# Patient Record
Sex: Female | Born: 1980
Health system: Southern US, Community
[De-identification: ages and names within clinical notes are randomized; demographics above are authoritative.]

## PROBLEM LIST (undated history)

## (undated) DIAGNOSIS — F32A Depression, unspecified: Secondary | ICD-10-CM

## (undated) DIAGNOSIS — Z973 Presence of spectacles and contact lenses: Secondary | ICD-10-CM

## (undated) DIAGNOSIS — F329 Major depressive disorder, single episode, unspecified: Secondary | ICD-10-CM

## (undated) DIAGNOSIS — K219 Gastro-esophageal reflux disease without esophagitis: Secondary | ICD-10-CM

## (undated) DIAGNOSIS — F908 Attention-deficit hyperactivity disorder, other type: Secondary | ICD-10-CM

## (undated) DIAGNOSIS — F419 Anxiety disorder, unspecified: Secondary | ICD-10-CM

## (undated) DIAGNOSIS — I10 Essential (primary) hypertension: Secondary | ICD-10-CM

## (undated) HISTORY — PX: TONSILLECTOMY: SUR1361

## (undated) HISTORY — PX: BREAST REDUCTION SURGERY: SHX8

## (undated) HISTORY — DX: Attention-deficit hyperactivity disorder, other type: F90.8

## (undated) HISTORY — DX: Major depressive disorder, single episode, unspecified: F32.9

## (undated) HISTORY — PX: CHOLECYSTECTOMY: SHX55

## (undated) HISTORY — DX: Anxiety disorder, unspecified: F41.9

## (undated) HISTORY — DX: Depression, unspecified: F32.A

---

## 2013-05-11 ENCOUNTER — Inpatient Hospital Stay: Payer: Self-pay

## 2013-05-11 LAB — CBC WITH DIFFERENTIAL/PLATELET
Basophil %: 0.3 %
Eosinophil #: 0.3 10*3/uL (ref 0.0–0.7)
HCT: 30.9 % — ABNORMAL LOW (ref 35.0–47.0)
HGB: 10.1 g/dL — ABNORMAL LOW (ref 12.0–16.0)
Lymphocyte %: 15.4 %
MCH: 25.7 pg — ABNORMAL LOW (ref 26.0–34.0)
MCHC: 32.6 g/dL (ref 32.0–36.0)
MCV: 79 fL — ABNORMAL LOW (ref 80–100)
Monocyte #: 1 x10 3/mm — ABNORMAL HIGH (ref 0.2–0.9)
Monocyte %: 5.8 %
Neutrophil #: 13.6 10*3/uL — ABNORMAL HIGH (ref 1.4–6.5)
Neutrophil %: 76.8 %
Platelet: 464 10*3/uL — ABNORMAL HIGH (ref 150–440)
RBC: 3.92 10*6/uL (ref 3.80–5.20)

## 2013-05-14 LAB — HEMATOCRIT: HCT: 25.5 % — ABNORMAL LOW (ref 35.0–47.0)

## 2014-11-30 ENCOUNTER — Ambulatory Visit: Payer: Self-pay | Admitting: Family Medicine

## 2014-12-02 ENCOUNTER — Ambulatory Visit: Payer: Self-pay | Admitting: Family Medicine

## 2014-12-04 ENCOUNTER — Other Ambulatory Visit: Payer: Self-pay

## 2014-12-07 ENCOUNTER — Ambulatory Visit (INDEPENDENT_AMBULATORY_CARE_PROVIDER_SITE_OTHER): Payer: 59 | Admitting: Family Medicine

## 2014-12-07 ENCOUNTER — Encounter: Payer: Self-pay | Admitting: Family Medicine

## 2014-12-07 VITALS — BP 120/78 | HR 80 | Ht 64.0 in | Wt 199.0 lb

## 2014-12-07 DIAGNOSIS — F900 Attention-deficit hyperactivity disorder, predominantly inattentive type: Secondary | ICD-10-CM | POA: Diagnosis not present

## 2014-12-07 DIAGNOSIS — F419 Anxiety disorder, unspecified: Secondary | ICD-10-CM | POA: Diagnosis not present

## 2014-12-07 DIAGNOSIS — I10 Essential (primary) hypertension: Secondary | ICD-10-CM

## 2014-12-07 DIAGNOSIS — F909 Attention-deficit hyperactivity disorder, unspecified type: Secondary | ICD-10-CM | POA: Insufficient documentation

## 2014-12-07 MED ORDER — ALPRAZOLAM 0.5 MG PO TABS: 0.5000 mg | ORAL_TABLET | Freq: Every day | ORAL | Status: DC

## 2014-12-07 MED ORDER — ADDERALL XR 15 MG PO CP24: 15.0000 mg | ORAL_CAPSULE | Freq: Every day | ORAL | Status: DC

## 2014-12-07 MED ORDER — LISINOPRIL-HYDROCHLOROTHIAZIDE 10-12.5 MG PO TABS: 1.0000 | ORAL_TABLET | Freq: Every day | ORAL | Status: DC

## 2014-12-07 NOTE — Progress Notes (Signed)
Name: Brenda [name]   MRN: [id number]    DOB: [birth year]   Date:12/07/2014       Progress Note  Subjective  Chief Complaint  Chief Complaint  Patient presents with  . Panic Attack  . ADHD  . Hypertension    Hypertension This is a chronic problem. The current episode started more than 1 year ago. The problem has been resolved since onset. Associated symptoms include anxiety. Pertinent negatives include no blurred vision, chest pain, headaches, malaise/fatigue, neck pain, orthopnea, palpitations, peripheral edema, PND, shortness of breath or sweats. Agents associated with hypertension include amphetamines. Risk factors for coronary artery disease include obesity (lost 42 lbs with dietary modification). Past treatments include angiotensin blockers and diuretics. The current treatment provides moderate improvement. There are no compliance problems.  There is no history of angina, kidney disease or CVA. There is no history of chronic renal disease.  Anxiety Presents for follow-up visit. Onset was 1 to 5 years ago. Symptoms include nervous/anxious behavior. Patient reports no chest pain, depressed mood, excessive worry, insomnia, irritability, palpitations, shortness of breath or suicidal ideas. Symptoms occur occasionally. The severity of symptoms is mild. The symptoms are aggravated by work stress. The quality of sleep is good.   There are no known risk factors. Past treatments include benzodiazephines. Compliance with prior treatments has been good.    No problem-specific assessment & plan notes found for this encounter.   Past Medical History  Diagnosis Date  . Anxiety   . ADHD, adult residual type     Past Surgical History  Procedure Laterality Date  . Breast reduction surgery    . Tonsillectomy      Family History  Problem Relation Age of Onset  . Heart disease Maternal Grandmother   . Heart disease Maternal Grandfather   . Diabetes Paternal Grandfather     History    Social History  . Marital Status: Married    Spouse Name: N/A  . Number of Children: N/A  . Years of Education: N/A   Occupational History  . Not on file.   Social History Main Topics  . Smoking status: Never Smoker   . Smokeless tobacco: Not on file  . Alcohol Use: 0.0 oz/week    0 Standard drinks or equivalent per week  . Drug Use: No  . Sexual Activity: Yes    Birth Control/ Protection: Pill   Other Topics Concern  . Not on file   Social History Narrative  . No narrative on file    No Known Allergies   Review of Systems  Constitutional: Negative for fever, chills, malaise/fatigue and irritability.  Eyes: Negative for blurred vision and photophobia.  Respiratory: Negative for shortness of breath.   Cardiovascular: Negative for chest pain, palpitations, orthopnea and PND.  Gastrointestinal: Negative for heartburn, diarrhea and constipation.  Genitourinary: Negative for dysuria, urgency and frequency.  Musculoskeletal: Negative for myalgias and neck pain.  Skin: Negative for itching and rash.  Neurological: Negative.  Negative for headaches.  Endo/Heme/Allergies: Negative.   Psychiatric/Behavioral: Negative for depression, suicidal ideas and substance abuse. The patient is nervous/anxious. The patient does not have insomnia.      Objective  Filed Vitals:   12/07/14 1018  BP: 120/78  Pulse: 80  Height:  (1.626 m)  Weight: 199 lb (90.266 kg)    Physical Exam  Constitutional: She is oriented to person, place, and time and well-developed, well-nourished, and in no distress. No distress.  HENT:  Head: Normocephalic and atraumatic.  Right Ear: External ear normal.  Left Ear: External ear normal.  Nose: Nose normal.  Mouth/Throat: Oropharynx is clear and moist.  Eyes: Conjunctivae and EOM are normal. Pupils are equal, round, and reactive to light. Left eye exhibits no discharge.  Neck: Normal range of motion. Neck supple. No JVD present. No thyromegaly  present.  Cardiovascular: Normal rate, regular rhythm, normal heart sounds and intact distal pulses.  Exam reveals no gallop and no friction rub.   No murmur heard. Pulmonary/Chest: Effort normal and breath sounds normal. No respiratory distress. She has no wheezes.  Abdominal: Soft. Bowel sounds are normal. She exhibits no distension and no mass. There is no tenderness. There is no rebound and no guarding.  Musculoskeletal: Normal range of motion.  Lymphadenopathy:    She has no cervical adenopathy.  Neurological: She is alert and oriented to person, place, and time. She has normal reflexes.  Skin: Skin is warm and dry. She is not diaphoretic.  Psychiatric: Mood and affect normal.      No results found for this or any previous visit (from the past 2160 hour(s)).   Assessment & Plan  Problem List Items Addressed This Visit      Other   Adult ADHD - Primary   Relevant Medications   ADDERALL XR 15 MG 24 hr capsule    Other Visit Diagnoses    Chronic anxiety        Relevant Medications    ALPRAZolam (XANAX) 0.5 MG tablet    Essential hypertension        Relevant Medications    lisinopril-hydrochlorothiazide (PRINZIDE,ZESTORETIC) 10-12.5 MG per tablet         Dr. Hayden [facility]  12/07/2014

## 2014-12-08 LAB — RENAL FUNCTION PANEL
Albumin: 4.5 g/dL (ref 3.5–5.5)
BUN/Creatinine Ratio: 13 (ref 8–20)
BUN: 10 mg/dL (ref 6–20)
CO2: 23 mmol/L (ref 18–29)
Calcium: 9.6 mg/dL (ref 8.7–10.2)
Chloride: 101 mmol/L (ref 97–108)
Creatinine, Ser: 0.78 mg/dL (ref 0.57–1.00)
GFR, EST AFRICAN AMERICAN: 116 mL/min/{1.73_m2} (ref >59)
GFR, EST NON AFRICAN AMERICAN: 100 mL/min/{1.73_m2} (ref >59)
Glucose: 70 mg/dL (ref 65–99)
POTASSIUM: 4.3 mmol/L (ref 3.5–5.2)
Phosphorus: 2.6 mg/dL (ref 2.5–4.5)
Sodium: 140 mmol/L (ref 134–144)

## 2015-01-01 ENCOUNTER — Ambulatory Visit: Payer: Self-pay | Admitting: Family Medicine

## 2015-01-30 ENCOUNTER — Other Ambulatory Visit: Payer: Self-pay | Admitting: Family Medicine

## 2015-01-30 DIAGNOSIS — I10 Essential (primary) hypertension: Secondary | ICD-10-CM

## 2015-03-11 ENCOUNTER — Other Ambulatory Visit: Payer: Self-pay

## 2015-04-09 ENCOUNTER — Ambulatory Visit (INDEPENDENT_AMBULATORY_CARE_PROVIDER_SITE_OTHER): Payer: 59 | Admitting: Family Medicine

## 2015-04-09 ENCOUNTER — Encounter: Payer: Self-pay | Admitting: Family Medicine

## 2015-04-09 VITALS — BP 114/62 | HR 80 | Ht 64.0 in | Wt 208.0 lb

## 2015-04-09 DIAGNOSIS — I10 Essential (primary) hypertension: Secondary | ICD-10-CM

## 2015-04-09 DIAGNOSIS — F32A Depression, unspecified: Secondary | ICD-10-CM

## 2015-04-09 DIAGNOSIS — Z3041 Encounter for surveillance of contraceptive pills: Secondary | ICD-10-CM

## 2015-04-09 DIAGNOSIS — F900 Attention-deficit hyperactivity disorder, predominantly inattentive type: Secondary | ICD-10-CM | POA: Diagnosis not present

## 2015-04-09 DIAGNOSIS — F909 Attention-deficit hyperactivity disorder, unspecified type: Secondary | ICD-10-CM

## 2015-04-09 DIAGNOSIS — F419 Anxiety disorder, unspecified: Secondary | ICD-10-CM | POA: Diagnosis not present

## 2015-04-09 DIAGNOSIS — F329 Major depressive disorder, single episode, unspecified: Secondary | ICD-10-CM

## 2015-04-09 MED ORDER — ESCITALOPRAM OXALATE 20 MG PO TABS: 20.0000 mg | ORAL_TABLET | Freq: Every day | ORAL | Status: DC

## 2015-04-09 MED ORDER — LISINOPRIL-HYDROCHLOROTHIAZIDE 10-12.5 MG PO TABS: 1.0000 | ORAL_TABLET | Freq: Every day | ORAL | Status: DC

## 2015-04-09 MED ORDER — AMPHETAMINE-DEXTROAMPHETAMINE 20 MG PO TABS: 20.0000 mg | ORAL_TABLET | Freq: Two times a day (BID) | ORAL | Status: DC

## 2015-04-09 MED ORDER — ALPRAZOLAM 0.5 MG PO TABS: 0.5000 mg | ORAL_TABLET | Freq: Every day | ORAL | Status: DC

## 2015-04-09 MED ORDER — JOLIVETTE 0.35 MG PO TABS: 1.0000 | ORAL_TABLET | Freq: Every day | ORAL | Status: DC

## 2015-04-09 NOTE — Progress Notes (Signed)
Name: Brenda HalstedVirginia Tate Dimick   MRN: [id number]    DOB: 03/18/1981   Date:04/09/2015       Progress Note  Subjective  Chief Complaint  Chief Complaint  Patient presents with  . ADHD  . Anxiety  . Hypertension    HPI Comments: Follow up ADHD  Anxiety Presents for follow-up visit. Symptoms include nervous/anxious behavior. Patient reports no chest pain, depressed mood, dizziness, excessive worry, insomnia, nausea, palpitations, shortness of breath or suicidal ideas. Symptoms occur most days. The severity of symptoms is mild. The symptoms are aggravated by work stress.   Her past medical history is significant for anxiety/panic attacks.  Hypertension This is a chronic problem. The current episode started more than 1 year ago. The problem has been waxing and waning since onset. Associated symptoms include anxiety. Pertinent negatives include no blurred vision, chest pain, headaches, malaise/fatigue, neck pain, palpitations or shortness of breath. Agents associated with hypertension include amphetamines. Past treatments include ACE inhibitors. There is no history of angina, kidney disease, CAD/MI, CVA, heart failure, left ventricular hypertrophy, PVD, renovascular disease or retinopathy. There is no history of chronic renal disease.    No problem-specific assessment & plan notes found for this encounter.   Past Medical History  Diagnosis Date  . Anxiety   . ADHD, adult residual type   . Depression     Past Surgical History  Procedure Laterality Date  . Breast reduction surgery    . Tonsillectomy      Family History  Problem Relation Age of Onset  . Heart disease Maternal Grandmother   . Heart disease Maternal Grandfather   . Diabetes Paternal Grandfather     Social History   Social History  . Marital Status: Married    Spouse Name: N/A  . Number of Children: N/A  . Years of Education: N/A   Occupational History  . Not on file.   Social History Main Topics  . Smoking  status: Never Smoker   . Smokeless tobacco: Not on file  . Alcohol Use: 0.0 oz/week    0 Standard drinks or equivalent per week  . Drug Use: No  . Sexual Activity: Yes    Birth Control/ Protection: Pill   Other Topics Concern  . Not on file   Social History Narrative    No Known Allergies   Review of Systems  Constitutional: Negative for fever, chills, weight loss and malaise/fatigue.  HENT: Negative for ear discharge, ear pain and sore throat.   Eyes: Negative for blurred vision.  Respiratory: Negative for cough, sputum production, shortness of breath and wheezing.   Cardiovascular: Negative for chest pain, palpitations and leg swelling.  Gastrointestinal: Negative for heartburn, nausea, abdominal pain, diarrhea, constipation, blood in stool and melena.  Genitourinary: Negative for dysuria, urgency, frequency and hematuria.  Musculoskeletal: Negative for myalgias, back pain, joint pain and neck pain.  Skin: Negative for rash.  Neurological: Negative for dizziness, tingling, sensory change, focal weakness and headaches.  Endo/Heme/Allergies: Negative for environmental allergies and polydipsia. Does not bruise/bleed easily.  Psychiatric/Behavioral: Negative for depression and suicidal ideas. The patient is nervous/anxious. The patient does not have insomnia.      Objective  Filed Vitals:   04/09/15 1451  BP: 114/62  Pulse: 80  Height: 5\' 4"  (1.626 m)  Weight: 208 lb (94.348 kg)    Physical Exam  Constitutional: She is well-developed, well-nourished, and in no distress. No distress.  HENT:  Head: Normocephalic and atraumatic.  Right Ear: External ear normal.  Left Ear: External ear normal.  Nose: Nose normal.  Mouth/Throat: Oropharynx is clear and moist.  Eyes: Conjunctivae and EOM are normal. Pupils are equal, round, and reactive to light. Right eye exhibits no discharge. Left eye exhibits no discharge.  Neck: Normal range of motion. Neck supple. No JVD present. No  thyromegaly present.  Cardiovascular: Normal rate, regular rhythm, normal heart sounds and intact distal pulses.  Exam reveals no gallop and no friction rub.   No murmur heard. Pulmonary/Chest: Effort normal and breath sounds normal.  Abdominal: Soft. Bowel sounds are normal. She exhibits no mass. There is no tenderness. There is no guarding.  Musculoskeletal: Normal range of motion. She exhibits no edema.  Lymphadenopathy:    She has no cervical adenopathy.  Neurological: She is alert. She has normal reflexes.  Skin: Skin is warm and dry. She is not diaphoretic.  Psychiatric: Mood and affect normal.      Assessment & Plan  Problem List Items Addressed This Visit      Other   Adult ADHD - Primary   Relevant Medications   amphetamine-dextroamphetamine (ADDERALL) 20 MG tablet    Other Visit Diagnoses    Acute anxiety        Relevant Medications    ALPRAZolam (XANAX) 0.5 MG tablet    escitalopram (LEXAPRO) 20 MG tablet    Essential hypertension        Relevant Medications    lisinopril-hydrochlorothiazide (PRINZIDE,ZESTORETIC) 10-12.5 MG tablet    Depression        Relevant Medications    ALPRAZolam (XANAX) 0.5 MG tablet    escitalopram (LEXAPRO) 20 MG tablet    Chronic anxiety        Relevant Medications    ALPRAZolam (XANAX) 0.5 MG tablet    escitalopram (LEXAPRO) 20 MG tablet    Encounter for surveillance of contraceptive pills        Relevant Medications    JOLIVETTE 0.35 MG tablet         Dr. [facility]den [facility]  04/09/2015

## 2015-05-06 ENCOUNTER — Other Ambulatory Visit: Payer: Self-pay

## 2015-06-07 ENCOUNTER — Other Ambulatory Visit: Payer: Self-pay

## 2015-06-25 ENCOUNTER — Encounter: Payer: Self-pay | Admitting: Family Medicine

## 2015-06-25 ENCOUNTER — Ambulatory Visit (INDEPENDENT_AMBULATORY_CARE_PROVIDER_SITE_OTHER): Payer: 59 | Admitting: Family Medicine

## 2015-06-25 VITALS — BP 120/80 | HR 78 | Ht 64.0 in | Wt 208.0 lb

## 2015-06-25 DIAGNOSIS — J0100 Acute maxillary sinusitis, unspecified: Secondary | ICD-10-CM

## 2015-06-25 DIAGNOSIS — J40 Bronchitis, not specified as acute or chronic: Secondary | ICD-10-CM | POA: Diagnosis not present

## 2015-06-25 MED ORDER — AMOXICILLIN 500 MG PO CAPS: 500.0000 mg | ORAL_CAPSULE | Freq: Three times a day (TID) | ORAL | Status: DC

## 2015-06-25 MED ORDER — BENZONATATE 100 MG PO CAPS: 100.0000 mg | ORAL_CAPSULE | Freq: Two times a day (BID) | ORAL | Status: DC | PRN

## 2015-06-25 NOTE — Progress Notes (Signed)
Name: Rae HalstedVirginia Tate Dimick   MRN: [id number]    DOB: 09/30/1980   Date:06/25/2015       Progress Note  Subjective  Chief Complaint  Chief Complaint  Patient presents with  . Sinusitis    cong and drainage- started Monday- daughter has been sick    Sinusitis This is a new problem. The current episode started in the past 7 days. The problem has been waxing and waning since onset. The maximum temperature recorded prior to her arrival was 100.4 - 100.9 F. Her pain is at a severity of 5/10. The pain is moderate. Associated symptoms include chills, congestion, coughing, diaphoresis, headaches, a hoarse voice, sinus pressure, a sore throat and swollen glands. Pertinent negatives include no ear pain, neck pain, shortness of breath or sneezing. Past treatments include acetaminophen. The treatment provided moderate relief.    No problem-specific assessment & plan notes found for this encounter.   Past Medical History  Diagnosis Date  . Anxiety   . ADHD, adult residual type   . Depression     Past Surgical History  Procedure Laterality Date  . Breast reduction surgery    . Tonsillectomy      Family History  Problem Relation Age of Onset  . Heart disease Maternal Grandmother   . Heart disease Maternal Grandfather   . Diabetes Paternal Grandfather     Social History   Social History  . Marital Status: Married    Spouse Name: N/A  . Number of Children: N/A  . Years of Education: N/A   Occupational History  . Not on file.   Social History Main Topics  . Smoking status: Never Smoker   . Smokeless tobacco: Not on file  . Alcohol Use: 0.0 oz/week    0 Standard drinks or equivalent per week  . Drug Use: No  . Sexual Activity: Yes    Birth Control/ Protection: Pill   Other Topics Concern  . Not on file   Social History Narrative    No Known Allergies   Review of Systems  Constitutional: Positive for chills and diaphoresis. Negative for fever, weight loss and  malaise/fatigue.  HENT: Positive for congestion, hoarse voice, sinus pressure and sore throat. Negative for ear discharge, ear pain and sneezing.   Eyes: Negative for blurred vision.  Respiratory: Positive for cough. Negative for sputum production, shortness of breath and wheezing.   Cardiovascular: Negative for chest pain, palpitations and leg swelling.  Gastrointestinal: Negative for heartburn, nausea, abdominal pain, diarrhea, constipation, blood in stool and melena.  Genitourinary: Negative for dysuria, urgency, frequency and hematuria.  Musculoskeletal: Negative for myalgias, back pain, joint pain and neck pain.  Skin: Negative for rash.  Neurological: Positive for headaches. Negative for dizziness, tingling, sensory change and focal weakness.  Endo/Heme/Allergies: Negative for environmental allergies and polydipsia. Does not bruise/bleed easily.  Psychiatric/Behavioral: Negative for depression and suicidal ideas. The patient is not nervous/anxious and does not have insomnia.      Objective  Filed Vitals:   06/25/15 1016  BP: 120/80  Pulse: 78  Height: 5\' 4"  (1.626 m)  Weight: 208 lb (94.348 kg)    Physical Exam  Constitutional: She is well-developed, well-nourished, and in no distress. No distress.  HENT:  Head: Normocephalic and atraumatic.  Right Ear: External ear normal.  Left Ear: External ear normal.  Nose: Nose normal.  Mouth/Throat: Oropharynx is clear and moist.  Eyes: Conjunctivae and EOM are normal. Pupils are equal, round, and reactive to light. Right eye  exhibits no discharge. Left eye exhibits no discharge.  Neck: Normal range of motion. Neck supple. No JVD present. No thyromegaly present.  Cardiovascular: Normal rate, regular rhythm, normal heart sounds and intact distal pulses.  Exam reveals no gallop and no friction rub.   No murmur heard. Pulmonary/Chest: Effort normal and breath sounds normal.  Abdominal: Soft. Bowel sounds are normal. She exhibits no  mass. There is no tenderness. There is no guarding.  Musculoskeletal: Normal range of motion. She exhibits no edema.  Lymphadenopathy:    She has no cervical adenopathy.  Neurological: She is alert. She has normal reflexes.  Skin: Skin is warm and dry. She is not diaphoretic.  Psychiatric: Mood and affect normal.  Nursing note and vitals reviewed.     Assessment & Plan  Problem List Items Addressed This Visit    None    Visit Diagnoses    Acute maxillary sinusitis, recurrence not specified    -  Primary    Relevant Medications    benzonatate (TESSALON) 100 MG capsule    amoxicillin (AMOXIL) 500 MG capsule    Bronchitis        Relevant Medications    amoxicillin (AMOXIL) 500 MG capsule         Dr. [facility]  06/25/2015

## 2015-11-05 ENCOUNTER — Other Ambulatory Visit: Payer: Self-pay | Admitting: Family Medicine

## 2015-12-05 ENCOUNTER — Other Ambulatory Visit: Payer: Self-pay | Admitting: Family Medicine

## 2015-12-17 ENCOUNTER — Ambulatory Visit (INDEPENDENT_AMBULATORY_CARE_PROVIDER_SITE_OTHER): Payer: 59 | Admitting: Family Medicine

## 2015-12-17 ENCOUNTER — Encounter: Payer: Self-pay | Admitting: Family Medicine

## 2015-12-17 VITALS — BP 130/100 | HR 80 | Ht 64.0 in | Wt 226.0 lb

## 2015-12-17 DIAGNOSIS — J0100 Acute maxillary sinusitis, unspecified: Secondary | ICD-10-CM

## 2015-12-17 DIAGNOSIS — T783XXA Angioneurotic edema, initial encounter: Secondary | ICD-10-CM | POA: Diagnosis not present

## 2015-12-17 DIAGNOSIS — I10 Essential (primary) hypertension: Secondary | ICD-10-CM | POA: Diagnosis not present

## 2015-12-17 DIAGNOSIS — L309 Dermatitis, unspecified: Secondary | ICD-10-CM

## 2015-12-17 DIAGNOSIS — H109 Unspecified conjunctivitis: Secondary | ICD-10-CM | POA: Diagnosis not present

## 2015-12-17 MED ORDER — HYDROCHLOROTHIAZIDE 12.5 MG PO TABS: 12.5000 mg | ORAL_TABLET | Freq: Every day | ORAL | Status: DC

## 2015-12-17 MED ORDER — PREDNISONE 10 MG PO TABS: 10.0000 mg | ORAL_TABLET | Freq: Every day | ORAL | Status: DC

## 2015-12-17 MED ORDER — CLOTRIMAZOLE-BETAMETHASONE 1-0.05 % EX CREA: 1.0000 "application " | TOPICAL_CREAM | Freq: Two times a day (BID) | CUTANEOUS | Status: DC

## 2015-12-17 MED ORDER — DOXYCYCLINE HYCLATE 100 MG PO TABS: 100.0000 mg | ORAL_TABLET | Freq: Two times a day (BID) | ORAL | Status: DC

## 2015-12-17 NOTE — Patient Instructions (Signed)
Angioedema  Angioedema is a sudden swelling of tissues, often of the skin. It can occur on the face or genitals or in the abdomen or other body parts. The swelling usually develops over a short period and gets better in 24 to 48 hours. It often begins during the night and is found when the person wakes up. The person may also get red, itchy patches of skin (hives). Angioedema can be dangerous if it involves swelling of the air passages.   Depending on the cause, episodes of angioedema may only happen once, come back in unpredictable patterns, or repeat for several years and then gradually fade away.   CAUSES   Angioedema can be caused by an allergic reaction to various triggers. It can also result from nonallergic causes, including reactions to drugs, immune system disorders, viral infections, or an abnormal gene that is passed to you from your parents (hereditary). For some people with angioedema, the cause is unknown.   Some things that can trigger angioedema include:    Foods.    Medicines, such as ACE inhibitors, ARBs, nonsteroidal anti-inflammatory agents, or estrogen.    Latex.    Animal saliva.    Insect stings.    Dyes used in X-rays.    Mild injury.    Dental work.   Surgery.   Stress.    Sudden changes in temperature.    Exercise.  SIGNS AND SYMPTOMS    Swelling of the skin.   Hives. If these are present, there is also intense itching.   Redness in the affected area.    Pain in the affected area.   Swollen lips or tongue.   Breathing problems. This may happen if the air passages swell.   Wheezing.  If internal organs are involved, there may be:    Nausea.    Abdominal pain.    Vomiting.    Difficulty swallowing.    Difficulty passing urine.  DIAGNOSIS    Your health care provider will examine the affected area and take a medical and family history.   Various tests may be done to help determine the cause. Tests may include:   Allergy skin tests to see if the problem  is an allergic reaction.    Blood tests to check for hereditary angioedema.    Tests to check for underlying diseases that could cause the condition.    A review of your medicines, including over-the-counter medicines, may be done.  TREATMENT   Treatment will depend on the cause of the angioedema. Possible treatments include:    Removal of anything that triggered the condition (such as stopping certain medicines).    Medicines to treat symptoms or prevent attacks. Medicines given may include:     Antihistamines.     Epinephrine injection.     Steroids.    Hospitalization may be required for severe attacks. If the air passages are affected, it can be an emergency. Tubes may need to be placed to keep the airway open.  HOME CARE INSTRUCTIONS    Take all medicines as directed by your health care provider.   If you were given medicines for emergency allergy treatment, always carry them with you.   Wear a medical bracelet as directed by your health care provider.    Avoid known triggers.  SEEK MEDICAL CARE IF:    You have repeat attacks of angioedema.    Your attacks are more frequent or more severe despite preventive measures.    You have hereditary angioedema   and are considering having children. It is important to discuss with your health care provider the risks of passing the condition on to your children.  SEEK IMMEDIATE MEDICAL CARE IF:    You have severe swelling of the mouth, tongue, or lips.   You have difficulty breathing.    You have difficulty swallowing.    You faint.  MAKE SURE YOU:   Understand these instructions.   Will watch your condition.   Will get help right away if you are not doing well or get worse.     This information is not intended to replace advice given to you by your health care provider. Make sure you discuss any questions you have with your health care provider.     Document Released: 08/21/2001 Document Revised: 07/03/2014 Document Reviewed:  02/03/2013  [employer] Interactive Patient Education 2016 [employer]

## 2015-12-17 NOTE — Progress Notes (Signed)
Name: Brenda [name]: [id number]    DOB: 05/31/1981   Date:12/17/2015       Progress Note  Subjective  Chief Complaint  Chief Complaint  Patient presents with  . Sinusitis    cough and cong- wants Tessalon Perles instead of cough syrup  . Allergic Reaction    lip and face swelling/ taking Lisinopril    Sinusitis This is a new problem. The current episode started in the past 7 days. The problem has been gradually worsening since onset. There has been no fever. Associated symptoms include chills, congestion, sinus pressure and a sore throat. Pertinent negatives include no coughing, ear pain, headaches, neck pain or shortness of breath. Past treatments include acetaminophen. The treatment provided no relief.  Allergic Reaction This is a new problem. The current episode started more than 1 week ago. The problem has been waxing and waning since onset. The problem is mild. The patient was exposed to an antibiotic. Associated symptoms include eye redness. Pertinent negatives include no abdominal pain, chest pain, chest pressure, coughing, diarrhea, difficulty breathing, drooling, eye itching, eye watering, hyperventilation, itching, rash, stridor, trouble swallowing or wheezing. Swelling is present on the lips. The treatment provided mild relief.    No problem-specific assessment & plan notes found for this encounter.   Past Medical History  Diagnosis Date  . Anxiety   . ADHD, adult residual type   . Depression     Past Surgical History  Procedure Laterality Date  . Breast reduction surgery    . Tonsillectomy      Family History  Problem Relation Age of Onset  . Heart disease Maternal Grandmother   . Heart disease Maternal Grandfather   . Diabetes Paternal Grandfather     Social History   Social History  . Marital Status: Married    Spouse Name: N/A  . Number of Children: N/A  . Years of Education: N/A   Occupational History  . Not on file.   Social History  Main Topics  . Smoking status: Never Smoker   . Smokeless tobacco: Not on file  . Alcohol Use: 0.0 oz/week    0 Standard drinks or equivalent per week  . Drug Use: No  . Sexual Activity: Yes    Birth Control/ Protection: Pill   Other Topics Concern  . Not on file   Social History Narrative    Allergies  Allergen Reactions  . Ace Inhibitors Swelling    Lips/ face     Review of Systems  Constitutional: Positive for chills. Negative for fever, weight loss and malaise/fatigue.  HENT: Positive for congestion, sinus pressure and sore throat. Negative for drooling, ear discharge, ear pain and trouble swallowing.   Eyes: Positive for redness. Negative for blurred vision and itching.  Respiratory: Negative for cough, sputum production, shortness of breath, wheezing and stridor.   Cardiovascular: Negative for chest pain, palpitations and leg swelling.  Gastrointestinal: Negative for heartburn, nausea, abdominal pain, diarrhea, constipation, blood in stool and melena.  Genitourinary: Negative for dysuria, urgency, frequency and hematuria.  Musculoskeletal: Negative for myalgias, back pain, joint pain and neck pain.  Skin: Negative for itching and rash.  Neurological: Negative for dizziness, tingling, sensory change, focal weakness and headaches.  Endo/Heme/Allergies: Negative for environmental allergies and polydipsia. Does not bruise/bleed easily.  Psychiatric/Behavioral: Negative for depression and suicidal ideas. The patient is not nervous/anxious and does not have insomnia.      Objective  Filed Vitals:   12/17/15 1455  BP: 130/100  Pulse: 80  Height: 5\' 4"  (1.626 m)  Weight: 226 lb (102.513 kg)    Physical Exam  Constitutional: She is well-developed, well-nourished, and in no distress. No distress.  HENT:  Head: Normocephalic and atraumatic.  Right Ear: External ear normal.  Left Ear: External ear normal.  Nose: Nose normal.  Mouth/Throat: Oropharynx is clear and  moist.  Eyes: Conjunctivae and EOM are normal. Pupils are equal, round, and reactive to light. Right eye exhibits no discharge. Left eye exhibits no discharge.  Neck: Normal range of motion. Neck supple. No JVD present. No thyromegaly present.  Cardiovascular: Normal rate, regular rhythm, normal heart sounds and intact distal pulses.  Exam reveals no gallop and no friction rub.   No murmur heard. Pulmonary/Chest: Effort normal and breath sounds normal.  Abdominal: Soft. Bowel sounds are normal. She exhibits no mass. There is no tenderness. There is no guarding.  Musculoskeletal: Normal range of motion. She exhibits no edema.  Lymphadenopathy:    She has no cervical adenopathy.  Neurological: She is alert. She has normal reflexes.  Skin: Skin is warm and dry. Rash noted. She is not diaphoretic.  Psychiatric: Mood and affect normal.  Nursing note and vitals reviewed.     Assessment & Plan  Problem List Items Addressed This Visit    None    Visit Diagnoses    Angioedema, initial encounter    -  Primary    likely lisinopril/ obtain Biotin    Eczema        Relevant Medications    clotrimazole-betamethasone (LOTRISONE) cream    Acute maxillary sinusitis, recurrence not specified        Relevant Medications    doxycycline (VIBRA-TABS) 100 MG tablet    predniSONE (DELTASONE) 10 MG tablet    Bilateral conjunctivitis        Essential hypertension        Relevant Medications    hydrochlorothiazide (HYDRODIURIL) 12.5 MG tablet         Dr. Hayden Rasmusseneanna Henrine Hayter [facility]  12/17/2015

## 2016-01-04 ENCOUNTER — Other Ambulatory Visit: Payer: Self-pay | Admitting: Family Medicine

## 2016-02-02 ENCOUNTER — Other Ambulatory Visit: Payer: Self-pay

## 2016-02-02 MED ORDER — CLOTRIMAZOLE-BETAMETHASONE 1-0.05 % EX CREA: 1.0000 "application " | TOPICAL_CREAM | Freq: Two times a day (BID) | CUTANEOUS | 0 refills | Status: DC

## 2016-02-08 ENCOUNTER — Other Ambulatory Visit: Payer: Self-pay | Admitting: Family Medicine

## 2016-02-08 DIAGNOSIS — I10 Essential (primary) hypertension: Secondary | ICD-10-CM

## 2016-02-27 ENCOUNTER — Other Ambulatory Visit: Payer: Self-pay | Admitting: Family Medicine

## 2016-08-09 ENCOUNTER — Ambulatory Visit: Payer: 59 | Admitting: Family Medicine

## 2016-09-11 ENCOUNTER — Ambulatory Visit: Payer: 59 | Admitting: Obstetrics and Gynecology

## 2016-09-12 ENCOUNTER — Ambulatory Visit (INDEPENDENT_AMBULATORY_CARE_PROVIDER_SITE_OTHER): Payer: 59 | Admitting: Obstetrics and Gynecology

## 2016-09-12 ENCOUNTER — Encounter: Payer: Self-pay | Admitting: Obstetrics and Gynecology

## 2016-09-12 DIAGNOSIS — K219 Gastro-esophageal reflux disease without esophagitis: Secondary | ICD-10-CM | POA: Diagnosis not present

## 2016-09-12 DIAGNOSIS — Z124 Encounter for screening for malignant neoplasm of cervix: Secondary | ICD-10-CM | POA: Diagnosis not present

## 2016-09-12 DIAGNOSIS — Z6841 Body Mass Index (BMI) 40.0 and over, adult: Secondary | ICD-10-CM | POA: Diagnosis not present

## 2016-09-12 DIAGNOSIS — F411 Generalized anxiety disorder: Secondary | ICD-10-CM | POA: Diagnosis not present

## 2016-09-12 DIAGNOSIS — Z0000 Encounter for general adult medical examination without abnormal findings: Secondary | ICD-10-CM | POA: Diagnosis not present

## 2016-09-12 DIAGNOSIS — Z01419 Encounter for gynecological examination (general) (routine) without abnormal findings: Secondary | ICD-10-CM | POA: Diagnosis not present

## 2016-09-12 MED ORDER — ESCITALOPRAM OXALATE 20 MG PO TABS: 20.0000 mg | ORAL_TABLET | Freq: Every day | ORAL | 2 refills | Status: DC

## 2016-09-12 MED ORDER — LORCASERIN HCL ER 20 MG PO TB24: 1.0000 | ORAL_TABLET | Freq: Every day | ORAL | 2 refills | Status: DC

## 2016-09-12 MED ORDER — RANITIDINE HCL 150 MG PO TABS: 150.0000 mg | ORAL_TABLET | Freq: Two times a day (BID) | ORAL | 11 refills | Status: DC

## 2016-09-12 MED ORDER — HYDROXYZINE HCL 50 MG PO TABS: 50.0000 mg | ORAL_TABLET | Freq: Four times a day (QID) | ORAL | 3 refills | Status: DC | PRN

## 2016-09-12 MED ORDER — NORETHINDRONE 0.35 MG PO TABS: 1.0000 | ORAL_TABLET | Freq: Every day | ORAL | 11 refills | Status: DC

## 2016-09-12 NOTE — Progress Notes (Signed)
Patient ID: Brenda [name], female   DOB: 08/22/1980, 36 y.o.   MRN: [id number]     Gynecology Annual Exam  PCP: Elizabeth Sauer, MD  Chief Complaint:  Chief Complaint  Patient presents with  . Gynecologic Exam  . Obesity    ?? lap-band    History of Present Illness: Patient is a 36 y.o. G2P1011 presents for annual exam. The patient has no complaints today.   LMP: No LMP recorded. Patient is not currently having periods (Reason: Oral contraceptives). Amenorrhea on norethindrone Dysmenorrhea: no  The patient is sexually active. She currently uses Oral contraceptives: Present: yes for contraception. She denies dyspareunia.  The patient does perform self breast exams.  There is no notable family history of breast or ovarian cancer in her family.  The patient wears seatbelts: yes.   The patient has regular exercise: no.    The patient denies current symptoms of depression.     Patientis a 36 y.o. G56P1011 female, who presents for the evaluation of weight gain. She has gained 10 pounds primarily over past year (was on seroquel). The patient states the following issues have contributed to her weight problem: inactivity, on the road a lot and poor diet choices.  The patient has no additional symptoms. The patient specifically denies memory loss, muscle weakness, excessive thirst, and polyuria. Weight related co-morbidities include HTN. The patient's past medical history is notable for HTN, ADHD on aderal currently. She has tried phentermine interventions in the past with modest success.   Review of Systems: Review of Systems  Constitutional: Negative for chills and fever.  HENT: Negative for congestion.   Respiratory: Negative for cough and shortness of breath.   Cardiovascular: Negative for chest pain and palpitations.  Gastrointestinal: Negative for abdominal pain, constipation, diarrhea, heartburn, nausea and vomiting.  Genitourinary: Negative for dysuria, frequency and urgency.    Skin: Negative for itching and rash.  Neurological: Negative for dizziness and headaches.  Endo/Heme/Allergies: Negative for polydipsia.  Psychiatric/Behavioral: Negative for depression, hallucinations and suicidal ideas. The patient is nervous/anxious. The patient does not have insomnia.     Past Medical History:  Past Medical History:  Diagnosis Date  . ADHD, adult residual type   . Anxiety   . Depression     Past Surgical History:  Past Surgical History:  Procedure Laterality Date  . BREAST REDUCTION SURGERY    . TONSILLECTOMY      Gynecologic History:  No LMP recorded. Patient is not currently having periods (Reason: Oral contraceptives). Contraception: oral progesterone-only contraceptive Last Pap: Results were: NIL and HR HPV negative   Obstetric History: G2P1011  Family History:  Family History  Problem Relation Age of Onset  . Heart disease Maternal Grandmother   . Heart disease Maternal Grandfather   . Diabetes Paternal Grandfather     Social History:  Social History   Social History  . Marital status: Married    Spouse name: N/A  . Number of children: N/A  . Years of education: N/A   Occupational History  . Not on file.   Social History Main Topics  . Smoking status: Never Smoker  . Smokeless tobacco: Never Used  . Alcohol use 0.0 oz/week  . Drug use: No  . Sexual activity: Yes    Birth control/ protection: Pill   Other Topics Concern  . Not on file   Social History Narrative  . No narrative on file    Allergies:  Allergies  Allergen Reactions  . Ace Inhibitors Swelling  Lips/ face    Medications: Prior to Admission medications   Medication Sig Start Date End Date Taking? Authorizing Provider  ADDERALL XR 20 MG 24 hr capsule Take 1 capsule by mouth daily. Dr Evelene Croon 11/23/15   Historical Provider, MD  amphetamine-dextroamphetamine (ADDERALL) 20 MG tablet Take 1 tablet (20 mg total) by mouth 2 (two) times daily. 04/09/15   Duanne Limerick, MD  hydrochlorothiazide (HYDRODIURIL) 12.5 MG tablet Take 1 tablet (12.5 mg total) by mouth daily. 12/17/15   Duanne Limerick, MD  JOLIVETTE 0.35 MG tablet TAKE 1 TABLET BY MOUTH EVERY DAY 02/29/16   Duanne Limerick, MD    Physical Exam Vitals: Blood pressure (!) 142/98, pulse 62, height 5\' 4"  (1.626 m), weight 238 lb (108 kg).[+10lbs] Body mass index is 40.85 kg/m.   General: NAD HEENT: normocephalic, anicteric Thyroid: no enlargement, no palpable nodules Pulmonary: No increased work of breathing, CTAB Cardiovascular: RRR, distal pulses 2+ Breast: Breast symmetrical, no tenderness, no palpable nodules or masses, no skin or nipple retraction present, no nipple discharge.  No axillary or supraclavicular lymphadenopathy. Abdomen: NABS, soft, non-tender, non-distended.  Umbilicus without lesions.  No hepatomegaly, splenomegaly or masses palpable. No evidence of hernia  Genitourinary:  External: Normal external female genitalia.  Normal urethral meatus, normal  Bartholin's and Skene's glands.    Vagina: Normal vaginal mucosa, no evidence of prolapse.    Cervix: Grossly normal in appearance, no bleeding  Uterus: Non-enlarged, mobile, normal contour.  No CMT  Adnexa: ovaries non-enlarged, no adnexal masses  Rectal: deferred  Lymphatic: no evidence of inguinal lymphadenopathy Extremities: no edema, erythema, or tenderness Neurologic: Grossly intact Psychiatric: mood appropriate, affect full  Female chaperone present for pelvic and breast  portions of the physical exam    Assessment: 36 y.o. O9G2952 No problem-specific Assessment & Plan notes found for this encounter.   Plan: Problem List Items Addressed This Visit    None    Visit Diagnoses    Morbid obesity (HCC)    -  Primary   Relevant Medications   Lorcaserin HCl ER (BELVIQ XR) 20 MG TB24   Other Relevant Orders   Ambulatory referral to General Surgery   BMI 40.0-44.9, adult (HCC)       Relevant Medications    Lorcaserin HCl ER (BELVIQ XR) 20 MG TB24   Other Relevant Orders   Ambulatory referral to General Surgery   Generalized anxiety disorder       Gastroesophageal reflux disease, esophagitis presence not specified       Relevant Medications   ranitidine (ZANTAC) 150 MG tablet   Screening for malignant neoplasm of cervix       Relevant Orders   PapIG, HPV, rfx 16/18   Encounter for gynecological examination without abnormal finding       Relevant Orders   PapIG, HPV, rfx 16/18     Annual 1) STI screening was offered and declined  2) ASCCP guidelines and rational discussed.  Patient opts for every 5 years screening interval  3) Contraception - Education given regarding options for contraception, including oral contraceptives.  4) Routine healthcare maintenance including cholesterol, diabetes screening discussed managed by PCP  5) Start Zantac for GERD  5) PHQ-9 is 11 item 9 is 0, GAD-7 16 increase lexapro to 20mg  add prn vistaril  6) Follow up 1 year for routine annual exam  Weight loss 1) 1500 Calorie ADA Diet  2) Patient education given regarding appropriate lifestyle changes for weight loss including: regular physical  activity, healthy coping strategies, caloric restriction and healthy eating patterns.  3) Patient will be started on weight loss medication. The risks and benefits and side effects of medication, such as Adipex (Phenteramine) ,  Tenuate (Diethylproprion), Belviq (lorcarsin), Contrave (buproprion/naltrexone), Qsymia (phentermine/topiramate), and Saxenda (liraglutide) is discussed. The pros and cons of suppressing appetite and boosting metabolism is discussed. Risks of tolerence and addiction is discussed for selected agents discussed. Use of medicine will ne short term, such as 3-4 months at a time followed by a period of time off of the medicine to avoid these risks and side effects for Adipex, Qsymia, and Tenuate discussed. Pt to call with any negative side effects and  agrees to keep follow up appts.  Started on Belviq today referral to bariatric surgery  4) Comorbidity Screening - hypothyroidism screening, diabetes, and hyperlipidemia screening offered  5) Encouraged weekly weight monitorig to track progress and sample 1 week food diary  6) Contraception - discussed that all weight loss drugs fall in to pregnancy category X, patient currently has reliable contraception in the form of norethindrone  7) 15 minutes face-to-face; counseling/coordination of care > 50 percent of visit  8) Follow up in 12 weeks to assess response

## 2016-09-12 NOTE — Patient Instructions (Addendum)
Calorie Counting for Weight Loss Calories are units of energy. Your body needs a certain amount of calories from food to keep you going throughout the day. When you eat more calories than your body needs, your body stores the extra calories as fat. When you eat fewer calories than your body needs, your body burns fat to get the energy it needs. Calorie counting means keeping track of how many calories you eat and drink each day. Calorie counting can be helpful if you need to lose weight. If you make sure to eat fewer calories than your body needs, you should lose weight. Ask your health care provider what a healthy weight is for you. For calorie counting to work, you will need to eat the right number of calories in a day in order to lose a healthy amount of weight per week. A dietitian can help you determine how many calories you need in a day and will give you suggestions on how to reach your calorie goal.  A healthy amount of weight to lose per week is usually 1-2 lb (0.5-0.9 kg). This usually means that your daily calorie intake should be reduced by 500-750 calories.  Eating 1,200 - 1,500 calories per day can help most women lose weight.  Eating 1,500 - 1,800 calories per day can help most men lose weight. What is my plan? My goal is to have __________ calories per day. If I have this many calories per day, I should lose around __________ pounds per week. What do I need to know about calorie counting? In order to meet your daily calorie goal, you will need to:  Find out how many calories are in each food you would like to eat. Try to do this before you eat.  Decide how much of the food you plan to eat.  Write down what you ate and how many calories it had. Doing this is called keeping a food log. To successfully lose weight, it is important to balance calorie counting with a healthy lifestyle that includes regular activity. Aim for 150 minutes of moderate exercise (such as walking) or 75  minutes of vigorous exercise (such as running) each week. Where do I find calorie information?   The number of calories in a food can be found on a Nutrition Facts label. If a food does not have a Nutrition Facts label, try to look up the calories online or ask your dietitian for help. Remember that calories are listed per serving. If you choose to have more than one serving of a food, you will have to multiply the calories per serving by the amount of servings you plan to eat. For example, the label on a package of bread might say that a serving size is 1 slice and that there are 90 calories in a serving. If you eat 1 slice, you will have eaten 90 calories. If you eat 2 slices, you will have eaten 180 calories. How do I keep a food log? Immediately after each meal, record the following information in your food log:  What you ate. Don't forget to include toppings, sauces, and other extras on the food.  How much you ate. This can be measured in cups, ounces, or number of items.  How many calories each food and drink had.  The total number of calories in the meal. Keep your food log near you, such as in a small notebook in your pocket, or use a mobile app or website. Some programs will   calculate calories for you and show you how many calories you have left for the day to meet your goal. What are some calorie counting tips?  Use your calories on foods and drinks that will fill you up and not leave you hungry:  Some examples of foods that fill you up are nuts and nut butters, vegetables, lean proteins, and high-fiber foods like whole grains. High-fiber foods are foods with more than 5 g fiber per serving.  Drinks such as sodas, specialty coffee drinks, alcohol, and juices have a lot of calories, yet do not fill you up.  Eat nutritious foods and avoid empty calories. Empty calories are calories you get from foods or beverages that do not have many vitamins or protein, such as candy, sweets, and  soda. It is better to have a nutritious high-calorie food (such as an avocado) than a food with few nutrients (such as a bag of chips).  Know how many calories are in the foods you eat most often. This will help you calculate calorie counts faster.  Pay attention to calories in drinks. Low-calorie drinks include water and unsweetened drinks.  Pay attention to nutrition labels for "low fat" or "fat free" foods. These foods sometimes have the same amount of calories or more calories than the full fat versions. They also often have added sugar, starch, or salt, to make up for flavor that was removed with the fat.  Find a way of tracking calories that works for you. Get creative. Try different apps or programs if writing down calories does not work for you. What are some portion control tips?  Know how many calories are in a serving. This will help you know how many servings of a certain food you can have.  Use a measuring cup to measure serving sizes. You could also try weighing out portions on a kitchen scale. With time, you will be able to estimate serving sizes for some foods.  Take some time to put servings of different foods on your favorite plates, bowls, and cups so you know what a serving looks like.  Try not to eat straight from a bag or box. Doing this can lead to overeating. Put the amount you would like to eat in a cup or on a plate to make sure you are eating the right portion.  Use smaller plates, glasses, and bowls to prevent overeating.  Try not to multitask (for example, watch TV or use your computer) while eating. If it is time to eat, sit down at a table and enjoy your food. This will help you to know when you are full. It will also help you to be aware of what you are eating and how much you are eating. What are tips for following this plan? Reading food labels   Check the calorie count compared to the serving size. The serving size may be smaller than what you are used to  eating.  Check the source of the calories. Make sure the food you are eating is high in vitamins and protein and low in saturated and trans fats. Shopping   Read nutrition labels while you shop. This will help you make healthy decisions before you decide to purchase your food.  Make a grocery list and stick to it. Cooking   Try to cook your favorite foods in a healthier way. For example, try baking instead of frying.  Use low-fat dairy products. Meal planning   Use more fruits and vegetables. Half of your   plate should be fruits and vegetables.  Include lean proteins like poultry and fish. How do I count calories when eating out?  Ask for smaller portion sizes.  Consider sharing an entree and sides instead of getting your own entree.  If you get your own entree, eat only half. Ask for a box at the beginning of your meal and put the rest of your entree in it so you are not tempted to eat it.  If calories are listed on the menu, choose the lower calorie options.  Choose dishes that include vegetables, fruits, whole grains, low-fat dairy products, and lean protein.  Choose items that are boiled, broiled, grilled, or steamed. Stay away from items that are buttered, battered, fried, or served with cream sauce. Items labeled "crispy" are usually fried, unless stated otherwise.  Choose water, low-fat milk, unsweetened iced tea, or other drinks without added sugar. If you want an alcoholic beverage, choose a lower calorie option such as a glass of wine or light beer.  Ask for dressings, sauces, and syrups on the side. These are usually high in calories, so you should limit the amount you eat.  If you want a salad, choose a garden salad and ask for grilled meats. Avoid extra toppings like bacon, cheese, or fried items. Ask for the dressing on the side, or ask for olive oil and vinegar or lemon to use as dressing.  Estimate how many servings of a food you are given. For example, a serving  of cooked rice is  cup or about the size of half a baseball. Knowing serving sizes will help you be aware of how much food you are eating at restaurants. The list below tells you how big or small some common portion sizes are based on everyday objects:  1 oz-4 stacked dice.  3 oz-1 deck of cards.  1 tsp-1 die.  1 Tbsp- a ping-pong ball.  2 Tbsp-1 ping-pong ball.   cup- baseball.  1 cup-1 baseball. Summary  Calorie counting means keeping track of how many calories you eat and drink each day. If you eat fewer calories than your body needs, you should lose weight.  A healthy amount of weight to lose per week is usually 1-2 lb (0.5-0.9 kg). This usually means reducing your daily calorie intake by 500-750 calories.  The number of calories in a food can be found on a Nutrition Facts label. If a food does not have a Nutrition Facts label, try to look up the calories online or ask your dietitian for help.  Use your calories on foods and drinks that will fill you up, and not on foods and drinks that will leave you hungry.  Use smaller plates, glasses, and bowls to prevent overeating. This information is not intended to replace advice given to you by your health care provider. Make sure you discuss any questions you have with your health care provider. Document Released: 06/12/2005 Document Revised: 05/12/2016 Document Reviewed: 05/12/2016 Elsevier Interactive Patient Education  2017 [employer]. Exercising to [employer] Exercising can help you to lose weight. In order to lose weight through exercise, you need to do vigorous-intensity exercise. You can tell that you are exercising with vigorous intensity if you are breathing very hard and fast and cannot hold a conversation while exercising. Moderate-intensity exercise helps to maintain your current weight. You can tell that you are exercising at a moderate level if you have a higher heart rate and faster breathing, but you are still able  to hold a conversation. How often should I exercise? Choose an activity that you enjoy and set realistic goals. Your health care provider can help you to make an activity plan that works for you. Exercise regularly as directed by your health care provider. This may include:  Doing resistance training twice each week, such as:  Push-ups.  Sit-ups.  Lifting weights.  Using resistance bands.  Doing a given intensity of exercise for a given amount of time. Choose from these options:  150 minutes of moderate-intensity exercise every week.  75 minutes of vigorous-intensity exercise every week.  A mix of moderate-intensity and vigorous-intensity exercise every week. Children, pregnant women, people who are out of shape, people who are overweight, and older adults may need to consult a health care provider for individual recommendations. If you have any sort of medical condition, be sure to consult your health care provider before starting a new exercise program. What are some activities that can help me to lose weight?  Walking at a rate of at least 4.5 miles an hour.  Jogging or running at a rate of 5 miles per hour.  Biking at a rate of at least 10 miles per hour.  Lap swimming.  Roller-skating or in-line skating.  Cross-country skiing.  Vigorous competitive sports, such as football, basketball, and soccer.  Jumping rope.  Aerobic dancing. How can I be more active in my day-to-day activities?  Use the stairs instead of the elevator.  Take a walk during your lunch break.  If you drive, park your car farther away from work or school.  If you take public transportation, get off one stop early and walk the rest of the way.  Make all of your phone calls while standing up and walking around.  Get up, stretch, and walk around every 30 minutes throughout the day. What guidelines should I follow while exercising?  Do not exercise so much that you hurt yourself, feel dizzy,  or get very short of breath.  Consult your health care provider prior to starting a new exercise program.  Wear comfortable clothes and shoes with good support.  Drink plenty of water while you exercise to prevent dehydration or heat stroke. Body water is lost during exercise and must be replaced.  Work out until you breathe faster and your heart beats faster. This information is not intended to replace advice given to you by your health care provider. Make sure you discuss any questions you have with your health care provider. Document Released: 07/15/2010 Document Revised: 11/18/2015 Document Reviewed: 11/13/2013 Elsevier Interactive Patient Education  2017 [place] 18-39 Years, Female Preventive care refers to lifestyle choices and visits with your health care provider that can promote health and wellness. What does preventive care include?  A yearly physical exam. This is also called an annual well check.  Dental exams once or twice a year.  Routine eye exams. Ask your health care provider how often you should have your eyes checked.  Personal lifestyle choices, including:  Daily care of your teeth and gums.  Regular physical activity.  Eating a healthy diet.  Avoiding tobacco and drug use.  Limiting alcohol use.  Practicing safe sex.  Taking vitamin and mineral supplements as recommended by your health care provider. What happens during an annual well check? The services and screenings done by your health care provider during your annual well check will depend on your age, overall health, lifestyle risk factors, and family  history of disease. Counseling  Your health care provider may ask you questions about your:  Alcohol use.  Tobacco use.  Drug use.  Emotional well-being.  Home and relationship well-being.  Sexual activity.  Eating habits.  Work and work Statistician.  Method of birth control.  Menstrual  cycle.  Pregnancy history. Screening  You may have the following tests or measurements:  Height, weight, and BMI.  Diabetes screening. This is done by checking your blood sugar (glucose) after you have not eaten for a while (fasting).  Blood pressure.  Lipid and cholesterol levels. These may be checked every 5 years starting at age 33.  Skin check.  Hepatitis C blood test.  Hepatitis B blood test.  Sexually transmitted disease (STD) testing.  BRCA-related cancer screening. This may be done if you have a family history of breast, ovarian, tubal, or peritoneal cancers.  Pelvic exam and Pap test. This may be done every 3 years starting at age 76. Starting at age 65, this may be done every 5 years if you have a Pap test in combination with an HPV test. Discuss your test results, treatment options, and if necessary, the need for more tests with your health care provider. Vaccines  Your health care provider may recommend certain vaccines, such as:  Influenza vaccine. This is recommended every year.  Tetanus, diphtheria, and acellular pertussis (Tdap, Td) vaccine. You may need a Td booster every 10 years.  Varicella vaccine. You may need this if you have not been vaccinated.  HPV vaccine. If you are 15 or younger, you may need three doses over 6 months.  Measles, mumps, and rubella (MMR) vaccine. You may need at least one dose of MMR. You may also need a second dose.  Pneumococcal 13-valent conjugate (PCV13) vaccine. You may need this if you have certain conditions and were not previously vaccinated.  Pneumococcal polysaccharide (PPSV23) vaccine. You may need one or two doses if you smoke cigarettes or if you have certain conditions.  Meningococcal vaccine. One dose is recommended if you are age 59-21 years and a first-year college student living in a residence hall, or if you have one of several medical conditions. You may also need additional booster doses.  Hepatitis A  vaccine. You may need this if you have certain conditions or if you travel or work in places where you may be exposed to hepatitis A.  Hepatitis B vaccine. You may need this if you have certain conditions or if you travel or work in places where you may be exposed to hepatitis B.  Haemophilus influenzae type b (Hib) vaccine. You may need this if you have certain risk factors. Talk to your health care provider about which screenings and vaccines you need and how often you need them. This information is not intended to replace advice given to you by your health care provider. Make sure you discuss any questions you have with your health care provider. Document Released: 08/08/2001 Document Revised: 03/01/2016 Document Reviewed: 04/13/2015 Elsevier Interactive Patient Education  2017 [employer].

## 2016-09-14 ENCOUNTER — Telehealth: Payer: Self-pay

## 2016-09-14 NOTE — Telephone Encounter (Signed)
Prior Authorization request received from patients pharmacy. I have started this today through [facility] per her insurance. Pt aware and I will keep her updated on status of PA as I am updated. KJ CMA

## 2016-09-15 LAB — PAPIG, HPV, RFX 16/18
HPV, high-risk: NEGATIVE
PAP Smear Comment: 0

## 2016-09-18 NOTE — Telephone Encounter (Signed)
Pt aware prior authorization for Belviq was denied through her insurance. Advised patient to look over her medication formulary and see just what they will pay for. She is aware [facility] is out of the office until next week. KJ CMA

## 2016-09-25 ENCOUNTER — Encounter: Payer: Self-pay | Admitting: Obstetrics and Gynecology

## 2016-10-24 ENCOUNTER — Encounter: Payer: Self-pay | Admitting: Family Medicine

## 2016-10-24 ENCOUNTER — Ambulatory Visit (INDEPENDENT_AMBULATORY_CARE_PROVIDER_SITE_OTHER): Payer: 59 | Admitting: Family Medicine

## 2016-10-24 VITALS — BP 130/100 | HR 64 | Ht 64.0 in | Wt 231.0 lb

## 2016-10-24 DIAGNOSIS — K2950 Unspecified chronic gastritis without bleeding: Secondary | ICD-10-CM | POA: Diagnosis not present

## 2016-10-24 NOTE — Progress Notes (Signed)
Name: Brenda [name]   MRN: [id number]    DOB: 27-Nov-1980   Date:10/24/2016       Progress Note  Subjective  Chief Complaint  Chief Complaint  Patient presents with  . Emesis    started when taking [place]- no longer taking med but still has the vomitting, gassy, soreness in abdomen- eating yogurt helps. Eating more acidic foods- leads to vomitting    Emesis   This is a recurrent problem. The current episode started more than 1 year ago. The problem occurs less than 2 times per day. The problem has been waxing and waning. The emesis has an appearance of bile. Associated symptoms include abdominal pain, chest pain, chills, headaches and weight loss. Pertinent negatives include no arthralgias, coughing, diarrhea, dizziness, fever, myalgias, sweats or URI. Treatments tried: zantac. The treatment provided moderate relief.    No problem-specific Assessment & Plan notes found for this encounter.   Past Medical History:  Diagnosis Date  . ADHD, adult residual type   . Anxiety   . Depression     Past Surgical History:  Procedure Laterality Date  . BREAST REDUCTION SURGERY    . TONSILLECTOMY      Family History  Problem Relation Age of Onset  . Heart disease Maternal Grandmother   . Heart disease Maternal Grandfather   . Diabetes Paternal Grandfather     Social History   Social History  . Marital status: Married    Spouse name: N/A  . Number of children: N/A  . Years of education: N/A   Occupational History  . Not on file.   Social History Main Topics  . Smoking status: Never Smoker  . Smokeless tobacco: Never Used  . Alcohol use 0.0 oz/week  . Drug use: No  . Sexual activity: Yes    Birth control/ protection: Pill   Other Topics Concern  . Not on file   Social History Narrative  . No narrative on file    Allergies  Allergen Reactions  . Ace Inhibitors Swelling    Lips/ face    Outpatient Medications Prior to Visit  Medication Sig Dispense Refill  .  ADDERALL XR 20 MG 24 hr capsule Take 1 capsule by mouth daily. Dr Evelene Croon  0  . escitalopram (LEXAPRO) 20 MG tablet Take 1 tablet (20 mg total) by mouth daily. 30 tablet 2  . hydrochlorothiazide (HYDRODIURIL) 12.5 MG tablet Take 1 tablet (12.5 mg total) by mouth daily. 90 tablet 3  . hydrOXYzine (ATARAX/VISTARIL) 50 MG tablet Take 1 tablet (50 mg total) by mouth every 6 (six) hours as needed for anxiety. 40 tablet 3  . norethindrone (MICRONOR,CAMILA,ERRIN) 0.35 MG tablet Take 1 tablet (0.35 mg total) by mouth daily. 1 Package 11  . ranitidine (ZANTAC) 150 MG tablet Take 1 tablet (150 mg total) by mouth 2 (two) times daily. 60 tablet 11  . amphetamine-dextroamphetamine (ADDERALL) 20 MG tablet Take 1 tablet (20 mg total) by mouth 2 (two) times daily. 60 tablet 0  . Lorcaserin HCl ER (BELVIQ XR) 20 MG TB24 Take 1 tablet by mouth daily. 30 tablet 2   No facility-administered medications prior to visit.     Review of Systems  Constitutional: Positive for chills and weight loss. Negative for fever and malaise/fatigue.  HENT: Negative for ear discharge, ear pain and sore throat.   Eyes: Negative for blurred vision.  Respiratory: Negative for cough, sputum production, shortness of breath and wheezing.   Cardiovascular: Positive for chest pain. Negative for  palpitations and leg swelling.  Gastrointestinal: Positive for abdominal pain and vomiting. Negative for blood in stool, constipation, diarrhea, heartburn, melena and nausea.  Genitourinary: Negative for dysuria, frequency, hematuria and urgency.  Musculoskeletal: Negative for arthralgias, back pain, joint pain, myalgias and neck pain.  Skin: Negative for rash.  Neurological: Positive for headaches. Negative for dizziness, tingling, sensory change and focal weakness.  Endo/Heme/Allergies: Negative for environmental allergies and polydipsia. Does not bruise/bleed easily.  Psychiatric/Behavioral: Positive for depression. Negative for suicidal ideas. The  patient is nervous/anxious. The patient does not have insomnia.      Objective  Vitals:   10/24/16 1358  BP: (!) 130/100  Pulse: 64  Weight: 231 lb (104.8 kg)  Height:  (1.626 m)    Physical Exam  Constitutional: She is well-developed, well-nourished, and in no distress. No distress.  HENT:  Head: Normocephalic and atraumatic.  Right Ear: External ear normal.  Left Ear: External ear normal.  Nose: Nose normal.  Mouth/Throat: Oropharynx is clear and moist.  Eyes: Conjunctivae and EOM are normal. Pupils are equal, round, and reactive to light. Right eye exhibits no discharge. Left eye exhibits no discharge.  Neck: Normal range of motion. Neck supple. No JVD present. No thyromegaly present.  Cardiovascular: Normal rate, regular rhythm, normal heart sounds and intact distal pulses.  Exam reveals no gallop and no friction rub.   No murmur heard. Pulmonary/Chest: Effort normal and breath sounds normal. She has no wheezes. She has no rales.  Abdominal: Soft. Bowel sounds are normal. She exhibits no mass. There is no tenderness. There is no guarding.  Musculoskeletal: Normal range of motion. She exhibits no edema.  Lymphadenopathy:    She has no cervical adenopathy.  Neurological: She is alert. She has normal reflexes.  Skin: Skin is warm and dry. She is not diaphoretic.  Psychiatric: Mood and affect normal.  Nursing note and vitals reviewed.     Assessment & Plan  Problem List Items Addressed This Visit    None    Visit Diagnoses    Chronic gastritis without bleeding, unspecified gastritis type    -  Primary   dexilant/hemoccult x 3   Relevant Orders   H. pylori antibody, IgG      No orders of the defined types were placed in this encounter.     Dr. Hayden [facility]  10/24/16

## 2016-10-25 LAB — H. PYLORI ANTIBODY, IGG: H. pylori, IgG AbS: <0.8 Index Value (ref 0.00–0.79)

## 2016-11-01 ENCOUNTER — Encounter: Payer: Self-pay | Admitting: Family Medicine

## 2016-11-01 ENCOUNTER — Other Ambulatory Visit
Admission: RE | Admit: 2016-11-01 | Discharge: 2016-11-01 | Disposition: A | Discharge status: Home / Self Care | Payer: 59 | Source: Ambulatory Visit | Attending: Family Medicine | Admitting: Family Medicine

## 2016-11-01 ENCOUNTER — Ambulatory Visit: Payer: 59 | Admitting: Family Medicine

## 2016-11-01 ENCOUNTER — Ambulatory Visit (INDEPENDENT_AMBULATORY_CARE_PROVIDER_SITE_OTHER): Payer: 59 | Admitting: Family Medicine

## 2016-11-01 VITALS — BP 122/88 | HR 72

## 2016-11-01 DIAGNOSIS — K5900 Constipation, unspecified: Secondary | ICD-10-CM

## 2016-11-01 DIAGNOSIS — R109 Unspecified abdominal pain: Secondary | ICD-10-CM | POA: Insufficient documentation

## 2016-11-01 DIAGNOSIS — K602 Anal fissure, unspecified: Secondary | ICD-10-CM | POA: Diagnosis not present

## 2016-11-01 DIAGNOSIS — K921 Melena: Secondary | ICD-10-CM | POA: Diagnosis not present

## 2016-11-01 DIAGNOSIS — K922 Gastrointestinal hemorrhage, unspecified: Secondary | ICD-10-CM | POA: Diagnosis not present

## 2016-11-01 LAB — CBC WITH DIFFERENTIAL/PLATELET
BASOS ABS: 0.1 10*3/uL (ref 0–0.1)
Basophils Relative: 1 %
Eosinophils Absolute: 0.5 10*3/uL (ref 0–0.7)
Eosinophils Relative: 5 %
HEMATOCRIT: 44 % (ref 35.0–47.0)
Hemoglobin: 14.8 g/dL (ref 12.0–16.0)
LYMPHS ABS: 2.7 10*3/uL (ref 1.0–3.6)
LYMPHS PCT: 26 %
MCH: 29.3 pg (ref 26.0–34.0)
MCHC: 33.6 g/dL (ref 32.0–36.0)
MCV: 87.4 fL (ref 80.0–100.0)
Monocytes Absolute: 0.7 10*3/uL (ref 0.2–0.9)
Monocytes Relative: 7 %
NEUTROS ABS: 6.3 10*3/uL (ref 1.4–6.5)
NEUTROS PCT: 61 %
PLATELETS: 413 10*3/uL (ref 150–440)
RBC: 5.03 MIL/uL (ref 3.80–5.20)
RDW: 12.8 % (ref 11.5–14.5)
WBC: 10.4 10*3/uL (ref 3.6–11.0)

## 2016-11-01 LAB — HEMOCCULT GUIAC POC 1CARD (OFFICE): Fecal Occult Blood, POC: NEGATIVE

## 2016-11-01 MED ORDER — DOCUSATE SODIUM 50 MG PO CAPS: 50.0000 mg | ORAL_CAPSULE | Freq: Two times a day (BID) | ORAL | 0 refills | Status: DC

## 2016-11-01 MED ORDER — HYDROCORTISONE ACETATE 25 MG RE SUPP: 25.0000 mg | Freq: Two times a day (BID) | RECTAL | 0 refills | Status: DC

## 2016-11-01 NOTE — Patient Instructions (Signed)
Anal Fissure, Adult An anal fissure is a small tear or crack in the skin around the anus. Bleeding from a fissure usually stops on its own within a few minutes. However, bleeding will often occur again with each bowel movement until the crack heals. What are the causes? This condition may be caused by:  Passing large, hard stool (feces).  Frequent diarrhea.  Constipation.  Inflammatory bowel disease (Crohn disease or ulcerative colitis).  Infections.  Anal sex. What are the signs or symptoms? Symptoms of this condition include:  Bleeding from the rectum.  Small amounts of blood seen on your stool, on toilet paper, or in the toilet after a bowel movement.  Painful bowel movements.  Itching or irritation around the anus. How is this diagnosed? A health care provider may diagnose this condition by closely examining the anal area. An anal fissure can usually be seen with careful inspection. In some cases, a rectal exam may be performed, or a short tube (anoscope) may be used to examine the anal canal. How is this treated? Treatment for this condition may include:  Taking steps to avoid constipation. This may include making changes to your diet, such as increasing your intake of fiber or fluid.  Taking fiber supplements. These supplements can soften your stool to help make bowel movements easier. Your health care provider may also prescribe a stool softener if your stool is often hard.  Taking sitz baths. This may help to heal the tear.  Using medicated creams or ointments. These may be prescribed to lessen discomfort. Follow these instructions at home: Eating and drinking   Avoid foods that may be constipating, such as bananas and dairy products.  Drink enough fluid to keep your urine clear or pale yellow.  Maintain a diet that is high in fruits, whole grains, and vegetables. General instructions   Keep the anal area as clean and dry as possible.  Take sitz baths as told  by your health care provider. Do not use soap in the sitz baths.  Take over-the-counter and prescription medicines only as told by your health care provider.  Use creams or ointments only as told by your health care provider.  Keep all follow-up visits as told by your health care provider. This is important. Contact a health care provider if:  You have more bleeding.  You have a fever.  You have diarrhea that is mixed with blood.  You continue to have pain.  Your problem is getting worse rather than better. This information is not intended to replace advice given to you by your health care provider. Make sure you discuss any questions you have with your health care provider. Document Released: 06/12/2005 Document Revised: 10/20/2015 Document Reviewed: 09/07/2014 Elsevier Interactive Patient Education  2017 [employer] How to Take a [employer] Bath A sitz bath is a warm water bath that is taken while you are sitting down. The water should only come up to your hips and should cover your buttocks. Your health care provider may recommend a sitz bath to help you:  Clean the lower part of your body, including your genital area.  With itching.  With pain.  With sore muscles or muscles that tighten or spasm. How to take a sitz bath Take 3-4 sitz baths per day or as told by your health care provider. 1. Partially fill a bathtub with warm water. You will only need the water to be deep enough to cover your hips and buttocks when you are sitting in  it. 2. If your health care provider told you to put medicine in the water, follow the directions exactly. 3. Sit in the water and open the tub drain a little. 4. Turn on the warm water again to keep the tub at the correct level. Keep the water running constantly. 5. Soak in the water for 15-20 minutes or as told by your health care provider. 6. After the sitz bath, pat the affected area dry first. Do not rub it. 7. Be careful when you stand up  after the sitz bath because you may feel dizzy. Contact a health care provider if:  Your symptoms get worse. Do not continue with sitz baths if your symptoms get worse.  You have new symptoms. Do not continue with sitz baths until you talk with your health care provider. This information is not intended to replace advice given to you by your health care provider. Make sure you discuss any questions you have with your health care provider. Document Released: 03/04/2004 Document Revised: 11/10/2015 Document Reviewed: 06/10/2014 Elsevier Interactive Patient Education  2017 [employer].

## 2016-11-01 NOTE — Progress Notes (Signed)
Name: Brenda HalstedVirginia Tate [name]   MRN: [id number]    DOB: 06/18/1981   Date:11/01/2016       Progress Note  Subjective  Chief Complaint  No chief complaint on file.   Abdominal Pain  This is a new problem. The current episode started in the past 7 days. The onset quality is gradual. The problem occurs intermittently. The most recent episode lasted 1 day. The problem has been waxing and waning. The pain is located in the generalized abdominal region. The pain is at a severity of 8/10. The pain is moderate. The quality of the pain is aching. The abdominal pain does not radiate. Associated symptoms include anorexia, constipation, diarrhea, hematochezia and weight loss. Pertinent negatives include no arthralgias, belching, dysuria, fever, flatus, frequency, headaches, hematuria, melena, myalgias, nausea or vomiting. The pain is relieved by bowel movements. The treatment provided moderate relief. Her past medical history is significant for GERD. There is no history of abdominal surgery, colon cancer, Crohn's disease, gallstones, irritable bowel syndrome or ulcerative colitis.  GI Problem  The primary symptoms include weight loss, abdominal pain, diarrhea and hematochezia. Primary symptoms do not include fever, fatigue, nausea, vomiting, melena, hematemesis, jaundice, dysuria, myalgias, arthralgias or rash. The onset was gradual.  The illness is also significant for anorexia and constipation. The illness does not include chills or back pain. Significant associated medical issues include GERD. Associated medical issues do not include gallstones, gastric bypass, irritable bowel syndrome or diverticulitis.    No problem-specific Assessment & Plan notes found for this encounter.   Past Medical History:  Diagnosis Date  . ADHD, adult residual type   . Anxiety   . Depression     Past Surgical History:  Procedure Laterality Date  . BREAST REDUCTION SURGERY    . TONSILLECTOMY      Family History  Problem  Relation Age of Onset  . Heart disease Maternal Grandmother   . Heart disease Maternal Grandfather   . Diabetes Paternal Grandfather     Social History   Social History  . Marital status: Married    Spouse name: N/A  . Number of children: N/A  . Years of education: N/A   Occupational History  . Not on file.   Social History Main Topics  . Smoking status: Never Smoker  . Smokeless tobacco: Never Used  . Alcohol use 0.0 oz/week  . Drug use: No  . Sexual activity: Yes    Birth control/ protection: Pill   Other Topics Concern  . Not on file   Social History Narrative  . No narrative on file    Allergies  Allergen Reactions  . Ace Inhibitors Swelling    Lips/ face    Outpatient Medications Prior to Visit  Medication Sig Dispense Refill  . ADDERALL XR 20 MG 24 hr capsule Take 1 capsule by mouth daily. Dr Evelene CroonKaur  0  . escitalopram (LEXAPRO) 20 MG tablet Take 1 tablet (20 mg total) by mouth daily. 30 tablet 2  . hydrochlorothiazide (HYDRODIURIL) 12.5 MG tablet Take 1 tablet (12.5 mg total) by mouth daily. 90 tablet 3  . hydrOXYzine (ATARAX/VISTARIL) 50 MG tablet Take 1 tablet (50 mg total) by mouth every 6 (six) hours as needed for anxiety. 40 tablet 3  . norethindrone (MICRONOR,CAMILA,ERRIN) 0.35 MG tablet Take 1 tablet (0.35 mg total) by mouth daily. 1 Package 11  . ranitidine (ZANTAC) 150 MG tablet Take 1 tablet (150 mg total) by mouth 2 (two) times daily. 60 tablet 11  No facility-administered medications prior to visit.     Review of Systems  Constitutional: Positive for weight loss. Negative for chills, fatigue, fever and malaise/fatigue.  HENT: Negative for ear discharge, ear pain and sore throat.   Eyes: Negative for blurred vision.  Respiratory: Negative for cough, sputum production, shortness of breath and wheezing.   Cardiovascular: Negative for chest pain, palpitations and leg swelling.  Gastrointestinal: Positive for abdominal pain, anorexia, constipation,  diarrhea and hematochezia. Negative for blood in stool, flatus, heartburn, hematemesis, jaundice, melena, nausea and vomiting.  Genitourinary: Negative for dysuria, frequency, hematuria and urgency.  Musculoskeletal: Negative for arthralgias, back pain, joint pain, myalgias and neck pain.  Skin: Negative for rash.  Neurological: Negative for dizziness, tingling, sensory change, focal weakness and headaches.  Endo/Heme/Allergies: Negative for environmental allergies and polydipsia. Does not bruise/bleed easily.  Psychiatric/Behavioral: Negative for depression and suicidal ideas. The patient is not nervous/anxious and does not have insomnia.      Objective  Vitals:   11/01/16 0905 11/01/16 0906 11/01/16 0907  BP: (!) 130/100 (!) 120/100 122/88  Pulse: (!) 104 88 72    Physical Exam  Constitutional: She is well-developed, well-nourished, and in no distress. No distress.  HENT:  Head: Normocephalic and atraumatic.  Right Ear: External ear normal.  Left Ear: External ear normal.  Nose: Nose normal.  Mouth/Throat: Oropharynx is clear and moist.  Eyes: Conjunctivae and EOM are normal. Pupils are equal, round, and reactive to light. Right eye exhibits no discharge. Left eye exhibits no discharge.  Neck: Normal range of motion. Neck supple. No JVD present. No thyromegaly present.  Cardiovascular: Normal rate, regular rhythm, normal heart sounds and intact distal pulses.  Exam reveals no gallop and no friction rub.   No murmur heard. Pulmonary/Chest: Effort normal and breath sounds normal. She has no wheezes. She has no rales.  Abdominal: Soft. Bowel sounds are normal. She exhibits no mass. There is no tenderness. There is no guarding.  Genitourinary: Rectal exam shows external hemorrhoid and guaiac positive stool. Rectal exam shows no fissure, no mass and no tenderness.  Musculoskeletal: Normal range of motion. She exhibits no edema.  Lymphadenopathy:    She has no cervical adenopathy.   Neurological: She is alert. She has normal reflexes.  Skin: Skin is warm and dry. She is not diaphoretic.  Psychiatric: Mood and affect normal.  Nursing note and vitals reviewed.     Assessment & Plan  Problem List Items Addressed This Visit    None    Visit Diagnoses    Fissure in ano    -  Primary   Relevant Medications   hydrocortisone (ANUSOL-HC) 25 MG suppository   Hematochezia       Relevant Orders   POCT occult blood stool (Completed)   Ambulatory referral to Gastroenterology   Constipation, unspecified constipation type       Relevant Medications   docusate sodium (COLACE) 50 MG capsule   Other Relevant Orders   Ambulatory referral to Gastroenterology      Meds ordered this encounter  Medications  . docusate sodium (COLACE) 50 MG capsule    Sig: Take 1 capsule (50 mg total) by mouth 2 (two) times daily.    Dispense:  60 capsule    Refill:  0  . hydrocortisone (ANUSOL-HC) 25 MG suppository    Sig: Place 1 suppository (25 mg total) rectally 2 (two) times daily.    Dispense:  12 suppository    Refill:  0   Pt  called after going home and said she had a "grumbling in my stomach and then passed a huge clot in toilet" Send to GI for further eval.   Dr. [facility]  11/01/16

## 2016-11-02 ENCOUNTER — Telehealth: Payer: Self-pay | Admitting: Gastroenterology

## 2016-11-02 ENCOUNTER — Ambulatory Visit (INDEPENDENT_AMBULATORY_CARE_PROVIDER_SITE_OTHER): Payer: 59 | Admitting: Gastroenterology

## 2016-11-02 ENCOUNTER — Other Ambulatory Visit: Payer: Self-pay

## 2016-11-02 VITALS — BP 131/93 | HR 97 | Temp 98.4°F | Ht 64.0 in | Wt 231.5 lb

## 2016-11-02 DIAGNOSIS — K921 Melena: Secondary | ICD-10-CM

## 2016-11-02 DIAGNOSIS — K219 Gastro-esophageal reflux disease without esophagitis: Secondary | ICD-10-CM

## 2016-11-02 NOTE — Telephone Encounter (Signed)
11/02/16 [employer] website Authorization for Colonoscopy [id number] / [id number] [id number] for K92.1 & K21.9 has been approved Auth #: [id number].

## 2016-11-02 NOTE — Progress Notes (Signed)
Gastroenterology Consultation  Referring Provider:     Duanne LimerickJones, Deanna C, MD Primary Care Physician:  Duanne LimerickJones, Deanna C, MD Primary Gastroenterologist:  Dr. Servando SnareWohl     Reason for Consultation:     Abdominal pain with rectal bleeding         HPI:   Brenda [name] is a 36 y.o. y/o female referred for consultation & management of Abdominal pain with rectal bleeding by Dr. Duanne LimerickJones, Deanna C, MD.  This patient comes today after being sent by her primary care provider who had contacted me yesterday due to the patient having weight loss abdominal pain and diarrhea with hematochezia. The patient states that she has been passing clots. There is no report of any fevers chills nausea vomiting associated with the abdominal pain and hematochezia. The patient reports the pain to be an 8 out of 10 and crampy in nature. At been constipated before that and then started to have bloody stools with diarrhea. There is no report of any previous symptoms like this. The patient has reported that the attacks of abdominal pain have lasted as long as one day. The patient was thought to have anal fissure and treated with Anusol with hydrocortisone suppositories. The patient then called back to her primary care provider and states that she had passed a large amount of blood therefore she was sent to me for evaluation. The patient reports that a lot of her heartburn symptoms started when she was taking a psychiatric medication. The patient was previously treated with Zantac and Tums and states that her heartburn did not go away. The patient has been taking Dexilant and states that her nausea and vomiting and heartburn have completely resolved. The patient states her bowel movements have come back to normal this morning without any further bleeding. The patient does have a history of anorexia and bulimia.  Past Medical History:  Diagnosis Date  . ADHD, adult residual type   . Anxiety   . Depression     Past Surgical  History:  Procedure Laterality Date  . BREAST REDUCTION SURGERY    . TONSILLECTOMY      Prior to Admission medications   Medication Sig Start Date End Date Taking? Authorizing Provider  ADDERALL XR 20 MG 24 hr capsule Take 1 capsule by mouth daily. Dr Evelene CroonKaur 11/23/15   [provider]  docusate sodium (COLACE) 50 MG capsule Take 1 capsule (50 mg total) by mouth 2 (two) times daily. 11/01/16   Duanne LimerickJones, Deanna C, MD  escitalopram (LEXAPRO) 20 MG tablet Take 1 tablet (20 mg total) by mouth daily. 09/12/16 09/12/17  Vena AustriaStaebler, Andreas, MD  hydrochlorothiazide (HYDRODIURIL) 12.5 MG tablet Take 1 tablet (12.5 mg total) by mouth daily. 12/17/15   Duanne LimerickJones, Deanna C, MD  hydrocortisone (ANUSOL-HC) 25 MG suppository Place 1 suppository (25 mg total) rectally 2 (two) times daily. 11/01/16   Duanne LimerickJones, Deanna C, MD  hydrOXYzine (ATARAX/VISTARIL) 50 MG tablet Take 1 tablet (50 mg total) by mouth every 6 (six) hours as needed for anxiety. 09/12/16   Vena AustriaStaebler, Andreas, MD  norethindrone (MICRONOR,CAMILA,ERRIN) 0.35 MG tablet Take 1 tablet (0.35 mg total) by mouth daily. 09/12/16   Vena AustriaStaebler, Andreas, MD  ranitidine (ZANTAC) 150 MG tablet Take 1 tablet (150 mg total) by mouth 2 (two) times daily. 09/12/16 09/12/17  Vena AustriaStaebler, Andreas, MD    Family History  Problem Relation Age of Onset  . Heart disease Maternal Grandmother   . Heart disease Maternal Grandfather   . Diabetes Paternal  Grandfather      Social History  Substance Use Topics  . Smoking status: Never Smoker  . Smokeless tobacco: Never Used  . Alcohol use 0.0 oz/week    Allergies as of 11/02/2016 - Review Complete 11/01/2016  Allergen Reaction Noted  . Ace inhibitors Swelling 12/17/2015    Review of Systems:    All systems reviewed and negative except where noted in HPI.   Physical Exam:  There were no vitals taken for this visit. No LMP recorded. Patient is not currently having periods (Reason: Oral contraceptives). Psych:  Alert and  cooperative. Normal mood and affect. General:   Alert,  Well-developed, well-nourished, pleasant and cooperative in NAD Head:  Normocephalic and atraumatic. Eyes:  Sclera clear, no icterus.   Conjunctiva pink. Ears:  Normal auditory acuity. Nose:  No deformity, discharge, or lesions. Mouth:  No deformity or lesions,oropharynx pink & moist. Neck:  Supple; no masses or thyromegaly. Lungs:  Respirations even and unlabored.  Clear throughout to auscultation.   No wheezes, crackles, or rhonchi. No acute distress. Heart:  Regular rate and rhythm; no murmurs, clicks, rubs, or gallops. Abdomen:  Normal bowel sounds.  No bruits.  Soft, non-tender and non-distended without masses, hepatosplenomegaly or hernias noted.  No guarding or rebound tenderness.  Negative Carnett sign.   Rectal:  Deferred.  Msk:  Symmetrical without gross deformities.  Good, equal movement & strength bilaterally. Pulses:  Normal pulses noted. Extremities:  No clubbing or edema.  No cyanosis. Neurologic:  Alert and oriented x3;  grossly normal neurologically. Skin:  Intact without significant lesions or rashes.  No jaundice. Lymph Nodes:  No significant cervical adenopathy. Psych:  Alert and cooperative. Normal mood and affect.  Imaging Studies: No results found.  Assessment and Plan:   Brenda [name] is a 36 y.o. y/o female who has a history of chronic heartburn that was not help with Tums or Zantac. The patient has been on Dexilant and reports that her heartburn has resolved. Her heartburn was associate with almost daily vomiting. This has also stopped. The patient's main concern was her rectal bleeding that she had after having a very large and hard stool with resulting diarrhea for 2 days after that. She now reports that her diarrhea and hard stools have resolved as well as her rectal bleeding. The patient has been told that due to her rectal bleeding and heartburn she should be set up for an EGD and colonoscopy. The  patient has been explained the plan and agrees with it.    [name] Minium, MD. Clementeen Graham   Note: This dictation was prepared with Dragon dictation along with smaller phrase technology. Any transcriptional errors that result from this process are unintentional.

## 2016-11-06 ENCOUNTER — Encounter: Payer: Self-pay | Admitting: *Deleted

## 2016-11-06 ENCOUNTER — Other Ambulatory Visit: Payer: Self-pay

## 2016-11-06 MED ORDER — DEXLANSOPRAZOLE 60 MG PO CPDR: 60.0000 mg | DELAYED_RELEASE_CAPSULE | Freq: Every day | ORAL | 5 refills | Status: DC

## 2016-11-07 NOTE — Discharge Instructions (Signed)

## 2016-11-09 ENCOUNTER — Encounter
Admission: RE | Disposition: A | Discharge status: Home / Self Care | Payer: Self-pay | Source: Ambulatory Visit | Attending: Gastroenterology

## 2016-11-09 ENCOUNTER — Ambulatory Visit: Payer: 59 | Admitting: Anesthesiology

## 2016-11-09 ENCOUNTER — Ambulatory Visit
Admission: RE | Admit: 2016-11-09 | Discharge: 2016-11-09 | Disposition: A | Discharge status: Home / Self Care | Payer: 59 | Source: Ambulatory Visit | Attending: Gastroenterology | Admitting: Gastroenterology

## 2016-11-09 DIAGNOSIS — I10 Essential (primary) hypertension: Secondary | ICD-10-CM | POA: Insufficient documentation

## 2016-11-09 DIAGNOSIS — K921 Melena: Secondary | ICD-10-CM

## 2016-11-09 DIAGNOSIS — K219 Gastro-esophageal reflux disease without esophagitis: Secondary | ICD-10-CM | POA: Insufficient documentation

## 2016-11-09 DIAGNOSIS — K641 Second degree hemorrhoids: Secondary | ICD-10-CM | POA: Diagnosis not present

## 2016-11-09 DIAGNOSIS — F419 Anxiety disorder, unspecified: Secondary | ICD-10-CM | POA: Insufficient documentation

## 2016-11-09 DIAGNOSIS — R110 Nausea: Secondary | ICD-10-CM

## 2016-11-09 DIAGNOSIS — R12 Heartburn: Secondary | ICD-10-CM | POA: Diagnosis not present

## 2016-11-09 DIAGNOSIS — F329 Major depressive disorder, single episode, unspecified: Secondary | ICD-10-CM | POA: Insufficient documentation

## 2016-11-09 DIAGNOSIS — K621 Rectal polyp: Secondary | ICD-10-CM | POA: Diagnosis not present

## 2016-11-09 DIAGNOSIS — F909 Attention-deficit hyperactivity disorder, unspecified type: Secondary | ICD-10-CM | POA: Diagnosis not present

## 2016-11-09 DIAGNOSIS — Z79899 Other long term (current) drug therapy: Secondary | ICD-10-CM | POA: Insufficient documentation

## 2016-11-09 HISTORY — DX: Presence of spectacles and contact lenses: Z97.3

## 2016-11-09 HISTORY — PX: COLONOSCOPY WITH PROPOFOL: SHX5780

## 2016-11-09 HISTORY — DX: Gastro-esophageal reflux disease without esophagitis: K21.9

## 2016-11-09 HISTORY — PX: ESOPHAGOGASTRODUODENOSCOPY (EGD) WITH PROPOFOL: SHX5813

## 2016-11-09 HISTORY — DX: Essential (primary) hypertension: I10

## 2016-11-09 SURGERY — COLONOSCOPY WITH PROPOFOL
Anesthesia: Monitor Anesthesia Care | Wound class: Contaminated

## 2016-11-09 MED ORDER — PROPOFOL 10 MG/ML IV BOLUS
INTRAVENOUS | Status: DC | PRN
  Administered 2016-11-09 (×6): 50 mg via INTRAVENOUS
  Administered 2016-11-09: 100 mg via INTRAVENOUS

## 2016-11-09 MED ORDER — GLYCOPYRROLATE 0.2 MG/ML IJ SOLN
INTRAMUSCULAR | Status: DC | PRN
  Administered 2016-11-09: 0.2 mg via INTRAVENOUS

## 2016-11-09 MED ORDER — LIDOCAINE HCL (CARDIAC) 20 MG/ML IV SOLN
INTRAVENOUS | Status: DC | PRN
  Administered 2016-11-09: 50 mg via INTRAVENOUS

## 2016-11-09 MED ORDER — LACTATED RINGERS IV SOLN
INTRAVENOUS | Status: DC
  Administered 2016-11-09: 10:00:00 via INTRAVENOUS

## 2016-11-09 MED ORDER — STERILE WATER FOR IRRIGATION IR SOLN
Status: DC | PRN
  Administered 2016-11-09: 11:00:00

## 2016-11-09 SURGICAL SUPPLY — 35 items
BALLN DILATOR 10-12 8 (BALLOONS)
BALLN DILATOR 12-15 8 (BALLOONS)
BALLN DILATOR 15-18 8 (BALLOONS)
BALLN DILATOR CRE 0-12 8 (BALLOONS)
BALLN DILATOR ESOPH 8 10 CRE (MISCELLANEOUS) IMPLANT
BALLOON DILATOR 12-15 8 (BALLOONS) IMPLANT
BALLOON DILATOR 15-18 8 (BALLOONS) IMPLANT
BALLOON DILATOR CRE 0-12 8 (BALLOONS) IMPLANT
BLOCK BITE 60FR ADLT L/F GRN (MISCELLANEOUS) ×2 IMPLANT
CANISTER SUCT 1200ML W/VALVE (MISCELLANEOUS) ×2 IMPLANT
CLIP HMST 235XBRD CATH ROT (MISCELLANEOUS) IMPLANT
CLIP RESOLUTION 360 11X235 (MISCELLANEOUS)
FCP ESCP3.2XJMB 240X2.8X (MISCELLANEOUS) ×1
FORCEPS BIOP RAD 4 LRG CAP 4 (CUTTING FORCEPS) ×2 IMPLANT
FORCEPS BIOP RJ4 240 W/NDL (MISCELLANEOUS) ×1
FORCEPS ESCP3.2XJMB 240X2.8X (MISCELLANEOUS) ×1 IMPLANT
GOWN CVR UNV OPN BCK APRN NK (MISCELLANEOUS) ×2 IMPLANT
GOWN ISOL THUMB LOOP REG UNIV (MISCELLANEOUS) ×2
INJECTOR VARIJECT VIN23 (MISCELLANEOUS) IMPLANT
KIT DEFENDO VALVE AND CONN (KITS) IMPLANT
KIT ENDO PROCEDURE OLY (KITS) ×2 IMPLANT
MARKER SPOT ENDO TATTOO 5ML (MISCELLANEOUS) IMPLANT
PAD GROUND ADULT SPLIT (MISCELLANEOUS) IMPLANT
PROBE APC STR FIRE (PROBE) IMPLANT
RETRIEVER NET PLAT FOOD (MISCELLANEOUS) IMPLANT
RETRIEVER NET ROTH 2.5X230 LF (MISCELLANEOUS) IMPLANT
SNARE SHORT THROW 13M SML OVAL (MISCELLANEOUS) IMPLANT
SNARE SHORT THROW 30M LRG OVAL (MISCELLANEOUS) IMPLANT
SNARE SNG USE RND 15MM (INSTRUMENTS) IMPLANT
SPOT EX ENDOSCOPIC TATTOO (MISCELLANEOUS)
SYR INFLATION 60ML (SYRINGE) IMPLANT
TRAP ETRAP POLY (MISCELLANEOUS) IMPLANT
VARIJECT INJECTOR VIN23 (MISCELLANEOUS)
WATER STERILE IRR 250ML POUR (IV SOLUTION) ×2 IMPLANT
WIRE CRE 18-20MM 8CM F G (MISCELLANEOUS) IMPLANT

## 2016-11-09 NOTE — Op Note (Signed)
[facility] Gastroenterology Patient Name: Brenda KhanVirginia Tate [name] Procedure Date: 11/09/2016 11:09 AM MRN: [id number] Account #: [id number] Date of Birth: 04/22/1981 Admit Type: Outpatient Age: [age] [facility] Gender: Female Note Status: Finalized Procedure:            Colonoscopy Indications:          Hematochezia Providers:            [name] Miniumarren Kaybree Williams MD, MD Referring MD:         Duanne Limerickeanna C. Jones, MD (Referring MD) Medicines:            Propofol per Anesthesia Complications:        No immediate complications. Procedure:            Pre-Anesthesia Assessment:                       - Prior to the procedure, a History and Physical was                        performed, and patient medications and allergies were                        reviewed. The patient's tolerance of previous                        anesthesia was also reviewed. The risks and benefits of                        the procedure and the sedation options and risks were                        discussed with the patient. All questions were                        answered, and informed consent was obtained. Prior                        Anticoagulants: The patient has taken no previous                        anticoagulant or antiplatelet agents. ASA Grade                        Assessment: II - A patient with mild systemic disease.                        After reviewing the risks and benefits, the patient was                        deemed in satisfactory condition to undergo the                        procedure.                       After obtaining informed consent, the colonoscope was                        passed under direct vision. Throughout the procedure,  the patient's blood pressure, pulse, and oxygen                        saturations were monitored continuously. The Olympus                        190 Colonoscope [id number]) was introduced through the   anus and advanced to the the cecum, identified by                        appendiceal orifice and ileocecal valve. The                        colonoscopy was performed without difficulty. The                        patient tolerated the procedure well. The quality of                        the bowel preparation was good. Findings:      The perianal and digital rectal examinations were normal.      A 2 mm polyp was found in the rectum. The polyp was sessile. The polyp       was removed with a cold biopsy forceps. Resection and retrieval were       complete.      Non-bleeding internal hemorrhoids were found during retroflexion. The       hemorrhoids were Grade II (internal hemorrhoids that prolapse but reduce       spontaneously). Impression:           - One 2 mm polyp in the rectum, removed with a cold                        biopsy forceps. Resected and retrieved.                       - Non-bleeding internal hemorrhoids. Recommendation:       - Discharge patient to home.                       - Resume previous diet.                       - Continue present medications.                       - Await pathology results.                       - Repeat colonoscopy in 5 years if polyp adenoma and 10                        years if hyperplastic Procedure Code(s):    --- Professional ---                       [id number], Colonoscopy, flexible; with biopsy, single or                        multiple Diagnosis Code(s):    --- Professional ---  K92.1, Melena (includes Hematochezia)                       K62.1, Rectal polyp CPT copyright 2016 [employer]. All rights reserved. The codes documented in this report are preliminary and upon coder review may  be revised to meet current compliance requirements. [name] Minium MD, MD 11/09/2016 11:42:06 AM This report has been signed electronically. Number of Addenda: 0 Note Initiated On: 11/09/2016 11:09 AM Scope Withdrawal  Time: 0 hours 5 minutes 38 seconds  Total Procedure Duration: 0 hours 10 minutes 12 seconds       [facility]

## 2016-11-09 NOTE — Op Note (Signed)
[facility] Gastroenterology Patient Name: Brenda KhanVirginia Tate Dimick Procedure Date: 11/09/2016 11:09 AM MRN: [id number] Account #: [id number] Date of Birth: 02/12/1981 Admit Type: Outpatient Age: [age] [facility] Gender: Female Note Status: Finalized Procedure:            Upper GI endoscopy Indications:          Heartburn, Nausea Providers:            [name] Miniumarren Harrel Ferrone MD, MD Referring MD:         Duanne Limerickeanna C. Jones, MD (Referring MD) Medicines:            Propofol per Anesthesia Complications:        No immediate complications. Procedure:            Pre-Anesthesia Assessment:                       - Prior to the procedure, a History and Physical was                        performed, and patient medications and allergies were                        reviewed. The patient's tolerance of previous                        anesthesia was also reviewed. The risks and benefits of                        the procedure and the sedation options and risks were                        discussed with the patient. All questions were                        answered, and informed consent was obtained. Prior                        Anticoagulants: The patient has taken no previous                        anticoagulant or antiplatelet agents. ASA Grade                        Assessment: II - A patient with mild systemic disease.                        After reviewing the risks and benefits, the patient was                        deemed in satisfactory condition to undergo the                        procedure.                       After obtaining informed consent, the endoscope was                        passed under direct vision. Throughout the procedure,  the patient's blood pressure, pulse, and oxygen                        saturations were monitored continuously. The Olympus                        190 Endoscope [phone]) was introduced through the         mouth, and advanced to the second part of duodenum. The                        upper GI endoscopy was accomplished without difficulty.                        The patient tolerated the procedure well. Findings:      The Z-line was irregular.      The stomach was normal.      The examined duodenum was normal. Impression:           - Z-line irregular.                       - Normal stomach.                       - Normal examined duodenum.                       - No specimens collected. Recommendation:       - Discharge patient to home.                       - Resume previous diet.                       - Continue present medications. Procedure Code(s):    --- Professional ---                       [phone], Esophagogastroduodenoscopy, flexible, transoral;                        diagnostic, including collection of specimen(s) by                        brushing or washing, when performed (separate procedure) Diagnosis Code(s):    --- Professional ---                       R12, Heartburn                       R11.0, Nausea                       K22.8, Other specified diseases of esophagus CPT copyright 2016 American Medical Association. All rights reserved. The codes documented in this report are preliminary and upon coder review may  be revised to meet current compliance requirements. [name] Minium MD, MD 11/09/2016 11:27:19 AM This report has been signed electronically. Number of Addenda: 0 Note Initiated On: 11/09/2016 11:09 AM      [facility]

## 2016-11-09 NOTE — Transfer of Care (Signed)
Immediate Anesthesia Transfer of Care Note  Patient: Brenda [name]  Procedure(s) Performed: Procedure(s): COLONOSCOPY WITH PROPOFOL (N/A) ESOPHAGOGASTRODUODENOSCOPY (EGD) WITH PROPOFOL (N/A)  Patient Location: PACU  Anesthesia Type: MAC  Level of Consciousness: awake, alert  and patient cooperative  Airway and Oxygen Therapy: Patient Spontanous Breathing and Patient connected to supplemental oxygen  Post-op Assessment: Post-op Vital signs reviewed, Patient's Cardiovascular Status Stable, Respiratory Function Stable, Patent Airway and No signs of Nausea or vomiting  Post-op Vital Signs: Reviewed and stable  Complications: No apparent anesthesia complications

## 2016-11-09 NOTE — Anesthesia Preprocedure Evaluation (Signed)
Anesthesia Evaluation  Patient identified by MRN, date of birth, ID band Patient awake    Reviewed: Allergy & Precautions, H&P , NPO status , Patient's Chart, lab work & pertinent test results  Airway Mallampati: II  TM Distance: >3 FB Neck ROM: full    Dental no notable dental hx.    Pulmonary neg pulmonary ROS,    Pulmonary exam normal        Cardiovascular hypertension, Normal cardiovascular exam     Neuro/Psych    GI/Hepatic Neg liver ROS, Medicated,  Endo/Other  negative endocrine ROS  Renal/GU negative Renal ROS     Musculoskeletal   Abdominal   Peds  Hematology negative hematology ROS (+)   Anesthesia Other Findings   Reproductive/Obstetrics negative OB ROS                             Anesthesia Physical Anesthesia Plan  ASA: II  Anesthesia Plan: MAC   Post-op Pain Management:    Induction:   Airway Management Planned:   Additional Equipment:   Intra-op Plan:   Post-operative Plan:   Informed Consent: I have reviewed the patients History and Physical, chart, labs and discussed the procedure including the risks, benefits and alternatives for the proposed anesthesia with the patient or authorized representative who has indicated his/her understanding and acceptance.     Plan Discussed with:   Anesthesia Plan Comments:         Anesthesia Quick Evaluation

## 2016-11-09 NOTE — Anesthesia Postprocedure Evaluation (Signed)
Anesthesia Post Note  Patient: Brenda [name]  Procedure(s) Performed: Procedure(s) (LRB): COLONOSCOPY WITH PROPOFOL (N/A) ESOPHAGOGASTRODUODENOSCOPY (EGD) WITH PROPOFOL (N/A)  Patient location during evaluation: PACU Anesthesia Type: MAC Level of consciousness: awake and alert Pain management: pain level controlled Vital Signs Assessment: post-procedure vital signs reviewed and stable Respiratory status: spontaneous breathing Cardiovascular status: blood pressure returned to baseline Postop Assessment: no headache Anesthetic complications: no    Jaci Standard, III,  Rondarius Kadrmas D

## 2016-11-09 NOTE — Anesthesia Procedure Notes (Signed)
Performed by: Jivan Symanski Pre-anesthesia Checklist: Patient identified, Emergency Drugs available, Suction available, Timeout performed and Patient being monitored Patient Re-evaluated:Patient Re-evaluated prior to inductionOxygen Delivery Method: Circle system utilized Preoxygenation: Pre-oxygenation with 100% oxygen Intubation Type: Inhalational induction Ventilation: Mask ventilation without difficulty and Mask ventilation throughout procedure Dental Injury: Teeth and Oropharynx as per pre-operative assessment        

## 2016-11-09 NOTE — H&P (Signed)
[name] Miniumarren Suprena Travaglini, MD [facility] [address] [place], [place] [address] Phone:[phone] Fax : [phone]  Primary Care Physician:  Duanne LimerickJones, Deanna C, MD Primary Gastroenterologist:  Dr. Servando SnareWohl  Pre-Procedure History & Physical: HPI:  Rae HalstedVirginia Tate Dimick is a 36 y.o. female is here for an endoscopy and colonoscopy.   Past Medical History:  Diagnosis Date  . ADHD, adult residual type   . Anxiety   . Depression   . GERD (gastroesophageal reflux disease)   . Hypertension   . Wears contact lenses     Past Surgical History:  Procedure Laterality Date  . BREAST REDUCTION SURGERY    . TONSILLECTOMY      Prior to Admission medications   Medication Sig Start Date End Date Taking? Authorizing Provider  ADDERALL XR 20 MG 24 hr capsule Take 1 capsule by mouth daily. Dr Evelene CroonKaur 11/23/15  Yes [provider]  ALPRAZolam Prudy Feeler(XANAX) 1 MG tablet Take 1 mg by mouth at bedtime as needed for anxiety.   Yes [provider]  dexlansoprazole (DEXILANT) 60 MG capsule Take 1 capsule (60 mg total) by mouth daily. 11/06/16  Yes Duanne LimerickJones, Deanna C, MD  hydrochlorothiazide (HYDRODIURIL) 12.5 MG tablet Take 1 tablet (12.5 mg total) by mouth daily. 12/17/15  Yes Duanne LimerickJones, Deanna C, MD  hydrOXYzine (ATARAX/VISTARIL) 50 MG tablet Take 1 tablet (50 mg total) by mouth every 6 (six) hours as needed for anxiety. 09/12/16  Yes Vena AustriaStaebler, Andreas, MD  norethindrone (MICRONOR,CAMILA,ERRIN) 0.35 MG tablet Take 1 tablet (0.35 mg total) by mouth daily. 09/12/16  Yes Vena AustriaStaebler, Andreas, MD  ranitidine (ZANTAC) 150 MG tablet Take 1 tablet (150 mg total) by mouth 2 (two) times daily. 09/12/16 09/12/17 Yes Vena AustriaStaebler, Andreas, MD  docusate sodium (COLACE) 50 MG capsule Take 1 capsule (50 mg total) by mouth 2 (two) times daily. Patient not taking: Reported on 11/02/2016 11/01/16   Duanne LimerickJones, Deanna C, MD  hydrocortisone (ANUSOL-HC) 25 MG suppository Place 1 suppository (25 mg total) rectally 2 (two) times daily. Patient not taking:  Reported on 11/02/2016 11/01/16   Duanne LimerickJones, Deanna C, MD    Allergies as of 11/02/2016 - Review Complete 11/02/2016  Allergen Reaction Noted  . Ace inhibitors Swelling 12/17/2015    Family History  Problem Relation Age of Onset  . Heart disease Maternal Grandmother   . Heart disease Maternal Grandfather   . Diabetes Paternal Grandfather     Social History   Social History  . Marital status: Married    Spouse name: N/A  . Number of children: N/A  . Years of education: N/A   Occupational History  . Not on file.   Social History Main Topics  . Smoking status: Never Smoker  . Smokeless tobacco: Never Used  . Alcohol use 6.0 oz/week    5 Glasses of wine, 5 Cans of beer per week  . Drug use: No  . Sexual activity: Yes    Birth control/ protection: Pill   Other Topics Concern  . Not on file   Social History Narrative  . No narrative on file    Review of Systems: See HPI, otherwise negative ROS  Physical Exam: BP (!) 122/92   Pulse 85   Temp 97.7 F (36.5 C) (Tympanic)   Resp 16   Ht 5\' 4"  (1.626 m)   Wt 231 lb (104.8 kg)   SpO2 97%   BMI 39.65 kg/m  General:   Alert,  pleasant and cooperative in NAD Head:  Normocephalic and atraumatic. Neck:  Supple; no masses or thyromegaly. Lungs:  Clear throughout to auscultation.    Heart:  Regular rate and rhythm. Abdomen:  Soft, nontender and nondistended. Normal bowel sounds, without guarding, and without rebound.   Neurologic:  Alert and  oriented x4;  grossly normal neurologically.  Impression/Plan: Congo Dimick is here for an endoscopy and colonoscopy to be performed for GERD and rectal bleeding  Risks, benefits, limitations, and alternatives regarding  endoscopy and colonoscopy have been reviewed with the patient.  Questions have been answered.  All parties agreeable.   [name] Minium, MD  11/09/2016, 10:35 AM

## 2016-11-10 ENCOUNTER — Encounter: Payer: Self-pay | Admitting: Gastroenterology

## 2016-11-14 ENCOUNTER — Encounter: Payer: Self-pay | Admitting: Gastroenterology

## 2016-12-12 ENCOUNTER — Ambulatory Visit: Payer: 59 | Admitting: Obstetrics and Gynecology

## 2016-12-25 ENCOUNTER — Ambulatory Visit: Payer: 59 | Admitting: Obstetrics and Gynecology

## 2017-01-05 ENCOUNTER — Other Ambulatory Visit: Payer: Self-pay | Admitting: Family Medicine

## 2017-01-08 ENCOUNTER — Other Ambulatory Visit: Payer: Self-pay

## 2017-02-05 ENCOUNTER — Encounter: Payer: Self-pay | Admitting: Family Medicine

## 2017-02-05 ENCOUNTER — Ambulatory Visit (INDEPENDENT_AMBULATORY_CARE_PROVIDER_SITE_OTHER): Payer: 59 | Admitting: Family Medicine

## 2017-02-05 VITALS — BP 124/80 | HR 88 | Ht 64.0 in | Wt 229.0 lb

## 2017-02-05 DIAGNOSIS — J0100 Acute maxillary sinusitis, unspecified: Secondary | ICD-10-CM | POA: Diagnosis not present

## 2017-02-05 MED ORDER — GUAIFENESIN-CODEINE 100-10 MG/5ML PO SYRP: 5.0000 mL | ORAL_SOLUTION | Freq: Three times a day (TID) | ORAL | 0 refills | Status: DC | PRN

## 2017-02-05 MED ORDER — AZITHROMYCIN 250 MG PO TABS: ORAL_TABLET | ORAL | 1 refills | Status: DC

## 2017-02-05 NOTE — Progress Notes (Signed)
Name: Brenda HalstedVirginia Tate [name]   MRN: [id number]    DOB: 09/13/1980   Date:02/05/2017       Progress Note  Subjective  Chief Complaint  Chief Complaint  Patient presents with  . URI    cough and cong- no production/ feels like it is "stuck in chest"    URI   This is a new problem. The current episode started in the past 7 days. The problem has been gradually worsening. There has been no fever. Associated symptoms include congestion, coughing, headaches, rhinorrhea, sinus pain, sneezing, a sore throat and swollen glands. Pertinent negatives include no abdominal pain, chest pain, diarrhea, dysuria, ear pain, joint pain, joint swelling, nausea, neck pain, rash, vomiting or wheezing. She has tried antihistamine for the symptoms. The treatment provided mild relief.    No problem-specific Assessment & Plan notes found for this encounter.   Past Medical History:  Diagnosis Date  . ADHD, adult residual type   . Anxiety   . Depression   . GERD (gastroesophageal reflux disease)   . Hypertension   . Wears contact lenses     Past Surgical History:  Procedure Laterality Date  . BREAST REDUCTION SURGERY    . COLONOSCOPY WITH PROPOFOL N/A 11/09/2016   Procedure: COLONOSCOPY WITH PROPOFOL;  Surgeon: [name] MiniumWohl, Darren, MD;  [facility];  Service: Endoscopy;  Laterality: N/A;  . ESOPHAGOGASTRODUODENOSCOPY (EGD) WITH PROPOFOL N/A 11/09/2016   Procedure: ESOPHAGOGASTRODUODENOSCOPY (EGD) WITH PROPOFOL;  Surgeon: [name] MiniumWohl, Darren, MD;  [facility];  Service: Endoscopy;  Laterality: N/A;  . TONSILLECTOMY      Family History  Problem Relation Age of Onset  . Heart disease Maternal Grandmother   . Heart disease Maternal Grandfather   . Diabetes Paternal Grandfather     Social History   Social History  . Marital status: Married    Spouse name: N/A  . Number of children: N/A  . Years of education: N/A   Occupational History  . Not on file.   Social History Main Topics   . Smoking status: Never Smoker  . Smokeless tobacco: Never Used  . Alcohol use 6.0 oz/week    5 Glasses of wine, 5 Cans of beer per week  . Drug use: No  . Sexual activity: Yes    Birth control/ protection: Pill   Other Topics Concern  . Not on file   Social History Narrative  . No narrative on file    Allergies  Allergen Reactions  . Ace Inhibitors Swelling    Lips/ face    Outpatient Medications Prior to Visit  Medication Sig Dispense Refill  . ADDERALL XR 20 MG 24 hr capsule Take 1 capsule by mouth daily. Dr Evelene CroonKaur  0  . ALPRAZolam (XANAX) 1 MG tablet Take 1 mg by mouth at bedtime as needed for anxiety.    Marland Kitchen. dexlansoprazole (DEXILANT) 60 MG capsule Take 1 capsule (60 mg total) by mouth daily. 30 capsule 5  . hydrochlorothiazide (MICROZIDE) 12.5 MG capsule TAKE ONE (1) CAPSULE EACH DAY. 90 capsule 1  . hydrOXYzine (ATARAX/VISTARIL) 50 MG tablet Take 1 tablet (50 mg total) by mouth every 6 (six) hours as needed for anxiety. 40 tablet 3  . norethindrone (MICRONOR,CAMILA,ERRIN) 0.35 MG tablet Take 1 tablet (0.35 mg total) by mouth daily. 1 Package 11  . hydrochlorothiazide (HYDRODIURIL) 12.5 MG tablet Take 1 tablet (12.5 mg total) by mouth daily. 90 tablet 3  . hydrocortisone (ANUSOL-HC) 25 MG suppository Place 1 suppository (25 mg total)  rectally 2 (two) times daily. (Patient not taking: Reported on 11/02/2016) 12 suppository 0  . docusate sodium (COLACE) 50 MG capsule Take 1 capsule (50 mg total) by mouth 2 (two) times daily. (Patient not taking: Reported on 11/02/2016) 60 capsule 0  . ranitidine (ZANTAC) 150 MG tablet Take 1 tablet (150 mg total) by mouth 2 (two) times daily. 60 tablet 11   No facility-administered medications prior to visit.     Review of Systems  Constitutional: Negative for chills, diaphoresis, fever, malaise/fatigue and weight loss.  HENT: Positive for congestion, rhinorrhea, sinus pain, sneezing and sore throat. Negative for ear discharge and ear pain.    Eyes: Negative for blurred vision.  Respiratory: Positive for cough. Negative for sputum production, shortness of breath and wheezing.   Cardiovascular: Negative for chest pain, palpitations and leg swelling.  Gastrointestinal: Negative for abdominal pain, blood in stool, constipation, diarrhea, heartburn, melena, nausea and vomiting.  Genitourinary: Negative for dysuria, frequency, hematuria and urgency.  Musculoskeletal: Negative for back pain, joint pain, myalgias and neck pain.  Skin: Negative for rash.  Neurological: Positive for headaches. Negative for dizziness, tingling, sensory change and focal weakness.  Endo/Heme/Allergies: Negative for environmental allergies and polydipsia. Does not bruise/bleed easily.  Psychiatric/Behavioral: Negative for depression and suicidal ideas. The patient is not nervous/anxious and does not have insomnia.      Objective  Vitals:   02/05/17 0759  BP: 124/80  Pulse: 88  Weight: 229 lb (103.9 kg)  Height: 5\' 4"  (1.626 m)    Physical Exam  Constitutional: She is well-developed, well-nourished, and in no distress. No distress.  HENT:  Head: Normocephalic and atraumatic.  Right Ear: External ear normal.  Left Ear: External ear normal.  Nose: Right sinus exhibits maxillary sinus tenderness. Left sinus exhibits maxillary sinus tenderness.  Mouth/Throat: Oropharynx is clear and moist.  Eyes: Pupils are equal, round, and reactive to light. Conjunctivae and EOM are normal. Right eye exhibits no discharge. Left eye exhibits no discharge.  Neck: Normal range of motion. Neck supple. No JVD present. No thyromegaly present.  Cardiovascular: Normal rate, regular rhythm, normal heart sounds and intact distal pulses.  Exam reveals no gallop and no friction rub.   No murmur heard. Pulmonary/Chest: Effort normal and breath sounds normal. She has no wheezes. She has no rales.  Abdominal: Soft. Bowel sounds are normal. She exhibits no mass. There is no  tenderness. There is no guarding.  Musculoskeletal: Normal range of motion. She exhibits no edema.  Lymphadenopathy:       Head (right side): No submandibular adenopathy present.       Head (left side): No submandibular adenopathy present.    She has no cervical adenopathy.    She has no axillary adenopathy.  Neurological: She is alert. She has normal reflexes.  Skin: Skin is warm and dry. No rash noted. She is not diaphoretic.  Psychiatric: Mood and affect normal.  Nursing note and vitals reviewed.     Assessment & Plan  Problem List Items Addressed This Visit    None    Visit Diagnoses    Acute maxillary sinusitis, recurrence not specified    -  Primary   Relevant Medications   azithromycin (ZITHROMAX) 250 MG tablet   guaiFENesin-codeine (ROBITUSSIN AC) 100-10 MG/5ML syrup      Meds ordered this encounter  Medications  . azithromycin (ZITHROMAX) 250 MG tablet    Sig: 2 today then 1 a day for 4 days    Dispense:  6  tablet    Refill:  1  . guaiFENesin-codeine (ROBITUSSIN AC) 100-10 MG/5ML syrup    Sig: Take 5 mLs by mouth 3 (three) times daily as needed for cough.    Dispense:  150 mL    Refill:  0      Dr. Elizabeth Sauer [facility]  02/05/17

## 2017-02-08 ENCOUNTER — Other Ambulatory Visit: Payer: Self-pay | Admitting: Obstetrics and Gynecology

## 2017-02-19 ENCOUNTER — Encounter: Payer: Self-pay | Admitting: Obstetrics and Gynecology

## 2017-04-03 ENCOUNTER — Ambulatory Visit: Admission: EM | Admit: 2017-04-03 | Discharge: 2017-04-03 | Discharge status: Absent without leave | Payer: 59

## 2017-04-04 ENCOUNTER — Ambulatory Visit: Payer: 59 | Admitting: Family Medicine

## 2017-04-13 ENCOUNTER — Other Ambulatory Visit
Admission: RE | Admit: 2017-04-13 | Discharge: 2017-04-13 | Disposition: A | Discharge status: Home / Self Care | Payer: 59 | Source: Ambulatory Visit | Attending: Family Medicine | Admitting: Family Medicine

## 2017-04-13 ENCOUNTER — Ambulatory Visit (INDEPENDENT_AMBULATORY_CARE_PROVIDER_SITE_OTHER): Payer: 59 | Admitting: Family Medicine

## 2017-04-13 ENCOUNTER — Encounter: Payer: Self-pay | Admitting: Family Medicine

## 2017-04-13 VITALS — BP 132/86 | HR 72 | Temp 98.7°F

## 2017-04-13 DIAGNOSIS — R1033 Periumbilical pain: Secondary | ICD-10-CM

## 2017-04-13 DIAGNOSIS — R109 Unspecified abdominal pain: Secondary | ICD-10-CM | POA: Diagnosis not present

## 2017-04-13 LAB — CBC WITH DIFFERENTIAL/PLATELET
BASOS ABS: 0.1 10*3/uL (ref 0–0.1)
BASOS PCT: 2 %
Eosinophils Absolute: 0.5 10*3/uL (ref 0–0.7)
Eosinophils Relative: 7 %
HEMATOCRIT: 44.1 % (ref 35.0–47.0)
HEMOGLOBIN: 14.9 g/dL (ref 12.0–16.0)
Lymphocytes Relative: 46 %
Lymphs Abs: 3.3 10*3/uL (ref 1.0–3.6)
MCH: 30.2 pg (ref 26.0–34.0)
MCHC: 33.9 g/dL (ref 32.0–36.0)
MCV: 89.2 fL (ref 80.0–100.0)
Monocytes Absolute: 0.6 10*3/uL (ref 0.2–0.9)
Monocytes Relative: 9 %
NEUTROS ABS: 2.5 10*3/uL (ref 1.4–6.5)
NEUTROS PCT: 36 %
Platelets: 309 10*3/uL (ref 150–440)
RBC: 4.95 MIL/uL (ref 3.80–5.20)
RDW: 14.1 % (ref 11.5–14.5)
WBC: 6.9 10*3/uL (ref 3.6–11.0)

## 2017-04-13 LAB — POCT URINALYSIS DIPSTICK
BILIRUBIN UA: NEGATIVE
Glucose, UA: NEGATIVE
Ketones, UA: NEGATIVE
LEUKOCYTES UA: NEGATIVE
NITRITE UA: NEGATIVE
PH UA: 7 (ref 5.0–8.0)
PROTEIN UA: NEGATIVE
Spec Grav, UA: 1.02 (ref 1.010–1.025)
Urobilinogen, UA: NEGATIVE E.U./dL — AB

## 2017-04-13 MED ORDER — TRAMADOL HCL 50 MG PO TABS: 50.0000 mg | ORAL_TABLET | Freq: Three times a day (TID) | ORAL | 0 refills | Status: DC | PRN

## 2017-04-13 MED ORDER — PROMETHAZINE HCL 25 MG PO TABS: 25.0000 mg | ORAL_TABLET | Freq: Three times a day (TID) | ORAL | 0 refills | Status: DC | PRN

## 2017-04-13 NOTE — Patient Instructions (Signed)
Flank Pain, Adult Flank pain is pain that is located on the side of the body between the upper abdomen and the back. This area is called the flank. The pain may occur over a short period of time (acute), or it may be long-term or recurring (chronic). It may be mild or severe. Flank pain can be caused by many things, including:  Muscle soreness or injury.  Kidney stones or kidney disease.  Stress.  A disease of the spine (vertebral disk disease).  A lung infection (pneumonia).  Fluid around the lungs (pulmonary edema).  A skin rash caused by the chickenpox virus (shingles).  Tumors that affect the back of the abdomen.  Gallbladder disease.  Follow these instructions at home:  Drink enough fluid to keep your urine clear or pale yellow.  Rest as told by your health care provider.  Take over-the-counter and prescription medicines only as told by your health care provider.  Keep a journal to track what has caused your flank pain and what has made it feel better.  Keep all follow-up visits as told by your health care provider. This is important. Contact a health care provider if:  Your pain is not controlled with medicine.  You have new symptoms.  Your pain gets worse.  You have a fever.  Your symptoms last longer than 2-3 days.  You have trouble urinating or you are urinating very frequently. Get help right away if:  You have trouble breathing or you are short of breath.  Your abdomen hurts or it is swollen or red.  You have nausea or vomiting.  You feel faint or you pass out.  You have blood in your urine. Summary  Flank pain is pain that is located on the side of the body between the upper abdomen and the back.  The pain may occur over a short period of time (acute), or it may be long-term or recurring (chronic). It may be mild or severe.  Flank pain can be caused by many things.  Contact your health care provider if your symptoms get worse or they last  longer than 2-3 days. This information is not intended to replace advice given to you by your health care provider. Make sure you discuss any questions you have with your health care provider. Document Released: 08/03/2005 Document Revised: 08/25/2016 Document Reviewed: 08/25/2016 Elsevier Interactive Patient Education  2018 [employer]  

## 2017-04-13 NOTE — Progress Notes (Signed)
Name: Brenda [name]   MRN: [id number]    DOB: 1980/09/10   Date:04/13/2017       Progress Note  Subjective  Chief Complaint  Chief Complaint  Patient presents with  . Abdominal Pain    Abdominal Pain  This is a new problem. The current episode started yesterday. The onset quality is sudden. The problem occurs constantly. The problem has been waxing and waning. The pain is located in the periumbilical region and RLQ. The pain is at a severity of 5/10. The pain is moderate. The quality of the pain is colicky. The abdominal pain radiates to the back. Associated symptoms include nausea. Pertinent negatives include no constipation, diarrhea, dysuria, fever, frequency, headaches, hematochezia, hematuria, melena, myalgias, vomiting or weight loss. Nothing aggravates the pain. The pain is relieved by nothing. There is no history of abdominal surgery or pancreatitis.    No problem-specific Assessment & Plan notes found for this encounter.   Past Medical History:  Diagnosis Date  . ADHD, adult residual type   . Anxiety   . Depression   . GERD (gastroesophageal reflux disease)   . Hypertension   . Wears contact lenses     Past Surgical History:  Procedure Laterality Date  . BREAST REDUCTION SURGERY    . COLONOSCOPY WITH PROPOFOL N/A 11/09/2016   Procedure: COLONOSCOPY WITH PROPOFOL;  Surgeon: [name] Minium, MD;  Location: [facility];  Service: Endoscopy;  Laterality: N/A;  . ESOPHAGOGASTRODUODENOSCOPY (EGD) WITH PROPOFOL N/A 11/09/2016   Procedure: ESOPHAGOGASTRODUODENOSCOPY (EGD) WITH PROPOFOL;  Surgeon: [name] Minium, MD;  Location: [facility];  Service: Endoscopy;  Laterality: N/A;  . TONSILLECTOMY      Family History  Problem Relation Age of Onset  . Heart disease Maternal Grandmother   . Heart disease Maternal Grandfather   . Diabetes Paternal Grandfather     Social History   Social History  . Marital status: Married    Spouse name: N/A  . Number of  children: N/A  . Years of education: N/A   Occupational History  . Not on file.   Social History Main Topics  . Smoking status: Never Smoker  . Smokeless tobacco: Never Used  . Alcohol use 6.0 oz/week    5 Glasses of wine, 5 Cans of beer per week  . Drug use: No  . Sexual activity: Yes    Birth control/ protection: Pill   Other Topics Concern  . Not on file   Social History Narrative  . No narrative on file    Allergies  Allergen Reactions  . Ace Inhibitors Swelling    Lips/ face    Outpatient Medications Prior to Visit  Medication Sig Dispense Refill  . ADDERALL XR 20 MG 24 hr capsule Take 1 capsule by mouth daily. Dr Evelene Croon  0  . ALPRAZolam (XANAX) 1 MG tablet Take 1 mg by mouth at bedtime as needed for anxiety.    Marland Kitchen azithromycin (ZITHROMAX) 250 MG tablet 2 today then 1 a day for 4 days 6 tablet 1  . dexlansoprazole (DEXILANT) 60 MG capsule Take 1 capsule (60 mg total) by mouth daily. 30 capsule 5  . guaiFENesin-codeine (ROBITUSSIN AC) 100-10 MG/5ML syrup Take 5 mLs by mouth 3 (three) times daily as needed for cough. 150 mL 0  . hydrochlorothiazide (MICROZIDE) 12.5 MG capsule TAKE ONE (1) CAPSULE EACH DAY. 90 capsule 1  . hydrocortisone (ANUSOL-HC) 25 MG suppository Place 1 suppository (25 mg total) rectally 2 (two) times daily. (Patient not taking:  Reported on 11/02/2016) 12 suppository 0  . hydrOXYzine (ATARAX/VISTARIL) 50 MG tablet Take 1 tablet (50 mg total) by mouth every 6 (six) hours as needed for anxiety. 40 tablet 3  . norethindrone (MICRONOR,CAMILA,ERRIN) 0.35 MG tablet Take 1 tablet (0.35 mg total) by mouth daily. 1 Package 11   No facility-administered medications prior to visit.     Review of Systems  Constitutional: Negative for chills, fever, malaise/fatigue and weight loss.  HENT: Negative for ear discharge, ear pain and sore throat.   Eyes: Negative for blurred vision.  Respiratory: Negative for cough, sputum production, shortness of breath and wheezing.    Cardiovascular: Negative for chest pain, palpitations and leg swelling.  Gastrointestinal: Positive for abdominal pain and nausea. Negative for blood in stool, constipation, diarrhea, heartburn, hematochezia, melena and vomiting.  Genitourinary: Negative for dysuria, frequency, hematuria and urgency.  Musculoskeletal: Negative for back pain, joint pain, myalgias and neck pain.  Skin: Negative for rash.  Neurological: Negative for dizziness, tingling, sensory change, focal weakness and headaches.  Endo/Heme/Allergies: Negative for environmental allergies and polydipsia. Does not bruise/bleed easily.  Psychiatric/Behavioral: Negative for depression and suicidal ideas. The patient is not nervous/anxious and does not have insomnia.      Objective  Vitals:   04/13/17 0803  BP: 132/86  Pulse: 72  Temp: 98.7 F (37.1 C)    Physical Exam  Constitutional: She is well-developed, well-nourished, and in no distress. No distress.  HENT:  Head: Normocephalic and atraumatic.  Right Ear: External ear normal.  Left Ear: External ear normal.  Nose: Nose normal.  Mouth/Throat: Oropharynx is clear and moist.  Eyes: Pupils are equal, round, and reactive to light. Conjunctivae and EOM are normal. Right eye exhibits no discharge. Left eye exhibits no discharge.  Neck: Normal range of motion. Neck supple. No JVD present. No thyromegaly present.  Cardiovascular: Normal rate, regular rhythm, normal heart sounds and intact distal pulses.  Exam reveals no gallop and no friction rub.   No murmur heard. Pulmonary/Chest: Effort normal and breath sounds normal. She has no wheezes. She has no rales.  Abdominal: Soft. Bowel sounds are normal. She exhibits no mass. There is no hepatosplenomegaly. There is tenderness in the right lower quadrant. There is no rigidity, no rebound, no guarding, no CVA tenderness, no tenderness at McBurney's point and negative Murphy's sign.  Musculoskeletal: Normal range of motion.  She exhibits no edema.  Lymphadenopathy:    She has no cervical adenopathy.  Neurological: She is alert. She has normal reflexes.  Skin: Skin is warm, dry and intact. No rash noted. She is not diaphoretic.  Psychiatric: Mood and affect normal.  Nursing note and vitals reviewed.     Assessment & Plan  Problem List Items Addressed This Visit    None    Visit Diagnoses    Periumbilical abdominal pain    -  Primary   stat cbc/?stone/early appendicitis   Relevant Medications   traMADol (ULTRAM) 50 MG tablet   Other Relevant Orders   POCT urinalysis dipstick (Completed)      Meds ordered this encounter  Medications  . traMADol (ULTRAM) 50 MG tablet    Sig: Take 1 tablet (50 mg total) by mouth every 8 (eight) hours as needed.    Dispense:  15 tablet    Refill:  0  . promethazine (PHENERGAN) 25 MG tablet    Sig: Take 1 tablet (25 mg total) by mouth every 8 (eight) hours as needed for nausea or vomiting.  Dispense:  20 tablet    Refill:  0   Sent downstairs for cbc   Dr. Hayden [facility]  04/13/17

## 2017-04-30 ENCOUNTER — Ambulatory Visit: Payer: 59 | Admitting: Family Medicine

## 2017-04-30 ENCOUNTER — Encounter: Payer: Self-pay | Admitting: Family Medicine

## 2017-04-30 VITALS — BP 120/90 | HR 70 | Ht 64.0 in | Wt 229.0 lb

## 2017-04-30 DIAGNOSIS — N3090 Cystitis, unspecified without hematuria: Secondary | ICD-10-CM | POA: Diagnosis not present

## 2017-04-30 LAB — POCT URINALYSIS DIPSTICK
Bilirubin, UA: NEGATIVE
Glucose, UA: NEGATIVE
KETONES UA: NEGATIVE
Nitrite, UA: POSITIVE
SPEC GRAV UA: 1.01 (ref 1.010–1.025)
UROBILINOGEN UA: 0.2 U/dL
pH, UA: 6 (ref 5.0–8.0)

## 2017-04-30 MED ORDER — CIPROFLOXACIN HCL 250 MG PO TABS: 250.0000 mg | ORAL_TABLET | Freq: Two times a day (BID) | ORAL | 0 refills | Status: DC

## 2017-04-30 NOTE — Progress Notes (Signed)
Name: Brenda HalstedVirginia Tate [name]   MRN: [id number]    DOB: 09/06/1980   Date:04/30/2017       Progress Note  Subjective  Chief Complaint  Chief Complaint  Patient presents with  . Urinary Tract Infection    hurting in lower abdomen    Urinary Tract Infection   This is a recurrent problem. The current episode started yesterday. The problem occurs intermittently. The problem has been waxing and waning. The quality of the pain is described as burning. The pain is at a severity of 4/10. The pain is moderate. There has been no fever. Associated symptoms include chills, a discharge, frequency and urgency. Pertinent negatives include no flank pain, hematuria, hesitancy, nausea, sweats or vomiting. Associated symptoms comments: cloudy. She has tried antibiotics for the symptoms. Improvement on treatment: recurred within 3 weeks.    No problem-specific Assessment & Plan notes found for this encounter.   Past Medical History:  Diagnosis Date  . ADHD, adult residual type   . Anxiety   . Depression   . GERD (gastroesophageal reflux disease)   . Hypertension   . Wears contact lenses     Past Surgical History:  Procedure Laterality Date  . BREAST REDUCTION SURGERY    . TONSILLECTOMY      Family History  Problem Relation Age of Onset  . Heart disease Maternal Grandmother   . Heart disease Maternal Grandfather   . Diabetes Paternal Grandfather     Social History   Socioeconomic History  . Marital status: Married    Spouse name: Not on file  . Number of children: Not on file  . Years of education: Not on file  . Highest education level: Not on file  Social Needs  . Financial resource strain: Not on file  . Food insecurity - worry: Not on file  . Food insecurity - inability: Not on file  . Transportation needs - medical: Not on file  . Transportation needs - non-medical: Not on file  Occupational History  . Not on file  Tobacco Use  . Smoking status: Never Smoker  . Smokeless  tobacco: Never Used  Substance and Sexual Activity  . Alcohol use: Yes    Alcohol/week: 6.0 oz    Types: 5 Glasses of wine, 5 Cans of beer per week  . Drug use: No  . Sexual activity: Yes    Birth control/protection: Pill  Other Topics Concern  . Not on file  Social History Narrative  . Not on file    Allergies  Allergen Reactions  . Ace Inhibitors Swelling    Lips/ face    Outpatient Medications Prior to Visit  Medication Sig Dispense Refill  . ADDERALL XR 20 MG 24 hr capsule Take 1 capsule by mouth daily. Dr Evelene CroonKaur  0  . ALPRAZolam (XANAX) 1 MG tablet Take 1 mg by mouth at bedtime as needed for anxiety.    Marland Kitchen. dexlansoprazole (DEXILANT) 60 MG capsule Take 1 capsule (60 mg total) by mouth daily. 30 capsule 5  . hydrochlorothiazide (MICROZIDE) 12.5 MG capsule TAKE ONE (1) CAPSULE EACH DAY. 90 capsule 1  . norethindrone (MICRONOR,CAMILA,ERRIN) 0.35 MG tablet Take 1 tablet (0.35 mg total) by mouth daily. 1 Package 11  . hydrOXYzine (ATARAX/VISTARIL) 50 MG tablet Take 1 tablet (50 mg total) by mouth every 6 (six) hours as needed for anxiety. (Patient not taking: Reported on 04/30/2017) 40 tablet 3  . azithromycin (ZITHROMAX) 250 MG tablet 2 today then 1 a day for 4 days  6 tablet 1  . guaiFENesin-codeine (ROBITUSSIN AC) 100-10 MG/5ML syrup Take 5 mLs by mouth 3 (three) times daily as needed for cough. 150 mL 0  . hydrocortisone (ANUSOL-HC) 25 MG suppository Place 1 suppository (25 mg total) rectally 2 (two) times daily. (Patient not taking: Reported on 11/02/2016) 12 suppository 0  . promethazine (PHENERGAN) 25 MG tablet Take 1 tablet (25 mg total) by mouth every 8 (eight) hours as needed for nausea or vomiting. 20 tablet 0  . traMADol (ULTRAM) 50 MG tablet Take 1 tablet (50 mg total) by mouth every 8 (eight) hours as needed. 15 tablet 0   No facility-administered medications prior to visit.     Review of Systems  Constitutional: Positive for chills. Negative for fever, malaise/fatigue  and weight loss.  HENT: Negative for ear discharge, ear pain and sore throat.   Eyes: Negative for blurred vision.  Respiratory: Negative for cough, sputum production, shortness of breath and wheezing.   Cardiovascular: Negative for chest pain, palpitations and leg swelling.  Gastrointestinal: Negative for abdominal pain, blood in stool, constipation, diarrhea, heartburn, melena, nausea and vomiting.  Genitourinary: Positive for frequency and urgency. Negative for dysuria, flank pain, hematuria and hesitancy.  Musculoskeletal: Negative for back pain, joint pain, myalgias and neck pain.  Skin: Negative for rash.  Neurological: Negative for dizziness, tingling, sensory change, focal weakness and headaches.  Endo/Heme/Allergies: Negative for environmental allergies and polydipsia. Does not bruise/bleed easily.  Psychiatric/Behavioral: Negative for depression and suicidal ideas. The patient is not nervous/anxious and does not have insomnia.      Objective  Vitals:   04/30/17 0804  BP: 120/90  Pulse: 70  Weight: 229 lb (103.9 kg)  Height: 5\' 4"  (1.626 m)    Physical Exam  Constitutional: She is well-developed, well-nourished, and in no distress. No distress.  HENT:  Head: Normocephalic and atraumatic.  Right Ear: External ear normal.  Left Ear: External ear normal.  Nose: Nose normal.  Mouth/Throat: Oropharynx is clear and moist.  Eyes: Conjunctivae and EOM are normal. Pupils are equal, round, and reactive to light. Right eye exhibits no discharge. Left eye exhibits no discharge.  Neck: Normal range of motion. Neck supple. No JVD present. No thyromegaly present.  Cardiovascular: Normal rate, regular rhythm, normal heart sounds and intact distal pulses. Exam reveals no gallop and no friction rub.  No murmur heard. Pulmonary/Chest: Effort normal and breath sounds normal. She has no wheezes. She has no rales.  Abdominal: Soft. Bowel sounds are normal. She exhibits no mass. There is no  tenderness. There is no guarding.  Musculoskeletal: Normal range of motion. She exhibits no edema.  Lymphadenopathy:    She has no cervical adenopathy.  Neurological: She is alert. She has normal reflexes.  Skin: Skin is warm and dry. She is not diaphoretic.  Psychiatric: Mood and affect normal.  Nursing note and vitals reviewed.     Assessment & Plan  Problem List Items Addressed This Visit    None    Visit Diagnoses    Cystitis    -  Primary   Relevant Orders   POCT urinalysis dipstick (Completed)      Meds ordered this encounter  Medications  . ciprofloxacin (CIPRO) 250 MG tablet    Sig: Take 1 tablet (250 mg total) 2 (two) times daily by mouth.    Dispense:  10 tablet    Refill:  0      Dr. Hayden [facility]  04/30/17

## 2017-06-05 ENCOUNTER — Ambulatory Visit: Payer: 59 | Admitting: Family Medicine

## 2017-06-05 ENCOUNTER — Encounter: Payer: Self-pay | Admitting: Family Medicine

## 2017-06-05 VITALS — BP 120/84 | HR 84 | Temp 99.2°F | Ht 64.0 in | Wt 227.0 lb

## 2017-06-05 DIAGNOSIS — J020 Streptococcal pharyngitis: Secondary | ICD-10-CM | POA: Diagnosis not present

## 2017-06-05 LAB — POCT RAPID STREP A (OFFICE): RAPID STREP A SCREEN: POSITIVE — AB

## 2017-06-05 MED ORDER — AMOXICILLIN 500 MG PO CAPS: 500.0000 mg | ORAL_CAPSULE | Freq: Three times a day (TID) | ORAL | 0 refills | Status: DC

## 2017-06-05 NOTE — Progress Notes (Signed)
Name: Brenda [name]   MRN: [id number]    DOB: 02-19-1981   Date:06/05/2017       Progress Note  Subjective  Chief Complaint  Chief Complaint  Patient presents with  . Sinusitis    sore throat, headache, no cough, nasal congestion- using Afrin    Sinusitis  This is a new problem. The current episode started in the past 7 days (saturday). The problem has been gradually worsening since onset. The maximum temperature recorded prior to her arrival was 100.4 - 100.9 F. The fever has been present for 1 to 2 days. The pain is mild. Associated symptoms include chills, congestion, diaphoresis, ear pain, a hoarse voice, a sore throat and swollen glands. Pertinent negatives include no coughing, headaches, neck pain, shortness of breath, sinus pressure or sneezing. Past treatments include oral decongestants (advil). The treatment provided no relief.  Sore Throat   This is a new problem. The current episode started in the past 7 days. The problem has been gradually worsening. The pain is worse on the right side. The pain is moderate. Associated symptoms include congestion, ear pain, a hoarse voice and swollen glands. Pertinent negatives include no abdominal pain, coughing, diarrhea, ear discharge, headaches, neck pain, shortness of breath or trouble swallowing. She has tried NSAIDs for the symptoms.    No problem-specific Assessment & Plan notes found for this encounter.   Past Medical History:  Diagnosis Date  . ADHD, adult residual type   . Anxiety   . Depression   . GERD (gastroesophageal reflux disease)   . Hypertension   . Wears contact lenses     Past Surgical History:  Procedure Laterality Date  . BREAST REDUCTION SURGERY    . COLONOSCOPY WITH PROPOFOL N/A 11/09/2016   Procedure: COLONOSCOPY WITH PROPOFOL;  Surgeon: [name] Minium, MD;  Location: [facility];  Service: Endoscopy;  Laterality: N/A;  . ESOPHAGOGASTRODUODENOSCOPY (EGD) WITH PROPOFOL N/A 11/09/2016   Procedure:  ESOPHAGOGASTRODUODENOSCOPY (EGD) WITH PROPOFOL;  Surgeon: [name] Minium, MD;  Location: [facility];  Service: Endoscopy;  Laterality: N/A;  . TONSILLECTOMY      Family History  Problem Relation Age of Onset  . Heart disease Maternal Grandmother   . Heart disease Maternal Grandfather   . Diabetes Paternal Grandfather     Social History   Socioeconomic History  . Marital status: Married    Spouse name: Not on file  . Number of children: Not on file  . Years of education: Not on file  . Highest education level: Not on file  Social Needs  . Financial resource strain: Not on file  . Food insecurity - worry: Not on file  . Food insecurity - inability: Not on file  . Transportation needs - medical: Not on file  . Transportation needs - non-medical: Not on file  Occupational History  . Not on file  Tobacco Use  . Smoking status: Never Smoker  . Smokeless tobacco: Never Used  Substance and Sexual Activity  . Alcohol use: Yes    Alcohol/week: 6.0 oz    Types: 5 Glasses of wine, 5 Cans of beer per week  . Drug use: No  . Sexual activity: Yes    Birth control/protection: Pill  Other Topics Concern  . Not on file  Social History Narrative  . Not on file    Allergies  Allergen Reactions  . Ace Inhibitors Swelling    Lips/ face    Outpatient Medications Prior to Visit  Medication Sig Dispense  Refill  . ADDERALL XR 20 MG 24 hr capsule Take 1 capsule by mouth daily. Dr Evelene CroonKaur  0  . ALPRAZolam (XANAX) 1 MG tablet Take 1 mg by mouth at bedtime as needed for anxiety.    Marland Kitchen. dexlansoprazole (DEXILANT) 60 MG capsule Take 1 capsule (60 mg total) by mouth daily. 30 capsule 5  . hydrochlorothiazide (MICROZIDE) 12.5 MG capsule TAKE ONE (1) CAPSULE EACH DAY. 90 capsule 1  . norethindrone (MICRONOR,CAMILA,ERRIN) 0.35 MG tablet Take 1 tablet (0.35 mg total) by mouth daily. 1 Package 11  . traZODone (DESYREL) 50 MG tablet Take 50 mg by mouth at bedtime. 1-3 tabs at bedtime Dr Evelene CroonKaur    .  ciprofloxacin (CIPRO) 250 MG tablet Take 1 tablet (250 mg total) 2 (two) times daily by mouth. 10 tablet 0  . hydrOXYzine (ATARAX/VISTARIL) 50 MG tablet Take 1 tablet (50 mg total) by mouth every 6 (six) hours as needed for anxiety. (Patient not taking: Reported on 04/30/2017) 40 tablet 3   No facility-administered medications prior to visit.     Review of Systems  Constitutional: Positive for chills and diaphoresis. Negative for fever, malaise/fatigue and weight loss.  HENT: Positive for congestion, ear pain, hoarse voice and sore throat. Negative for ear discharge, sinus pressure, sneezing and trouble swallowing.   Eyes: Negative for blurred vision.  Respiratory: Negative for cough, sputum production, shortness of breath and wheezing.   Cardiovascular: Negative for chest pain, palpitations and leg swelling.  Gastrointestinal: Negative for abdominal pain, blood in stool, constipation, diarrhea, heartburn, melena and nausea.  Genitourinary: Negative for dysuria, frequency, hematuria and urgency.  Musculoskeletal: Negative for back pain, joint pain, myalgias and neck pain.  Skin: Negative for rash.  Neurological: Negative for dizziness, tingling, sensory change, focal weakness and headaches.  Endo/Heme/Allergies: Negative for environmental allergies and polydipsia. Does not bruise/bleed easily.  Psychiatric/Behavioral: Negative for depression and suicidal ideas. The patient is not nervous/anxious and does not have insomnia.      Objective  Vitals:   06/05/17 1141  BP: 120/84  Pulse: 84  Temp: 99.2 F (37.3 C)  TempSrc: Oral  Weight: 227 lb (103 kg)  Height: 5\' 4"  (1.626 m)    Physical Exam  Constitutional: She is well-developed, well-nourished, and in no distress. No distress.  HENT:  Head: Normocephalic and atraumatic.  Right Ear: External ear normal.  Left Ear: External ear normal.  Nose: Nose normal.  Mouth/Throat: Uvula is midline. Posterior oropharyngeal erythema present.  No oropharyngeal exudate, posterior oropharyngeal edema or tonsillar abscesses.  Eyes: Conjunctivae and EOM are normal. Pupils are equal, round, and reactive to light. Right eye exhibits no discharge. Left eye exhibits no discharge.  Neck: Normal range of motion. Neck supple. No JVD present. No thyromegaly present.  Cardiovascular: Normal rate, regular rhythm, normal heart sounds and intact distal pulses. Exam reveals no gallop and no friction rub.  No murmur heard. Pulmonary/Chest: Effort normal and breath sounds normal. She has no wheezes. She has no rales.  Abdominal: Soft. Bowel sounds are normal. She exhibits no mass. There is no tenderness. There is no guarding.  Musculoskeletal: Normal range of motion. She exhibits no edema.  Lymphadenopathy:    She has no cervical adenopathy.  Neurological: She is alert. She has normal reflexes.  Skin: Skin is warm and dry. She is not diaphoretic.  Psychiatric: Mood and affect normal.  Nursing note and vitals reviewed.     Assessment & Plan  Problem List Items Addressed This Visit  None    Visit Diagnoses    Strep pharyngitis    -  Primary   Relevant Medications   amoxicillin (AMOXIL) 500 MG capsule   Other Relevant Orders   POCT rapid strep A (Completed)      Meds ordered this encounter  Medications  . amoxicillin (AMOXIL) 500 MG capsule    Sig: Take 1 capsule (500 mg total) by mouth 3 (three) times daily.    Dispense:  30 capsule    Refill:  0      Dr. Elizabeth [facility]reanna Jones [facility]  06/05/17

## 2017-06-05 NOTE — Patient Instructions (Signed)

## 2017-06-07 ENCOUNTER — Other Ambulatory Visit
Admission: RE | Admit: 2017-06-07 | Discharge: 2017-06-07 | Disposition: A | Discharge status: Home / Self Care | Payer: 59 | Source: Ambulatory Visit | Attending: Family Medicine | Admitting: Family Medicine

## 2017-06-07 ENCOUNTER — Ambulatory Visit: Payer: 59 | Admitting: Family Medicine

## 2017-06-07 ENCOUNTER — Encounter: Payer: Self-pay | Admitting: Family Medicine

## 2017-06-07 VITALS — BP 128/80 | HR 79 | Temp 97.7°F | Ht 64.0 in | Wt 222.0 lb

## 2017-06-07 DIAGNOSIS — J0100 Acute maxillary sinusitis, unspecified: Secondary | ICD-10-CM | POA: Diagnosis not present

## 2017-06-07 DIAGNOSIS — E860 Dehydration: Secondary | ICD-10-CM | POA: Diagnosis not present

## 2017-06-07 LAB — CBC
HEMATOCRIT: 45.5 % (ref 35.0–47.0)
HEMOGLOBIN: 15.4 g/dL (ref 12.0–16.0)
MCH: 30.2 pg (ref 26.0–34.0)
MCHC: 33.9 g/dL (ref 32.0–36.0)
MCV: 89.3 fL (ref 80.0–100.0)
Platelets: 382 10*3/uL (ref 150–440)
RBC: 5.1 MIL/uL (ref 3.80–5.20)
RDW: 12.9 % (ref 11.5–14.5)
WBC: 15 10*3/uL — ABNORMAL HIGH (ref 3.6–11.0)

## 2017-06-07 LAB — RENAL FUNCTION PANEL
ALBUMIN: 4 g/dL (ref 3.5–5.0)
ANION GAP: 12 (ref 5–15)
BUN: 9 mg/dL (ref 6–20)
CALCIUM: 9.4 mg/dL (ref 8.9–10.3)
CO2: 25 mmol/L (ref 22–32)
CREATININE: 0.7 mg/dL (ref 0.44–1.00)
Chloride: 102 mmol/L (ref 101–111)
GFR calc Af Amer: >60 mL/min (ref >60)
GFR calc non Af Amer: >60 mL/min (ref >60)
GLUCOSE: 108 mg/dL — AB (ref 65–99)
POTASSIUM: 3.3 mmol/L — AB (ref 3.5–5.1)
Phosphorus: 2 mg/dL — ABNORMAL LOW (ref 2.5–4.6)
Sodium: 139 mmol/L (ref 135–145)

## 2017-06-07 MED ORDER — CEFUROXIME AXETIL 500 MG PO TABS: 500.0000 mg | ORAL_TABLET | Freq: Two times a day (BID) | ORAL | 0 refills | Status: DC

## 2017-06-07 NOTE — Progress Notes (Signed)
Name: Brenda [name]   MRN: [id number]    DOB: 08/05/1980   Date:06/07/2017       Progress Note  Subjective  Chief Complaint  Chief Complaint  Patient presents with  . Sore Throat    Started saturday- Sore Throat unsure of fever currently. Runny nose- green and clear production coming out. Green bowel movements. Has not ate since Monday. Holding down fluids. Feeling faint when standing.      Sore Throat   This is a recurrent problem. The current episode started in the past 7 days. The problem has been gradually worsening. Sore throat worse side: better. There has been no fever. The pain is mild. Associated symptoms include congestion, ear pain, headaches, a plugged ear sensation and shortness of breath. Pertinent negatives include no abdominal pain, coughing, diarrhea, drooling, ear discharge, hoarse voice, neck pain, stridor, swollen glands, trouble swallowing or vomiting. Associated symptoms comments: Vertigo/sinus discharge/yellow with blood. She has had exposure to strep. She has tried NSAIDs for the symptoms. The treatment provided no relief.    No problem-specific Assessment & Plan notes found for this encounter.   Past Medical History:  Diagnosis Date  . ADHD, adult residual type   . Anxiety   . Depression   . GERD (gastroesophageal reflux disease)   . Hypertension   . Wears contact lenses     Past Surgical History:  Procedure Laterality Date  . BREAST REDUCTION SURGERY    . COLONOSCOPY WITH PROPOFOL N/A 11/09/2016   Procedure: COLONOSCOPY WITH PROPOFOL;  Surgeon: [name] MiniumWohl, Darren, MD;  [facility];  Service: Endoscopy;  Laterality: N/A;  . ESOPHAGOGASTRODUODENOSCOPY (EGD) WITH PROPOFOL N/A 11/09/2016   Procedure: ESOPHAGOGASTRODUODENOSCOPY (EGD) WITH PROPOFOL;  Surgeon: [name] MiniumWohl, Darren, MD;  [facility];  Service: Endoscopy;  Laterality: N/A;  . TONSILLECTOMY      Family History  Problem Relation Age of Onset  . Heart disease Maternal  Grandmother   . Heart disease Maternal Grandfather   . Diabetes Paternal Grandfather     Social History   Socioeconomic History  . Marital status: Married    Spouse name: Not on file  . Number of children: Not on file  . Years of education: Not on file  . Highest education level: Not on file  Social Needs  . Financial resource strain: Not on file  . Food insecurity - worry: Not on file  . Food insecurity - inability: Not on file  . Transportation needs - medical: Not on file  . Transportation needs - non-medical: Not on file  Occupational History  . Not on file  Tobacco Use  . Smoking status: Never Smoker  . Smokeless tobacco: Never Used  Substance and Sexual Activity  . Alcohol use: Yes    Alcohol/week: 6.0 oz    Types: 5 Glasses of wine, 5 Cans of beer per week  . Drug use: No  . Sexual activity: Yes    Birth control/protection: Pill  Other Topics Concern  . Not on file  Social History Narrative  . Not on file    Allergies  Allergen Reactions  . Ace Inhibitors Swelling    Lips/ face    Outpatient Medications Prior to Visit  Medication Sig Dispense Refill  . ADDERALL XR 20 MG 24 hr capsule Take 1 capsule by mouth daily. Dr Evelene CroonKaur  0  . ALPRAZolam (XANAX) 1 MG tablet Take 1 mg by mouth at bedtime as needed for anxiety.    Marland Kitchen. amoxicillin (AMOXIL) 500  MG capsule Take 1 capsule (500 mg total) by mouth 3 (three) times daily. 30 capsule 0  . dexlansoprazole (DEXILANT) 60 MG capsule Take 1 capsule (60 mg total) by mouth daily. 30 capsule 5  . hydrochlorothiazide (MICROZIDE) 12.5 MG capsule TAKE ONE (1) CAPSULE EACH DAY. 90 capsule 1  . norethindrone (MICRONOR,CAMILA,ERRIN) 0.35 MG tablet Take 1 tablet (0.35 mg total) by mouth daily. 1 Package 11  . traZODone (DESYREL) 50 MG tablet Take 50 mg by mouth at bedtime. 1-3 tabs at bedtime Dr Evelene Croon     No facility-administered medications prior to visit.     Review of Systems  Constitutional: Positive for chills and  malaise/fatigue. Negative for diaphoresis, fever and weight loss.  HENT: Positive for congestion, ear pain and sinus pain. Negative for drooling, ear discharge, hearing loss, hoarse voice, nosebleeds, sore throat, tinnitus and trouble swallowing.   Eyes: Negative for blurred vision.  Respiratory: Positive for shortness of breath. Negative for cough, sputum production, wheezing and stridor.   Cardiovascular: Negative for chest pain, palpitations and leg swelling.  Gastrointestinal: Negative for abdominal pain, blood in stool, constipation, diarrhea, heartburn, melena, nausea and vomiting.  Genitourinary: Negative for dysuria, frequency, hematuria and urgency.  Musculoskeletal: Negative for back pain, joint pain, myalgias and neck pain.  Skin: Negative for rash.  Neurological: Positive for headaches. Negative for dizziness, tingling, sensory change, focal weakness and weakness.  Endo/Heme/Allergies: Negative for environmental allergies and polydipsia. Does not bruise/bleed easily.  Psychiatric/Behavioral: Negative for depression and suicidal ideas. The patient is not nervous/anxious and does not have insomnia.      Objective  Vitals:   06/07/17 1336  BP: 128/80  Pulse: 79  Temp: 97.7 F (36.5 C)  TempSrc: Oral  SpO2: 100%  Weight: 222 lb (100.7 kg)  Height: 5\' 4"  (1.626 m)    Physical Exam  Constitutional: She is well-developed, well-nourished, and in no distress. No distress.  HENT:  Head: Normocephalic and atraumatic.  Right Ear: External ear normal.  Left Ear: External ear normal.  Nose: Nose normal.  Mouth/Throat: Oropharynx is clear and moist.  Eyes: Conjunctivae and EOM are normal. Pupils are equal, round, and reactive to light. Right eye exhibits no discharge. Left eye exhibits no discharge.  Neck: Normal range of motion. Neck supple. No JVD present. No thyromegaly present.  Cardiovascular: Normal rate, regular rhythm, normal heart sounds and intact distal pulses. Exam  reveals no gallop and no friction rub.  No murmur heard. Pulmonary/Chest: Effort normal and breath sounds normal. She has no wheezes. She has no rales.  Abdominal: Soft. Bowel sounds are normal. She exhibits no mass. There is no tenderness. There is no guarding.  Musculoskeletal: Normal range of motion. She exhibits no edema.  Lymphadenopathy:    She has no cervical adenopathy.  Neurological: She is alert. She has normal reflexes.  Skin: Skin is warm and dry. She is not diaphoretic.  Psychiatric: Mood and affect normal.  Nursing note and vitals reviewed.     Assessment & Plan  Problem List Items Addressed This Visit    None    Visit Diagnoses    Acute maxillary sinusitis, recurrence not specified    -  Primary   Relevant Medications   cefUROXime (CEFTIN) 500 MG tablet   Other Relevant Orders   CBC   Renal Function Panel   Dehydration       Relevant Medications   cefUROXime (CEFTIN) 500 MG tablet   Other Relevant Orders   CBC   Renal  Function Panel      Meds ordered this encounter  Medications  . cefUROXime (CEFTIN) 500 MG tablet    Sig: Take 1 tablet (500 mg total) by mouth 2 (two) times daily with a meal.    Dispense:  16 tablet    Refill:  0      Dr. Hayden Rasmusseneanna Jones [facility]  06/07/17

## 2017-07-12 ENCOUNTER — Other Ambulatory Visit: Payer: Self-pay

## 2017-07-12 MED ORDER — HYDROCHLOROTHIAZIDE 12.5 MG PO CAPS: ORAL_CAPSULE | ORAL | 0 refills | Status: DC

## 2017-10-11 ENCOUNTER — Encounter: Payer: Self-pay | Admitting: Family Medicine

## 2017-10-11 ENCOUNTER — Ambulatory Visit: Payer: 59 | Admitting: Family Medicine

## 2017-10-11 VITALS — BP 160/100 | HR 100 | Ht 64.0 in | Wt 230.0 lb

## 2017-10-11 DIAGNOSIS — I10 Essential (primary) hypertension: Secondary | ICD-10-CM

## 2017-10-11 DIAGNOSIS — K219 Gastro-esophageal reflux disease without esophagitis: Secondary | ICD-10-CM | POA: Diagnosis not present

## 2017-10-11 DIAGNOSIS — E860 Dehydration: Secondary | ICD-10-CM | POA: Diagnosis not present

## 2017-10-11 DIAGNOSIS — J0100 Acute maxillary sinusitis, unspecified: Secondary | ICD-10-CM | POA: Diagnosis not present

## 2017-10-11 MED ORDER — CEFUROXIME AXETIL 500 MG PO TABS: 500.0000 mg | ORAL_TABLET | Freq: Two times a day (BID) | ORAL | 0 refills | Status: DC

## 2017-10-11 MED ORDER — HYDROCHLOROTHIAZIDE 12.5 MG PO CAPS: ORAL_CAPSULE | ORAL | 2 refills | Status: DC

## 2017-10-11 MED ORDER — DEXLANSOPRAZOLE 60 MG PO CPDR: 60.0000 mg | DELAYED_RELEASE_CAPSULE | Freq: Every day | ORAL | 11 refills | Status: DC

## 2017-10-11 NOTE — Progress Notes (Signed)
Name: Brenda [name]   MRN: [id number]    DOB: 05/27/1981   Date:10/11/2017       Progress Note  Subjective  Chief Complaint  Chief Complaint  Patient presents with  . Gastroesophageal Reflux  . Hypertension    has been out for a couple of days    Hypertension  This is a chronic problem. The current episode started more than 1 year ago. The problem is unchanged. The problem is controlled. Associated symptoms include headaches. Pertinent negatives include no anxiety, blurred vision, chest pain, malaise/fatigue, neck pain, orthopnea, palpitations, peripheral edema, PND, shortness of breath or sweats. There are no associated agents to hypertension. There are no known risk factors for coronary artery disease. Past treatments include diuretics. The current treatment provides moderate improvement. There are no compliance problems.  There is no history of angina, kidney disease, CAD/MI, CVA, heart failure, left ventricular hypertrophy, PVD or retinopathy. There is no history of chronic renal disease, a hypertension causing med or renovascular disease.  Gastroesophageal Reflux  She complains of heartburn and a sore throat. She reports no abdominal pain, no belching, no chest pain, no choking, no coughing, no dysphagia, no early satiety, no globus sensation, no hoarse voice, no nausea, no stridor, no tooth decay, no water brash or no wheezing. This is a chronic problem. The current episode started more than 1 year ago. The problem occurs frequently. The problem has been waxing and waning. Pertinent negatives include no melena or weight loss. She has tried a PPI (dexilant) for the symptoms. The treatment provided mild relief. Past procedures do not include an abdominal ultrasound, an EGD, esophageal manometry, esophageal pH monitoring, H. pylori antibody titer or a UGI.  Sinusitis  This is a new problem. The current episode started in the past 7 days. The problem has been waxing and waning since onset.  The maximum temperature recorded prior to her arrival was 100.4 - 100.9 F. The fever has been present for 1 to 2 days. The pain is mild. Associated symptoms include congestion, headaches, sinus pressure, sneezing and a sore throat. Pertinent negatives include no chills, coughing, diaphoresis, ear pain, hoarse voice, neck pain, shortness of breath or swollen glands. (Yellow green blood tinged) The treatment provided mild relief.    No problem-specific Assessment & Plan notes found for this encounter.   Past Medical History:  Diagnosis Date  . ADHD, adult residual type   . Anxiety   . Depression   . GERD (gastroesophageal reflux disease)   . Hypertension   . Wears contact lenses     Past Surgical History:  Procedure Laterality Date  . BREAST REDUCTION SURGERY    . COLONOSCOPY WITH PROPOFOL N/A 11/09/2016   Procedure: COLONOSCOPY WITH PROPOFOL;  Surgeon: [name] MiniumWohl, Darren, MD;  [facility];  Service: Endoscopy;  Laterality: N/A;  . ESOPHAGOGASTRODUODENOSCOPY (EGD) WITH PROPOFOL N/A 11/09/2016   Procedure: ESOPHAGOGASTRODUODENOSCOPY (EGD) WITH PROPOFOL;  Surgeon: [name] MiniumWohl, Darren, MD;  [facility];  Service: Endoscopy;  Laterality: N/A;  . TONSILLECTOMY      Family History  Problem Relation Age of Onset  . Heart disease Maternal Grandmother   . Heart disease Maternal Grandfather   . Diabetes Paternal Grandfather     Social History   Socioeconomic History  . Marital status: Married    Spouse name: Not on file  . Number of children: Not on file  . Years of education: Not on file  . Highest education level: Not on file  Occupational  History  . Not on file  Social Needs  . Financial resource strain: Not on file  . Food insecurity:    Worry: Not on file    Inability: Not on file  . Transportation needs:    Medical: Not on file    Non-medical: Not on file  Tobacco Use  . Smoking status: Never Smoker  . Smokeless tobacco: Never Used  Substance and  Sexual Activity  . Alcohol use: Yes    Alcohol/week: 6.0 oz    Types: 5 Glasses of wine, 5 Cans of beer per week  . Drug use: No  . Sexual activity: Yes    Birth control/protection: Pill  Lifestyle  . Physical activity:    Days per week: Not on file    Minutes per session: Not on file  . Stress: Not on file  Relationships  . Social connections:    Talks on phone: Not on file    Gets together: Not on file    Attends religious service: Not on file    Active member of club or organization: Not on file    Attends meetings of clubs or organizations: Not on file    Relationship status: Not on file  . Intimate partner violence:    Fear of current or ex partner: Not on file    Emotionally abused: Not on file    Physically abused: Not on file    Forced sexual activity: Not on file  Other Topics Concern  . Not on file  Social History Narrative  . Not on file    Allergies  Allergen Reactions  . Ace Inhibitors Swelling    Lips/ face    Outpatient Medications Prior to Visit  Medication Sig Dispense Refill  . ADDERALL XR 20 MG 24 hr capsule Take 1 capsule by mouth daily. Dr Evelene Croon  0  . ALPRAZolam (XANAX) 1 MG tablet Take 1 mg by mouth at bedtime as needed for anxiety.    . traZODone (DESYREL) 50 MG tablet Take 50 mg by mouth at bedtime. 1-3 tabs at bedtime Dr Evelene Croon    . dexlansoprazole (DEXILANT) 60 MG capsule Take 1 capsule (60 mg total) by mouth daily. 30 capsule 5  . hydrochlorothiazide (MICROZIDE) 12.5 MG capsule TAKE ONE (1) CAPSULE EACH DAY. 90 capsule 0  . norethindrone (MICRONOR,CAMILA,ERRIN) 0.35 MG tablet Take 1 tablet (0.35 mg total) by mouth daily. (Patient not taking: Reported on 10/11/2017) 1 Package 11  . amoxicillin (AMOXIL) 500 MG capsule Take 1 capsule (500 mg total) by mouth 3 (three) times daily. 30 capsule 0  . cefUROXime (CEFTIN) 500 MG tablet Take 1 tablet (500 mg total) by mouth 2 (two) times daily with a meal. 16 tablet 0   No facility-administered medications  prior to visit.     Review of Systems  Constitutional: Negative for chills, diaphoresis, fever, malaise/fatigue and weight loss.  HENT: Positive for congestion, sinus pressure, sneezing and sore throat. Negative for ear discharge, ear pain and hoarse voice.   Eyes: Negative for blurred vision.  Respiratory: Negative for cough, sputum production, choking, shortness of breath and wheezing.   Cardiovascular: Negative for chest pain, palpitations, orthopnea, leg swelling and PND.  Gastrointestinal: Positive for heartburn. Negative for abdominal pain, blood in stool, constipation, diarrhea, dysphagia, melena and nausea.  Genitourinary: Negative for dysuria, frequency, hematuria and urgency.  Musculoskeletal: Negative for back pain, joint pain, myalgias and neck pain.  Skin: Negative for rash.  Neurological: Positive for headaches. Negative for dizziness, tingling,  sensory change and focal weakness.  Endo/Heme/Allergies: Negative for environmental allergies and polydipsia. Does not bruise/bleed easily.  Psychiatric/Behavioral: Negative for depression and suicidal ideas. The patient is not nervous/anxious and does not have insomnia.      Objective  Vitals:   10/11/17 1400  BP: (!) 160/100  Pulse: 100  Weight: 230 lb (104.3 kg)  Height: 5\' 4"  (1.626 m)    Physical Exam  Constitutional: She is oriented to person, place, and time. She appears well-developed and well-nourished.  HENT:  Head: Normocephalic.  Right Ear: External ear normal.  Left Ear: External ear normal.  Mouth/Throat: Oropharynx is clear and moist.  Eyes: Pupils are equal, round, and reactive to light. Conjunctivae and EOM are normal. Lids are everted and swept, no foreign bodies found. Left eye exhibits no hordeolum. No foreign body present in the left eye. Right conjunctiva is not injected. Left conjunctiva is not injected. No scleral icterus.  Neck: Normal range of motion. Neck supple. No JVD present. No tracheal  deviation present. No thyromegaly present.  Cardiovascular: Normal rate, regular rhythm, normal heart sounds and intact distal pulses. Exam reveals no gallop and no friction rub.  No murmur heard. Pulmonary/Chest: Effort normal and breath sounds normal. No respiratory distress. She has no wheezes. She has no rales.  Abdominal: Soft. Bowel sounds are normal. She exhibits no mass. There is no hepatosplenomegaly. There is no tenderness. There is no rebound and no guarding.  Musculoskeletal: Normal range of motion. She exhibits no edema or tenderness.  Lymphadenopathy:    She has no cervical adenopathy.  Neurological: She is alert and oriented to person, place, and time. She has normal strength. She displays normal reflexes. No cranial nerve deficit.  Skin: Skin is warm. No rash noted.  Psychiatric: She has a normal mood and affect. Her mood appears not anxious. She does not exhibit a depressed mood.  Nursing note and vitals reviewed.     Assessment & Plan  Problem List Items Addressed This Visit    None    Visit Diagnoses    Gastroesophageal reflux disease, esophagitis presence not specified    -  Primary   Relevant Medications   dexlansoprazole (DEXILANT) 60 MG capsule   Acute maxillary sinusitis, recurrence not specified       Relevant Medications   cefUROXime (CEFTIN) 500 MG tablet   Dehydration       Relevant Medications   cefUROXime (CEFTIN) 500 MG tablet   Essential hypertension       Relevant Medications   hydrochlorothiazide (MICROZIDE) 12.5 MG capsule      Meds ordered this encounter  Medications  . hydrochlorothiazide (MICROZIDE) 12.5 MG capsule    Sig: TAKE ONE (1) CAPSULE EACH DAY.    Dispense:  90 capsule    Refill:  2  . dexlansoprazole (DEXILANT) 60 MG capsule    Sig: Take 1 capsule (60 mg total) by mouth daily.    Dispense:  30 capsule    Refill:  11  . cefUROXime (CEFTIN) 500 MG tablet    Sig: Take 1 tablet (500 mg total) by mouth 2 (two) times daily with  a meal.    Dispense:  16 tablet    Refill:  0      Dr. Hayden [facility]  10/11/17

## 2017-11-09 ENCOUNTER — Other Ambulatory Visit: Payer: Self-pay

## 2017-11-09 MED ORDER — PROPRANOLOL HCL 10 MG PO TABS: 10.0000 mg | ORAL_TABLET | Freq: Every day | ORAL | 0 refills | Status: DC

## 2017-11-09 NOTE — Progress Notes (Unsigned)
Sent in propranolol

## 2017-11-26 ENCOUNTER — Other Ambulatory Visit: Payer: Self-pay | Admitting: Family Medicine

## 2017-12-23 ENCOUNTER — Other Ambulatory Visit: Payer: Self-pay | Admitting: Family Medicine

## 2018-01-14 ENCOUNTER — Other Ambulatory Visit: Payer: Self-pay | Admitting: Family Medicine

## 2018-01-31 ENCOUNTER — Other Ambulatory Visit: Payer: Self-pay | Admitting: Family Medicine

## 2018-02-17 ENCOUNTER — Other Ambulatory Visit: Payer: Self-pay | Admitting: Family Medicine

## 2018-02-18 ENCOUNTER — Ambulatory Visit: Payer: 59 | Admitting: Family Medicine

## 2018-02-18 ENCOUNTER — Encounter: Payer: Self-pay | Admitting: Family Medicine

## 2018-02-18 VITALS — BP 132/100 | HR 80 | Ht 64.0 in | Wt 220.0 lb

## 2018-02-18 DIAGNOSIS — I10 Essential (primary) hypertension: Secondary | ICD-10-CM | POA: Diagnosis not present

## 2018-02-18 DIAGNOSIS — M5114 Intervertebral disc disorders with radiculopathy, thoracic region: Secondary | ICD-10-CM | POA: Diagnosis not present

## 2018-02-18 MED ORDER — MELOXICAM 15 MG PO TABS: 15.0000 mg | ORAL_TABLET | Freq: Every day | ORAL | 0 refills | Status: DC

## 2018-02-18 MED ORDER — TRAMADOL HCL 50 MG PO TABS: 50.0000 mg | ORAL_TABLET | Freq: Four times a day (QID) | ORAL | 0 refills | Status: AC | PRN

## 2018-02-18 MED ORDER — HYDROCHLOROTHIAZIDE 25 MG PO TABS: 25.0000 mg | ORAL_TABLET | Freq: Every day | ORAL | 3 refills | Status: DC

## 2018-02-18 NOTE — Progress Notes (Signed)
Name: Brenda [name]   MRN: [id number]    DOB: 04/30/1981   Date:02/18/2018       Progress Note  Subjective  Chief Complaint  Chief Complaint  Patient presents with  . Back Pain    radiates around L) flank to the front. thinks it is muscular because when she reaches, it really hurts    Back Pain  This is a new problem. The current episode started 1 to 4 weeks ago (7-8 days ago). The problem occurs constantly. The problem has been waxing and waning since onset. The pain is present in the lumbar spine. The quality of the pain is described as aching. Radiates to: anteriorly. The pain is at a severity of 7/10. The pain is moderate. The pain is worse during the day. The symptoms are aggravated by bending and twisting. Pertinent negatives include no abdominal pain, bladder incontinence, bowel incontinence, chest pain, dysuria, fever, headaches, numbness, paresthesias, tingling, weakness or weight loss. She has tried NSAIDs for the symptoms. The treatment provided moderate relief.  Hypertension  This is a chronic problem. The current episode started 1 to 4 weeks ago. The problem has been waxing and waning since onset. The problem is uncontrolled. Pertinent negatives include no blurred vision, chest pain, headaches, malaise/fatigue, neck pain, palpitations or shortness of breath. (Heard a pop)    No problem-specific Assessment & Plan notes found for this encounter.   Past Medical History:  Diagnosis Date  . ADHD, adult residual type   . Anxiety   . Depression   . GERD (gastroesophageal reflux disease)   . Hypertension   . Wears contact lenses     Past Surgical History:  Procedure Laterality Date  . BREAST REDUCTION SURGERY    . COLONOSCOPY WITH PROPOFOL N/A 11/09/2016   Procedure: COLONOSCOPY WITH PROPOFOL;  Surgeon: [name] Minium, MD;  Location: [facility];  Service: Endoscopy;  Laterality: N/A;  . ESOPHAGOGASTRODUODENOSCOPY (EGD) WITH PROPOFOL N/A 11/09/2016   Procedure:  ESOPHAGOGASTRODUODENOSCOPY (EGD) WITH PROPOFOL;  Surgeon: [name] Minium, MD;  Location: [facility];  Service: Endoscopy;  Laterality: N/A;  . TONSILLECTOMY      Family History  Problem Relation Age of Onset  . Heart disease Maternal Grandmother   . Heart disease Maternal Grandfather   . Diabetes Paternal Grandfather     Social History   Socioeconomic History  . Marital status: Married    Spouse name: Not on file  . Number of children: Not on file  . Years of education: Not on file  . Highest education level: Not on file  Occupational History  . Not on file  Social Needs  . Financial resource strain: Not on file  . Food insecurity:    Worry: Not on file    Inability: Not on file  . Transportation needs:    Medical: Not on file    Non-medical: Not on file  Tobacco Use  . Smoking status: Never Smoker  . Smokeless tobacco: Never Used  Substance and Sexual Activity  . Alcohol use: Yes    Alcohol/week: 10.0 standard drinks    Types: 5 Glasses of wine, 5 Cans of beer per week  . Drug use: No  . Sexual activity: Yes    Birth control/protection: Pill  Lifestyle  . Physical activity:    Days per week: Not on file    Minutes per session: Not on file  . Stress: Not on file  Relationships  . Social connections:    Talks on phone:  Not on file    Gets together: Not on file    Attends religious service: Not on file    Active member of club or organization: Not on file    Attends meetings of clubs or organizations: Not on file    Relationship status: Not on file  . Intimate partner violence:    Fear of current or ex partner: Not on file    Emotionally abused: Not on file    Physically abused: Not on file    Forced sexual activity: Not on file  Other Topics Concern  . Not on file  Social History Narrative  . Not on file    Allergies  Allergen Reactions  . Ace Inhibitors Swelling    Lips/ face    Outpatient Medications Prior to Visit  Medication Sig Dispense  Refill  . ADDERALL XR 20 MG 24 hr capsule Take 1 capsule by mouth daily. Dr Evelene CroonKaur  0  . ALPRAZolam (XANAX) 1 MG tablet Take 1 mg by mouth at bedtime as needed for anxiety.    Marland Kitchen. dexlansoprazole (DEXILANT) 60 MG capsule Take 1 capsule (60 mg total) by mouth daily. 30 capsule 11  . propranolol (INDERAL) 10 MG tablet TAKE 1 TABLET BY MOUTH DAILY AS NEEDED FOR PRESENTATION 20 tablet 0  . traZODone (DESYREL) 50 MG tablet Take 50 mg by mouth at bedtime. 1-3 tabs at bedtime Dr Evelene CroonKaur    . hydrochlorothiazide (MICROZIDE) 12.5 MG capsule TAKE ONE (1) CAPSULE EACH DAY. 90 capsule 2  . cefUROXime (CEFTIN) 500 MG tablet Take 1 tablet (500 mg total) by mouth 2 (two) times daily with a meal. 16 tablet 0  . norethindrone (MICRONOR,CAMILA,ERRIN) 0.35 MG tablet Take 1 tablet (0.35 mg total) by mouth daily. (Patient not taking: Reported on 10/11/2017) 1 Package 11   No facility-administered medications prior to visit.     Review of Systems  Constitutional: Negative for chills, fever, malaise/fatigue and weight loss.  HENT: Negative for ear discharge, ear pain and sore throat.   Eyes: Negative for blurred vision.  Respiratory: Negative for cough, sputum production, shortness of breath and wheezing.   Cardiovascular: Negative for chest pain, palpitations and leg swelling.  Gastrointestinal: Negative for abdominal pain, blood in stool, bowel incontinence, constipation, diarrhea, heartburn, melena and nausea.  Genitourinary: Negative for bladder incontinence, dysuria, frequency, hematuria and urgency.  Musculoskeletal: Positive for back pain. Negative for joint pain, myalgias and neck pain.  Skin: Negative for rash.  Neurological: Negative for dizziness, tingling, sensory change, focal weakness, weakness, numbness, headaches and paresthesias.  Endo/Heme/Allergies: Negative for environmental allergies and polydipsia. Does not bruise/bleed easily.  Psychiatric/Behavioral: Negative for depression and suicidal ideas. The  patient is not nervous/anxious and does not have insomnia.      Objective  Vitals:   02/18/18 1333  BP: (!) 132/100  Pulse: 80  Weight: 220 lb (99.8 kg)  Height: 5\' 4"  (1.626 m)    Physical Exam  Constitutional: No distress.  HENT:  Head: Normocephalic and atraumatic.  Right Ear: External ear normal.  Left Ear: External ear normal.  Nose: Nose normal.  Mouth/Throat: Oropharynx is clear and moist.  Eyes: Pupils are equal, round, and reactive to light. Conjunctivae and EOM are normal. Right eye exhibits no discharge. Left eye exhibits no discharge.  Neck: Normal range of motion. Neck supple. No JVD present. No thyromegaly present.  Cardiovascular: Normal rate, regular rhythm, normal heart sounds and intact distal pulses. Exam reveals no gallop and no friction rub.  No  murmur heard. Pulmonary/Chest: Effort normal and breath sounds normal. She has no wheezes. She exhibits no tenderness and no bony tenderness.  Abdominal: Soft. Bowel sounds are normal. She exhibits no mass. There is no tenderness. There is no guarding.  Musculoskeletal: Normal range of motion. She exhibits no edema.       Thoracic back: She exhibits no tenderness, no pain and no spasm.  Lymphadenopathy:    She has no cervical adenopathy.  Neurological: She is alert. She has normal reflexes.  Skin: Skin is warm and dry. She is not diaphoretic.  Nursing note and vitals reviewed.     Assessment & Plan  Problem List Items Addressed This Visit    None    Visit Diagnoses    Intervertebral disc disorder with radiculopathy of thoracic region    -  Primary   gave tramadol 50mg  and meloxicam 15mg     Relevant Medications   traMADol (ULTRAM) 50 MG tablet   meloxicam (MOBIC) 15 MG tablet   Essential hypertension       increased HCTZ to 25mg - recheck B/P in 4 weeks   Relevant Medications   hydrochlorothiazide (HYDRODIURIL) 25 MG tablet      Meds ordered this encounter  Medications  . traMADol (ULTRAM) 50 MG  tablet    Sig: Take 1 tablet (50 mg total) by mouth every 6 (six) hours as needed for up to 5 days.    Dispense:  20 tablet    Refill:  0  . meloxicam (MOBIC) 15 MG tablet    Sig: Take 1 tablet (15 mg total) by mouth daily.    Dispense:  30 tablet    Refill:  0  . hydrochlorothiazide (HYDRODIURIL) 25 MG tablet    Sig: Take 1 tablet (25 mg total) by mouth daily.    Dispense:  90 tablet    Refill:  3      Dr. Elizabeth Sauer [facility]  02/18/18

## 2018-02-28 ENCOUNTER — Ambulatory Visit: Payer: 59 | Admitting: Family Medicine

## 2018-02-28 ENCOUNTER — Encounter: Payer: Self-pay | Admitting: Family Medicine

## 2018-02-28 VITALS — BP 120/92 | HR 80 | Temp 98.1°F | Ht 64.0 in | Wt 212.0 lb

## 2018-02-28 DIAGNOSIS — J0100 Acute maxillary sinusitis, unspecified: Secondary | ICD-10-CM

## 2018-02-28 MED ORDER — AZITHROMYCIN 250 MG PO TABS: ORAL_TABLET | ORAL | 0 refills | Status: DC

## 2018-02-28 NOTE — Progress Notes (Signed)
Name: Brenda [name]   MRN: [id number]    DOB: Jul 11, 1980   Date:02/28/2018       Progress Note  Subjective  Chief Complaint  Chief Complaint  Patient presents with  . Sinusitis    dk yellow/ bloody production. Drainage/ sore throat- sleeping with mouth open at night    Sinusitis  This is a new problem. The current episode started in the past 7 days. The problem has been gradually worsening since onset. The maximum temperature recorded prior to her arrival was 100.4 - 100.9 F. The fever has been present for less than 1 day. Her pain is at a severity of 4/10. The pain is moderate. Pertinent negatives include no chills, congestion, coughing, diaphoresis, ear pain, headaches, hoarse voice, neck pain, shortness of breath, sinus pressure, sneezing, sore throat or swollen glands. Past treatments include nothing.    No problem-specific Assessment & Plan notes found for this encounter.   Past Medical History:  Diagnosis Date  . ADHD, adult residual type   . Anxiety   . Depression   . GERD (gastroesophageal reflux disease)   . Hypertension   . Wears contact lenses     Past Surgical History:  Procedure Laterality Date  . BREAST REDUCTION SURGERY    . COLONOSCOPY WITH PROPOFOL N/A 11/09/2016   Procedure: COLONOSCOPY WITH PROPOFOL;  Surgeon: [name] Minium, MD;  Location: [facility];  Service: Endoscopy;  Laterality: N/A;  . ESOPHAGOGASTRODUODENOSCOPY (EGD) WITH PROPOFOL N/A 11/09/2016   Procedure: ESOPHAGOGASTRODUODENOSCOPY (EGD) WITH PROPOFOL;  Surgeon: [name] Minium, MD;  Location: [facility];  Service: Endoscopy;  Laterality: N/A;  . TONSILLECTOMY      Family History  Problem Relation Age of Onset  . Heart disease Maternal Grandmother   . Heart disease Maternal Grandfather   . Diabetes Paternal Grandfather     Social History   Socioeconomic History  . Marital status: Married    Spouse name: Not on file  . Number of children: Not on file  . Years of  education: Not on file  . Highest education level: Not on file  Occupational History  . Not on file  Social Needs  . Financial resource strain: Not on file  . Food insecurity:    Worry: Not on file    Inability: Not on file  . Transportation needs:    Medical: Not on file    Non-medical: Not on file  Tobacco Use  . Smoking status: Never Smoker  . Smokeless tobacco: Never Used  Substance and Sexual Activity  . Alcohol use: Yes    Alcohol/week: 10.0 standard drinks    Types: 5 Glasses of wine, 5 Cans of beer per week  . Drug use: No  . Sexual activity: Yes    Birth control/protection: Pill  Lifestyle  . Physical activity:    Days per week: Not on file    Minutes per session: Not on file  . Stress: Not on file  Relationships  . Social connections:    Talks on phone: Not on file    Gets together: Not on file    Attends religious service: Not on file    Active member of club or organization: Not on file    Attends meetings of clubs or organizations: Not on file    Relationship status: Not on file  . Intimate partner violence:    Fear of current or ex partner: Not on file    Emotionally abused: Not on file    Physically  abused: Not on file    Forced sexual activity: Not on file  Other Topics Concern  . Not on file  Social History Narrative  . Not on file    Allergies  Allergen Reactions  . Ace Inhibitors Swelling    Lips/ face    Outpatient Medications Prior to Visit  Medication Sig Dispense Refill  . ADDERALL XR 20 MG 24 hr capsule Take 1 capsule by mouth daily. Dr Evelene Croon  0  . ALPRAZolam (XANAX) 1 MG tablet Take 1 mg by mouth at bedtime as needed for anxiety.    Marland Kitchen dexlansoprazole (DEXILANT) 60 MG capsule Take 1 capsule (60 mg total) by mouth daily. 30 capsule 11  . hydrochlorothiazide (HYDRODIURIL) 25 MG tablet Take 1 tablet (25 mg total) by mouth daily. 90 tablet 3  . meloxicam (MOBIC) 15 MG tablet Take 1 tablet (15 mg total) by mouth daily. 30 tablet 0  .  propranolol (INDERAL) 10 MG tablet TAKE 1 TABLET BY MOUTH DAILY AS NEEDED FOR PRESENTATION 20 tablet 0  . traZODone (DESYREL) 50 MG tablet Take 50 mg by mouth at bedtime. 1-3 tabs at bedtime Dr Evelene Croon     No facility-administered medications prior to visit.     Review of Systems  Constitutional: Negative for chills, diaphoresis, fever, malaise/fatigue and weight loss.  HENT: Negative for congestion, ear discharge, ear pain, hoarse voice, sinus pressure, sneezing and sore throat.   Eyes: Negative for blurred vision.  Respiratory: Negative for cough, sputum production, shortness of breath and wheezing.   Cardiovascular: Negative for chest pain, palpitations and leg swelling.  Gastrointestinal: Negative for abdominal pain, blood in stool, constipation, diarrhea, heartburn, melena and nausea.  Genitourinary: Negative for dysuria, frequency, hematuria and urgency.  Musculoskeletal: Negative for back pain, joint pain, myalgias and neck pain.  Skin: Negative for rash.  Neurological: Negative for dizziness, tingling, sensory change, focal weakness and headaches.  Endo/Heme/Allergies: Negative for environmental allergies and polydipsia. Does not bruise/bleed easily.  Psychiatric/Behavioral: Negative for depression and suicidal ideas. The patient is not nervous/anxious and does not have insomnia.      Objective  Vitals:   02/28/18 1400  BP: (!) 120/92  Pulse: 80  Temp: 98.1 F (36.7 C)  TempSrc: Oral  Weight: 212 lb (96.2 kg)  Height: 5\' 4"  (1.626 m)    Physical Exam  Constitutional: She is oriented to person, place, and time. She appears well-developed and well-nourished.  HENT:  Head: Normocephalic.  Right Ear: External ear normal.  Left Ear: External ear normal.  Mouth/Throat: Oropharynx is clear and moist.  Eyes: Pupils are equal, round, and reactive to light. Conjunctivae and EOM are normal. Lids are everted and swept, no foreign bodies found. Left eye exhibits no hordeolum. No  foreign body present in the left eye. Right conjunctiva is not injected. Left conjunctiva is not injected. No scleral icterus.  Neck: Normal range of motion. Neck supple. No JVD present. No tracheal deviation present. No thyromegaly present.  Cardiovascular: Normal rate, regular rhythm, normal heart sounds and intact distal pulses. Exam reveals no gallop and no friction rub.  No murmur heard. Pulmonary/Chest: Effort normal and breath sounds normal. No respiratory distress. She has no wheezes. She has no rales.  Abdominal: Soft. Bowel sounds are normal. She exhibits no mass. There is no hepatosplenomegaly. There is no tenderness. There is no rebound and no guarding.  Musculoskeletal: Normal range of motion. She exhibits no edema or tenderness.  Lymphadenopathy:    She has no cervical adenopathy.  Neurological: She is alert and oriented to person, place, and time. She has normal strength. She displays normal reflexes. No cranial nerve deficit.  Skin: Skin is warm. No rash noted.  Psychiatric: She has a normal mood and affect. Her mood appears not anxious. She does not exhibit a depressed mood.  Nursing note and vitals reviewed.     Assessment & Plan  Problem List Items Addressed This Visit    None    Visit Diagnoses    Acute maxillary sinusitis, recurrence not specified    -  Primary   start ZPack as directed   Relevant Medications   azithromycin (ZITHROMAX) 250 MG tablet      Meds ordered this encounter  Medications  . azithromycin (ZITHROMAX) 250 MG tablet    Sig: 2 today then 1 a day for 4 days    Dispense:  6 tablet    Refill:  0      Dr. Hayden [facility]  02/28/18

## 2018-03-05 ENCOUNTER — Other Ambulatory Visit: Payer: Self-pay

## 2018-03-05 MED ORDER — PANTOPRAZOLE SODIUM 40 MG PO TBEC: 40.0000 mg | DELAYED_RELEASE_TABLET | Freq: Every day | ORAL | 3 refills | Status: DC

## 2018-03-09 ENCOUNTER — Emergency Department: Payer: 59

## 2018-03-09 ENCOUNTER — Observation Stay
Admission: EM | Admit: 2018-03-09 | Discharge: 2018-03-10 | Disposition: A | Discharge status: Home / Self Care | Payer: 59 | Attending: Internal Medicine | Admitting: Internal Medicine

## 2018-03-09 ENCOUNTER — Other Ambulatory Visit: Payer: Self-pay

## 2018-03-09 DIAGNOSIS — R1013 Epigastric pain: Secondary | ICD-10-CM | POA: Diagnosis present

## 2018-03-09 DIAGNOSIS — I10 Essential (primary) hypertension: Secondary | ICD-10-CM | POA: Insufficient documentation

## 2018-03-09 DIAGNOSIS — K219 Gastro-esophageal reflux disease without esophagitis: Secondary | ICD-10-CM | POA: Insufficient documentation

## 2018-03-09 DIAGNOSIS — F909 Attention-deficit hyperactivity disorder, unspecified type: Secondary | ICD-10-CM | POA: Insufficient documentation

## 2018-03-09 DIAGNOSIS — F419 Anxiety disorder, unspecified: Secondary | ICD-10-CM | POA: Diagnosis not present

## 2018-03-09 DIAGNOSIS — K852 Alcohol induced acute pancreatitis without necrosis or infection: Principal | ICD-10-CM | POA: Insufficient documentation

## 2018-03-09 DIAGNOSIS — Z79899 Other long term (current) drug therapy: Secondary | ICD-10-CM | POA: Insufficient documentation

## 2018-03-09 DIAGNOSIS — R109 Unspecified abdominal pain: Secondary | ICD-10-CM

## 2018-03-09 DIAGNOSIS — F329 Major depressive disorder, single episode, unspecified: Secondary | ICD-10-CM | POA: Insufficient documentation

## 2018-03-09 LAB — HEPATIC FUNCTION PANEL
ALK PHOS: 52 U/L (ref 38–126)
ALT: 43 U/L (ref 0–44)
AST: 37 U/L (ref 15–41)
Albumin: 3.7 g/dL (ref 3.5–5.0)
BILIRUBIN DIRECT: 0.1 mg/dL (ref 0.0–0.2)
BILIRUBIN TOTAL: 0.3 mg/dL (ref 0.3–1.2)
Indirect Bilirubin: 0.2 mg/dL — ABNORMAL LOW (ref 0.3–0.9)
Total Protein: 7.1 g/dL (ref 6.5–8.1)

## 2018-03-09 LAB — CBC
HCT: 40.5 % (ref 35.0–47.0)
HEMOGLOBIN: 14.1 g/dL (ref 12.0–16.0)
MCH: 31.2 pg (ref 26.0–34.0)
MCHC: 34.9 g/dL (ref 32.0–36.0)
MCV: 89.5 fL (ref 80.0–100.0)
PLATELETS: 342 10*3/uL (ref 150–440)
RBC: 4.53 MIL/uL (ref 3.80–5.20)
RDW: 12.5 % (ref 11.5–14.5)
WBC: 8.3 10*3/uL (ref 3.6–11.0)

## 2018-03-09 LAB — BASIC METABOLIC PANEL
Anion gap: 8 (ref 5–15)
BUN: 10 mg/dL (ref 6–20)
CALCIUM: 9.1 mg/dL (ref 8.9–10.3)
CHLORIDE: 106 mmol/L (ref 98–111)
CO2: 24 mmol/L (ref 22–32)
CREATININE: 0.62 mg/dL (ref 0.44–1.00)
GFR calc Af Amer: >60 mL/min (ref >60)
GFR calc non Af Amer: >60 mL/min (ref >60)
GLUCOSE: 114 mg/dL — AB (ref 70–99)
Potassium: 3.5 mmol/L (ref 3.5–5.1)
Sodium: 138 mmol/L (ref 135–145)

## 2018-03-09 LAB — TROPONIN I

## 2018-03-09 LAB — POCT PREGNANCY, URINE: Preg Test, Ur: NEGATIVE

## 2018-03-09 LAB — LIPASE, BLOOD: Lipase: 67 U/L — ABNORMAL HIGH (ref 11–51)

## 2018-03-09 IMAGING — US US ABDOMEN LIMITED
1 series · 14 of 25 positions shown · non-contrast
Comparison: None.

CLINICAL DATA: Abdominal pain radiating to the back.

EXAM:
ULTRASOUND ABDOMEN LIMITED RIGHT UPPER QUADRANT

[Series 1: us abdomen limited · 14 of 45 slices shown]
[im 1/45]
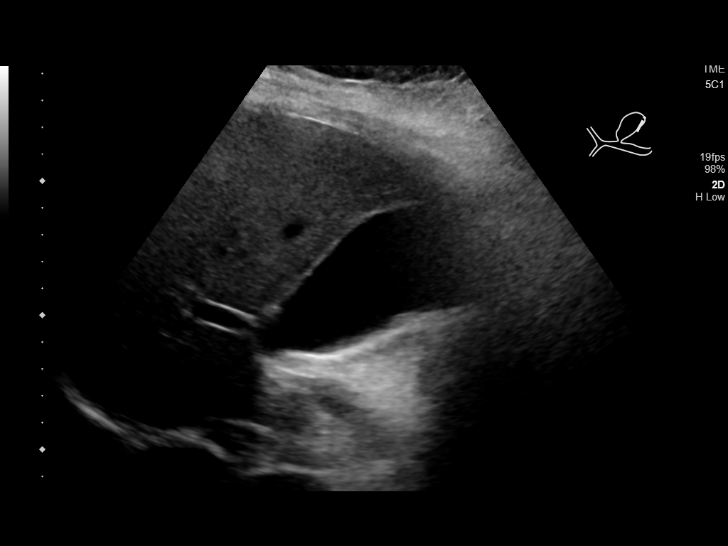
[im 4/45]
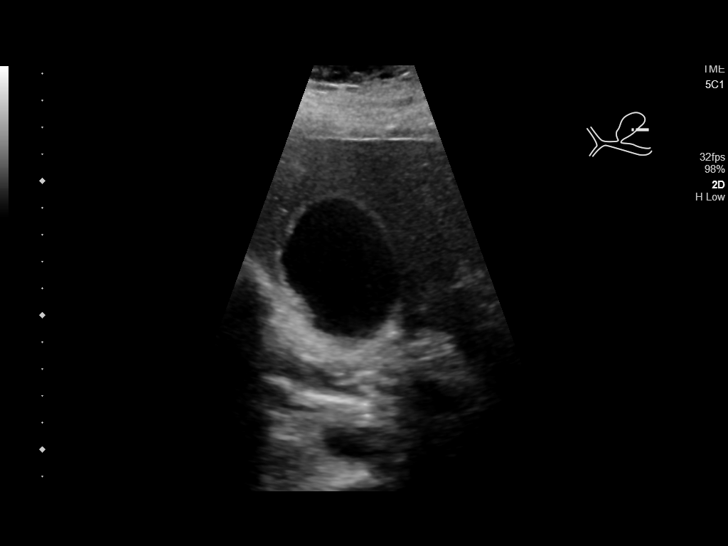
[im 8/45]
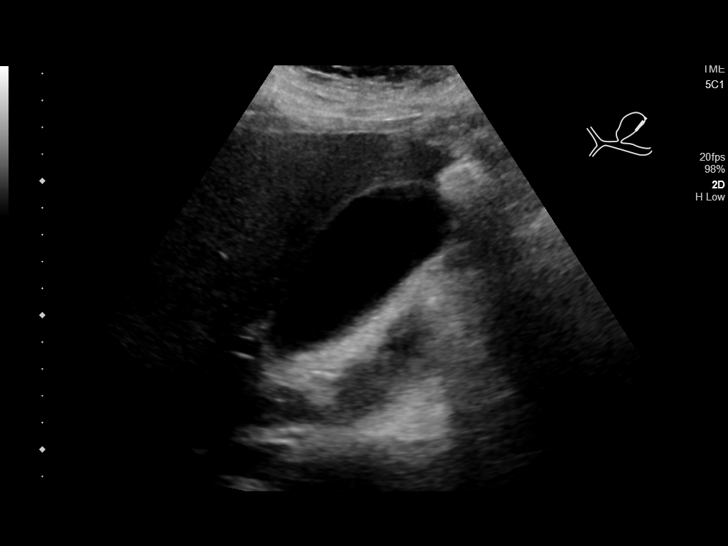
[im 12/45]
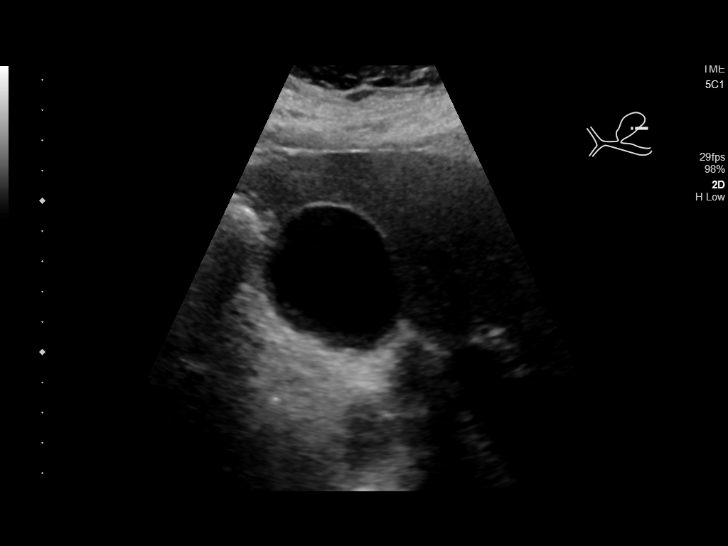
[im 15/45]
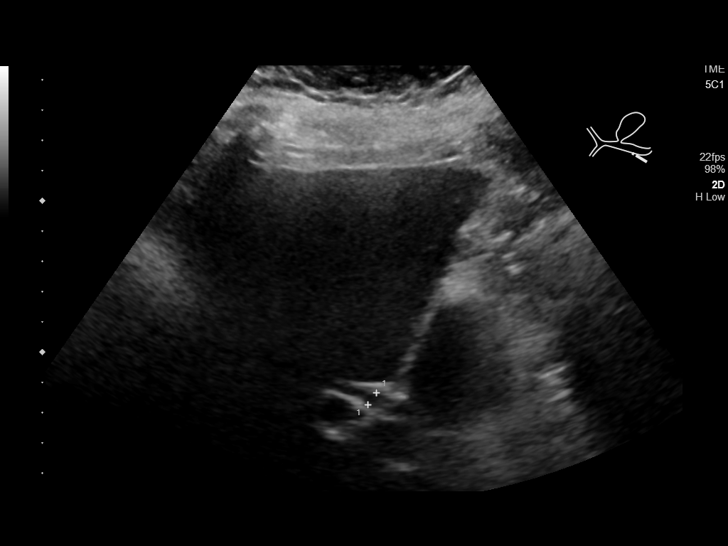
[im 17/45]
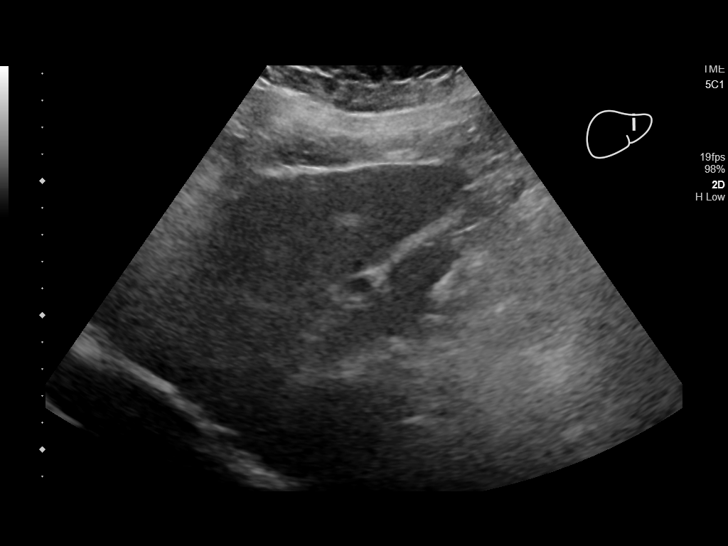
[im 21/45]
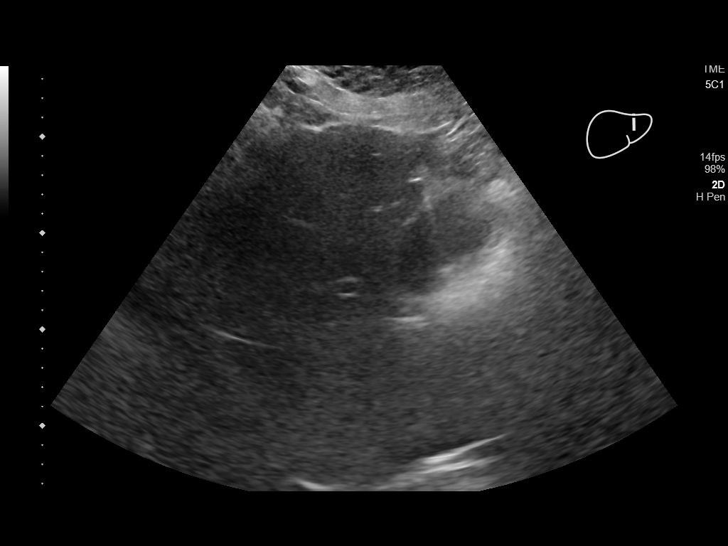
[im 24/45]
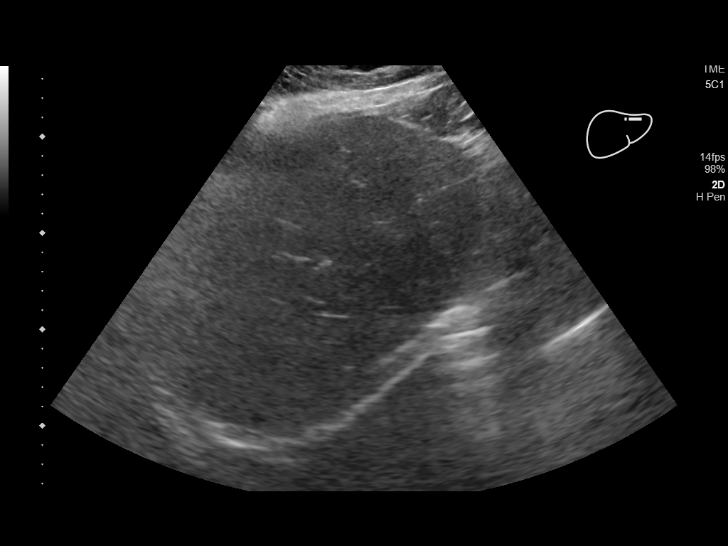
[im 28/45]
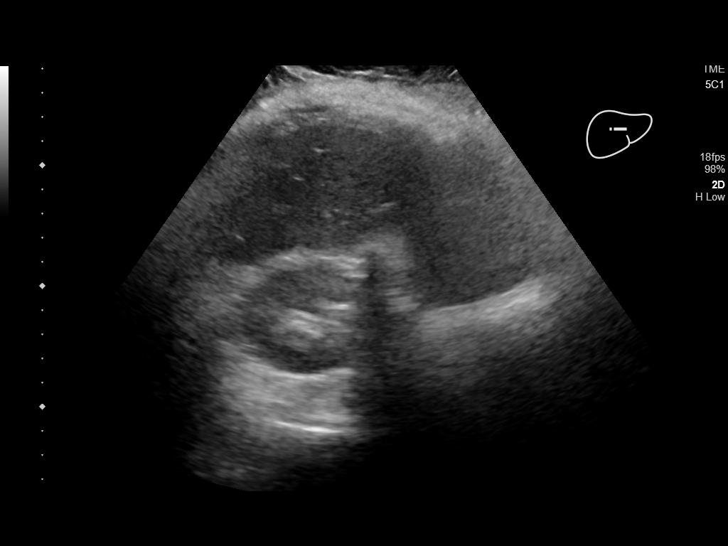
[im 30/45]
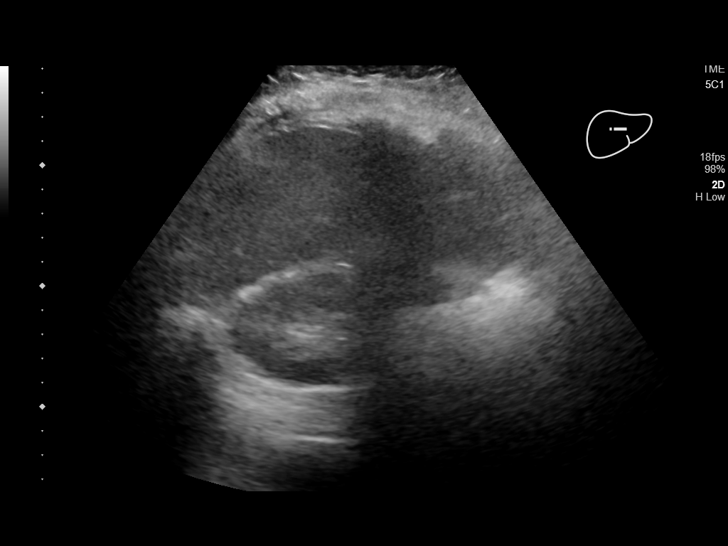
[im 34/45]
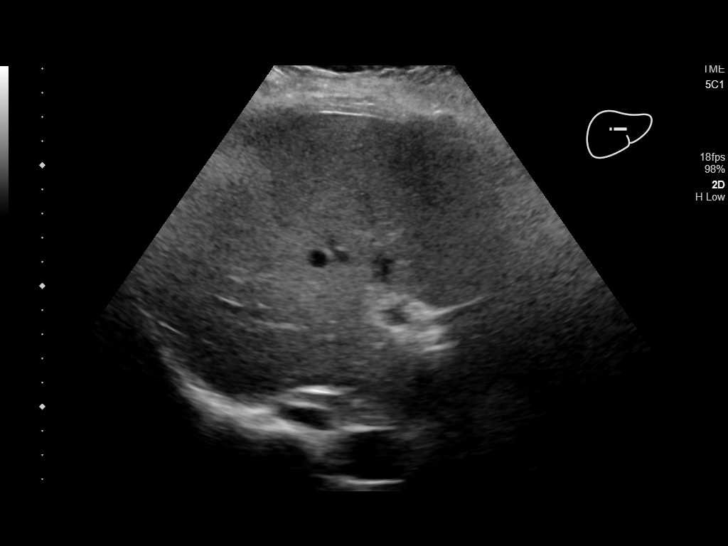
[im 37/45]
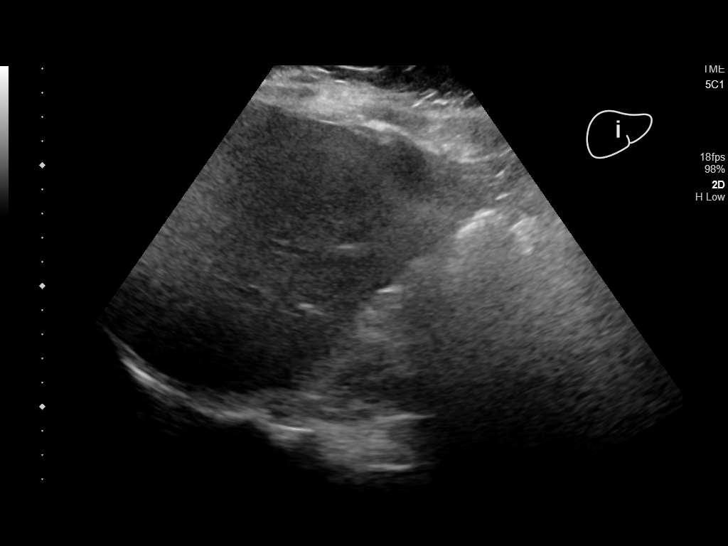
[im 41/45]
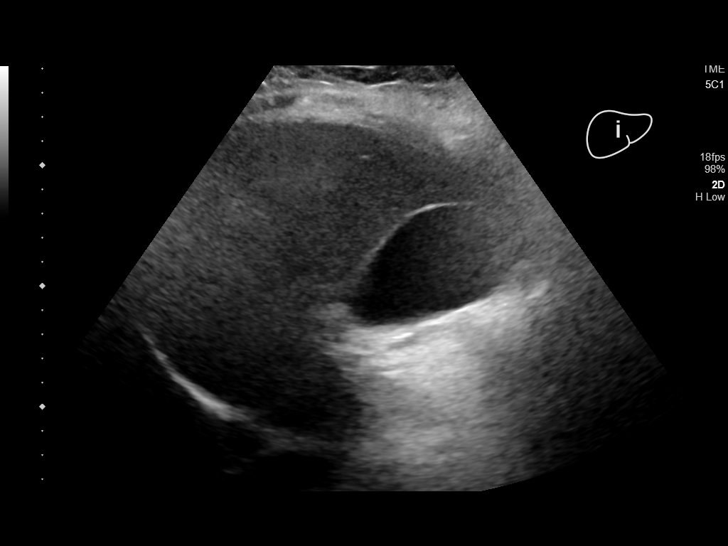
[im 45/45]
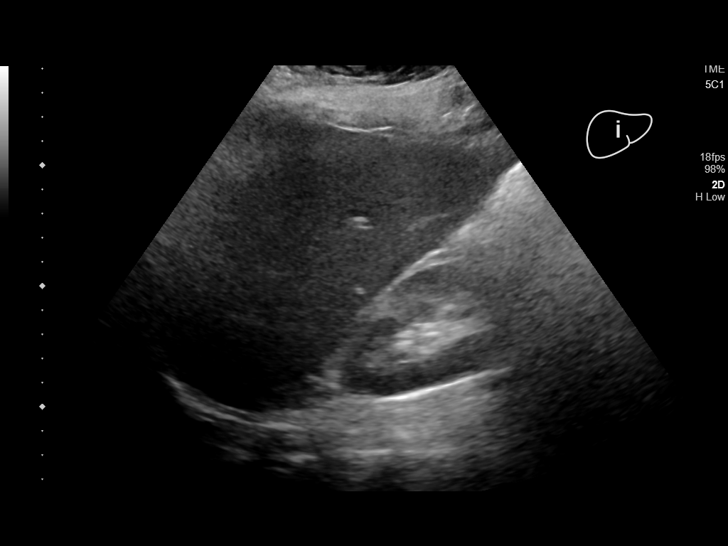

[14 of 25 positions shown; findings below may reference images not displayed]

FINDINGS: Gallbladder:

Physiologically distended. No gallstones or wall thickening
visualized. No pericholecystic fluid. No sonographic Murphy sign
noted by sonographer.

Common bile duct:

Diameter: 5 mm.

Liver:

No focal lesion identified. Within normal limits in parenchymal
echogenicity. Portal vein is patent on color Doppler imaging with
normal direction of blood flow towards the liver.
IMPRESSION: Normal right upper quadrant ultrasound.

## 2018-03-09 IMAGING — CT CT RENAL STONE PROTOCOL
2 of 4 series · 16 of 46 positions shown, 18 images · non-contrast
Comparison: [DATE] right upper quadrant ultrasound.

CLINICAL DATA: Right upper quadrant pain. Nausea and vomiting since
5 p.m..

EXAM:
CT ABDOMEN AND PELVIS WITHOUT CONTRAST
TECHNIQUE: Multidetector CT imaging of the abdomen and pelvis was performed
following the standard protocol without IV contrast.

[Series 2: stone full standard · axial · 0.68mm/px · z∈[-472,-37]mm · 13 of 97 slices shown, 15 images]
[im 5/97  soft-tissue]
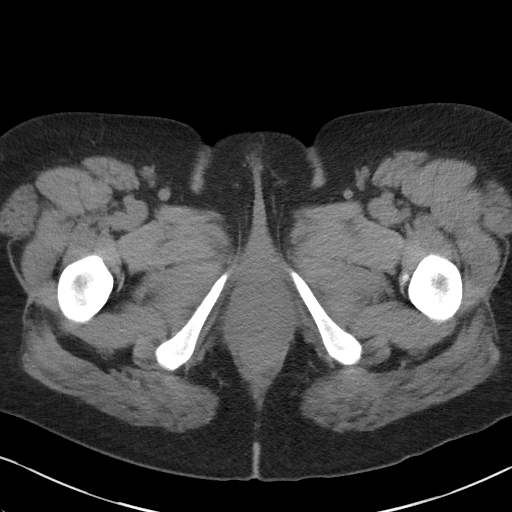
[im 5/97  bone]
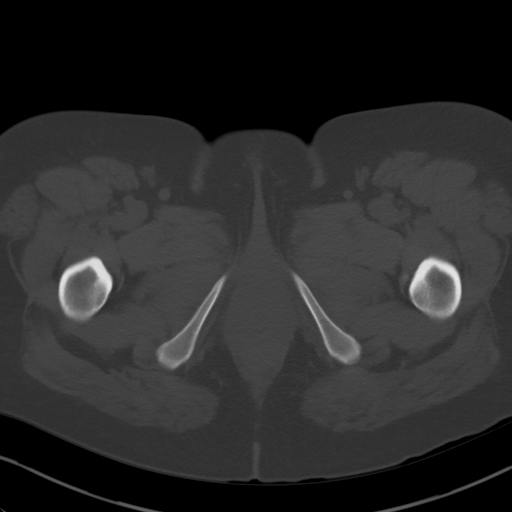
[im 13/97  soft-tissue]
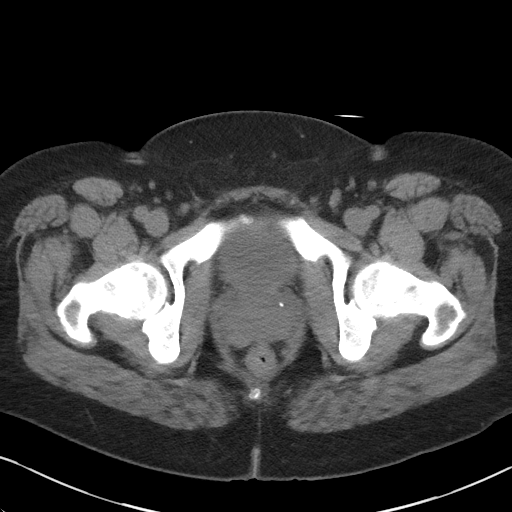
[im 21/97  soft-tissue]
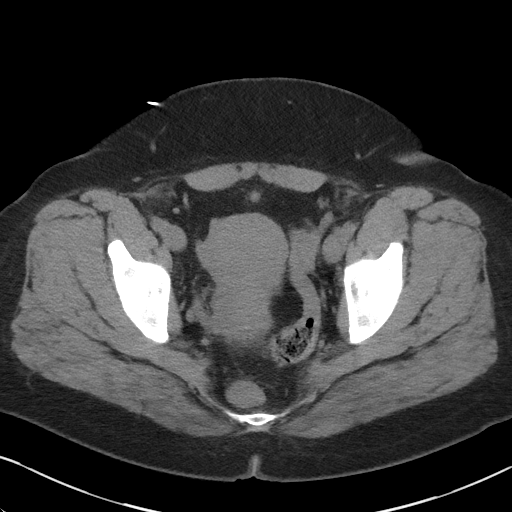
[im 26/97  soft-tissue]
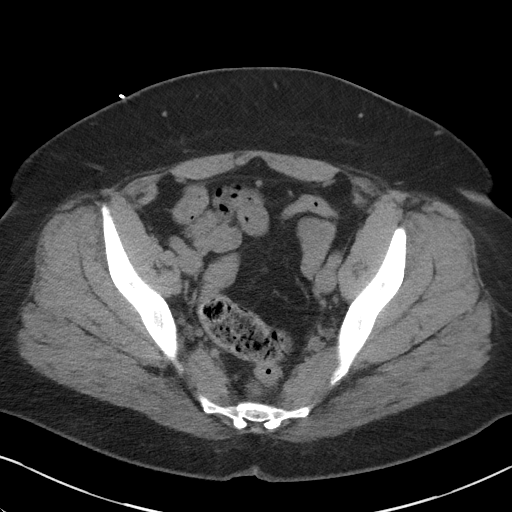
[im 34/97  soft-tissue]
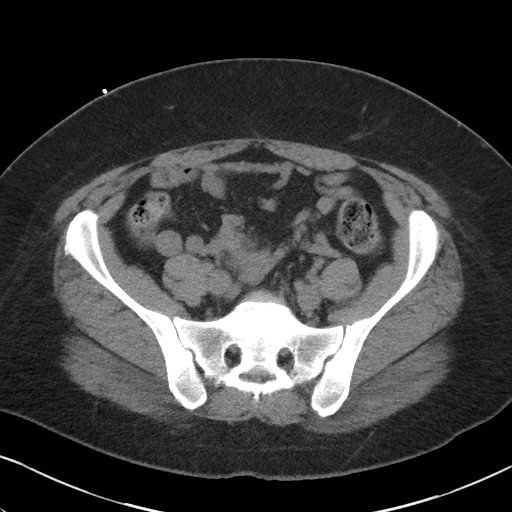
[im 42/97  soft-tissue]
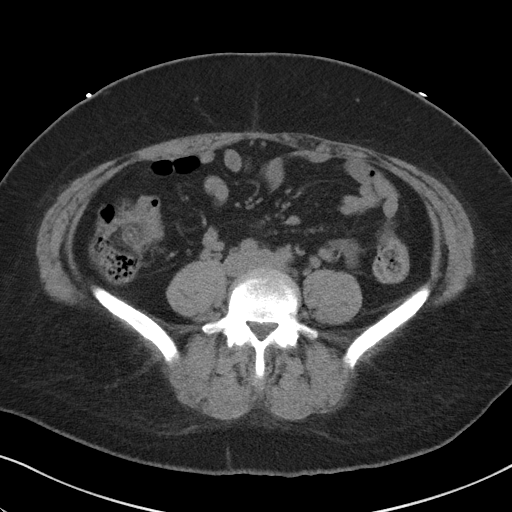
[im 51/97  soft-tissue]
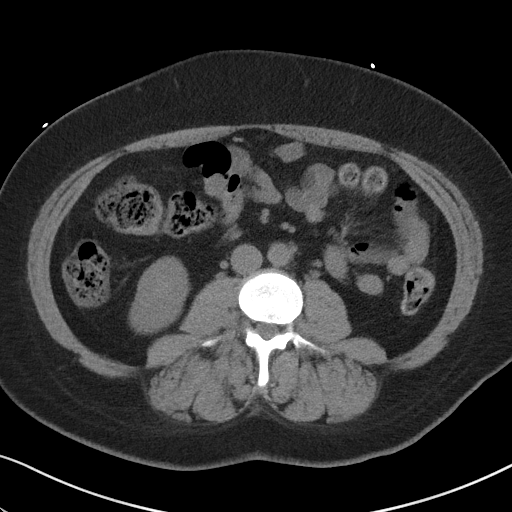
[im 55/97  soft-tissue]
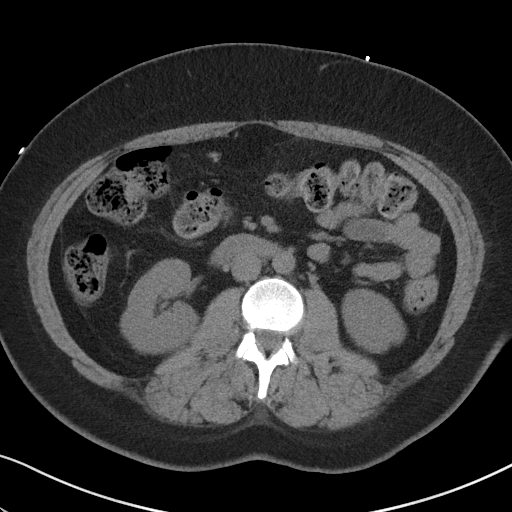
[im 63/97  soft-tissue]
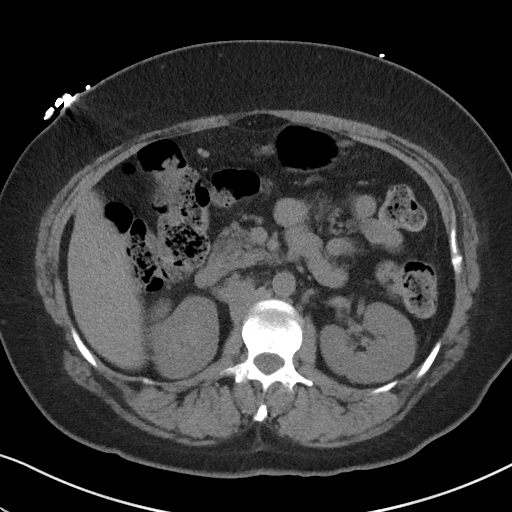
[im 63/97  bone]
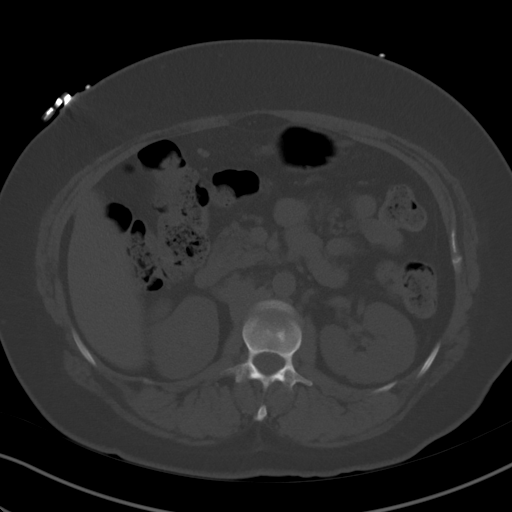
[im 71/97  soft-tissue]
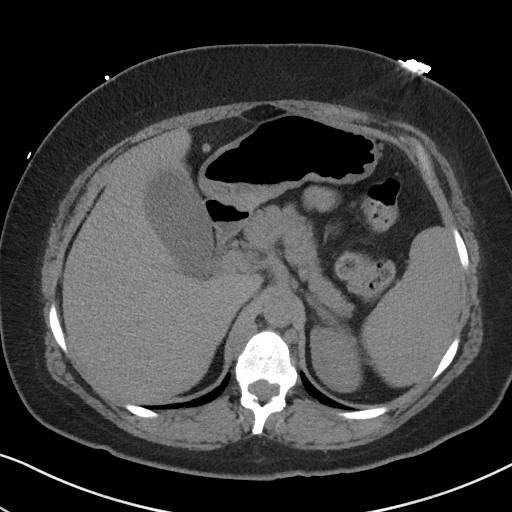
[im 76/97  soft-tissue]
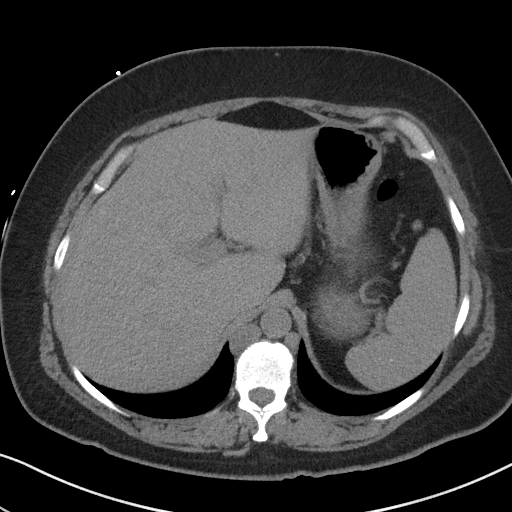
[im 84/97  soft-tissue]
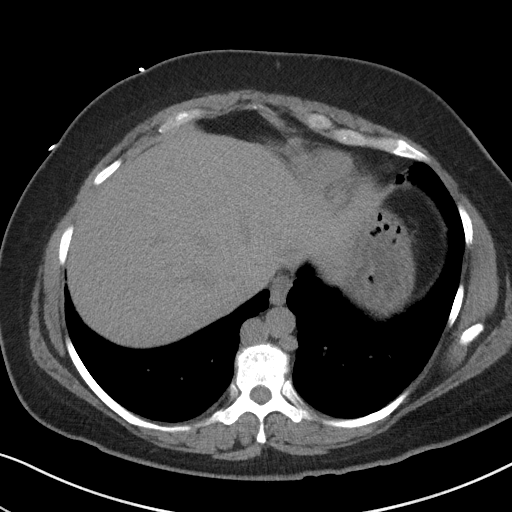
[im 92/97  soft-tissue]
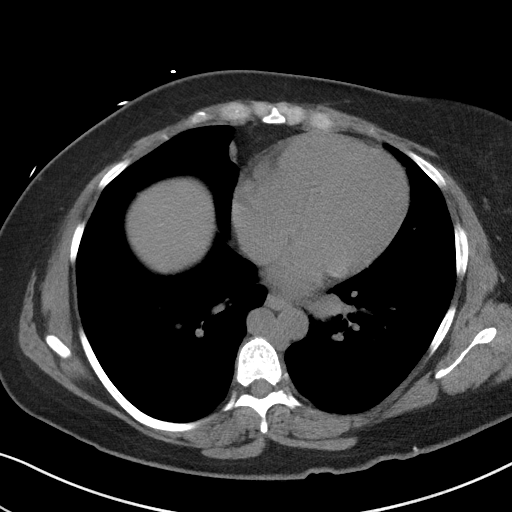

[Series 5: coronal · coronal · 0.80mm/px · 3 of 152 slices shown]
[im 51/152  soft-tissue]
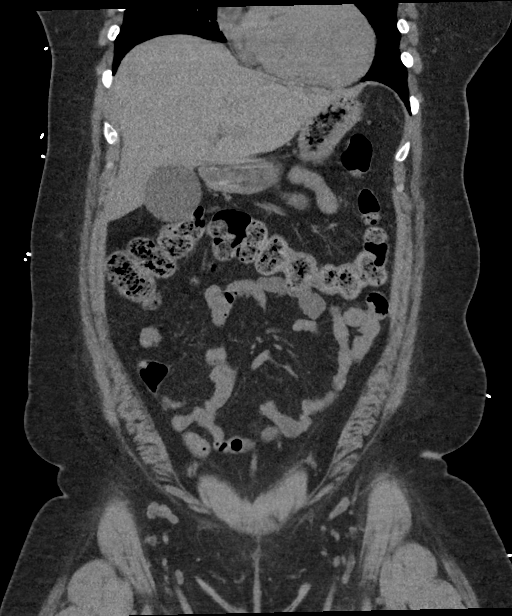
[im 68/152  soft-tissue]
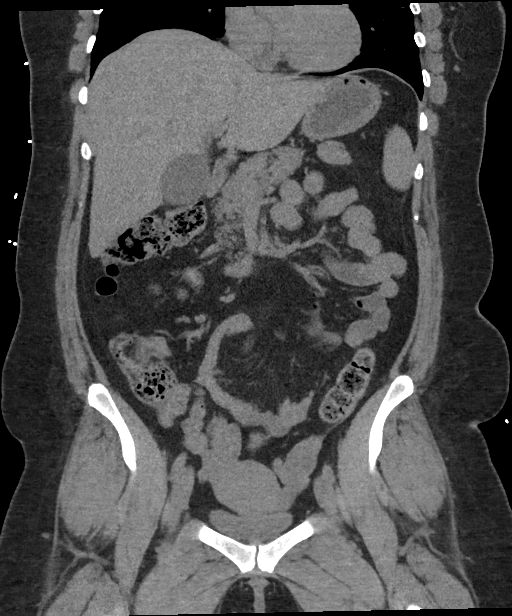
[im 84/152  soft-tissue]
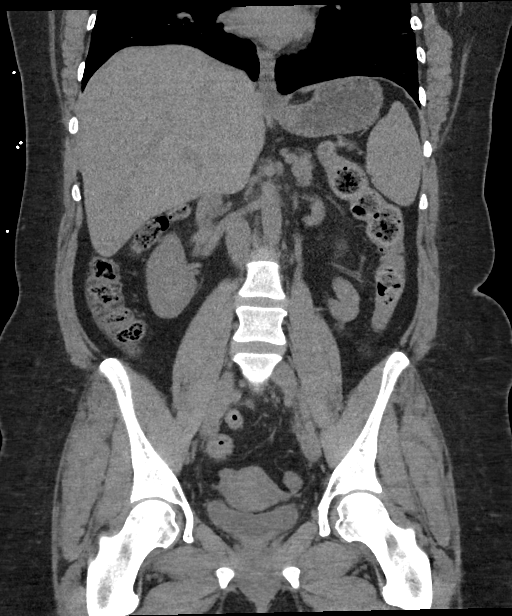

[16 of 46 positions shown; findings below may reference images not displayed]

FINDINGS: Lower chest: Clear lung bases. Normal heart size without pericardial
or pleural effusion. Azygos continuation of the IVC, with an
enlarged azygos and hemi azygous veins.

Hepatobiliary: Normal liver. Normal gallbladder, without biliary
ductal dilatation.

Pancreas: Normal, without mass or ductal dilatation.

Spleen: Normal in size, without focal abnormality.

Adrenals/Urinary Tract: Normal adrenal glands. No renal calculi or
hydronephrosis. No hydroureter or ureteric calculi. No bladder
calculi.

Stomach/Bowel: Normal stomach, without wall thickening. Colonic
stool burden suggests constipation. Normal terminal ileum and
appendix. Normal small bowel.

Vascular/Lymphatic: Normal caliber of the aorta and branch vessels.
Azygos configuration of the IVC. No abdominopelvic adenopathy.

Reproductive: Normal uterus and adnexa.

Other: No significant free fluid.

Musculoskeletal: No acute osseous abnormality.
IMPRESSION: 1.  No urinary tract calculi or hydronephrosis.
2. Otherwise, low sensitivity exam secondary to stone study
technique.
3.  Possible constipation.
4. Azygos continuation of the IVC.

## 2018-03-09 IMAGING — CR DG CHEST 2V
1 series · 2 of 2 positions shown · non-contrast
Comparison: None.

CLINICAL DATA: Patient reports pain under right breast that
radiates around into her back. Reports pain started shortly after
eating garlic knots. Reports vomited x 1

EXAM:
CHEST - 2 VIEW

[Series 1: dg chest 2 view · 0.14mm/px · 2 of 2 slices shown]
[im 1/2]
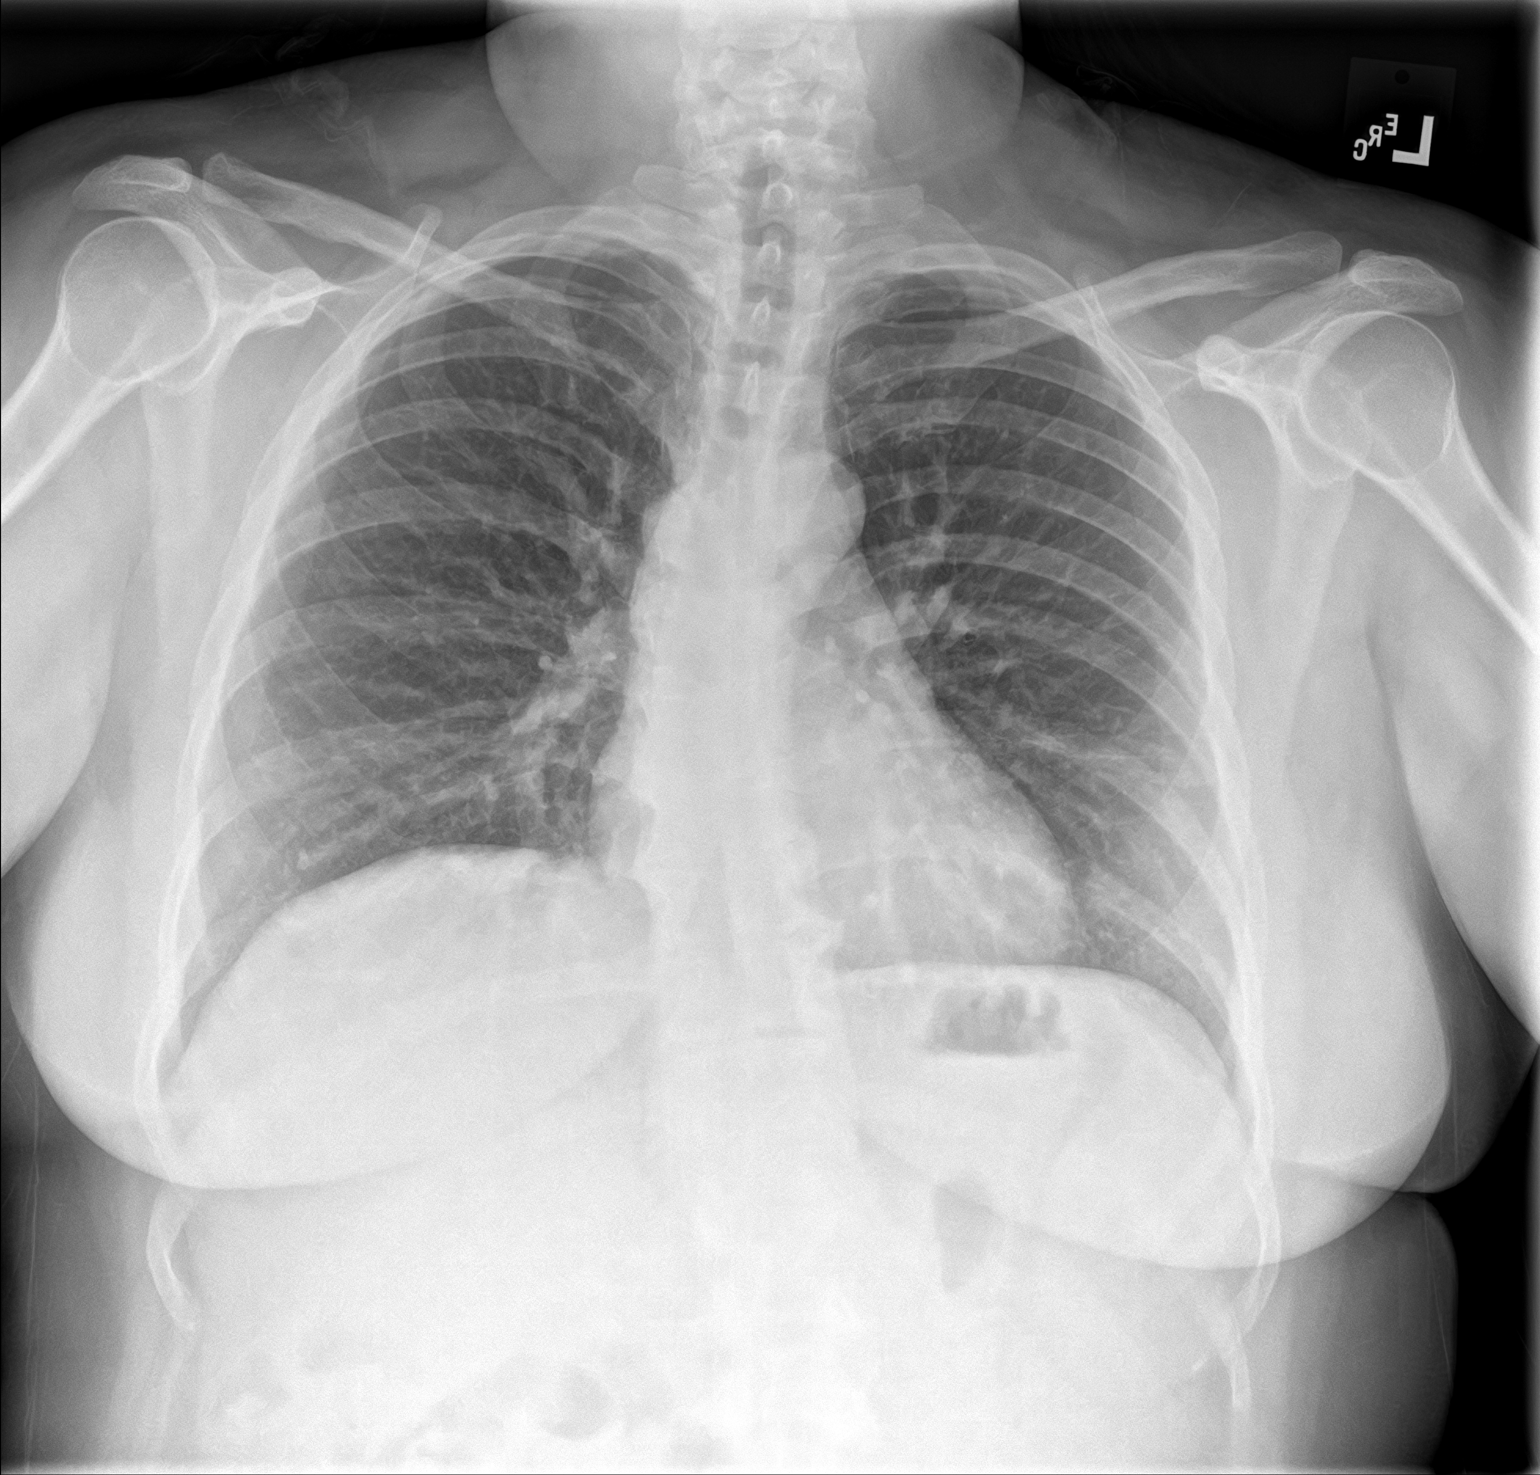
[im 2/2]
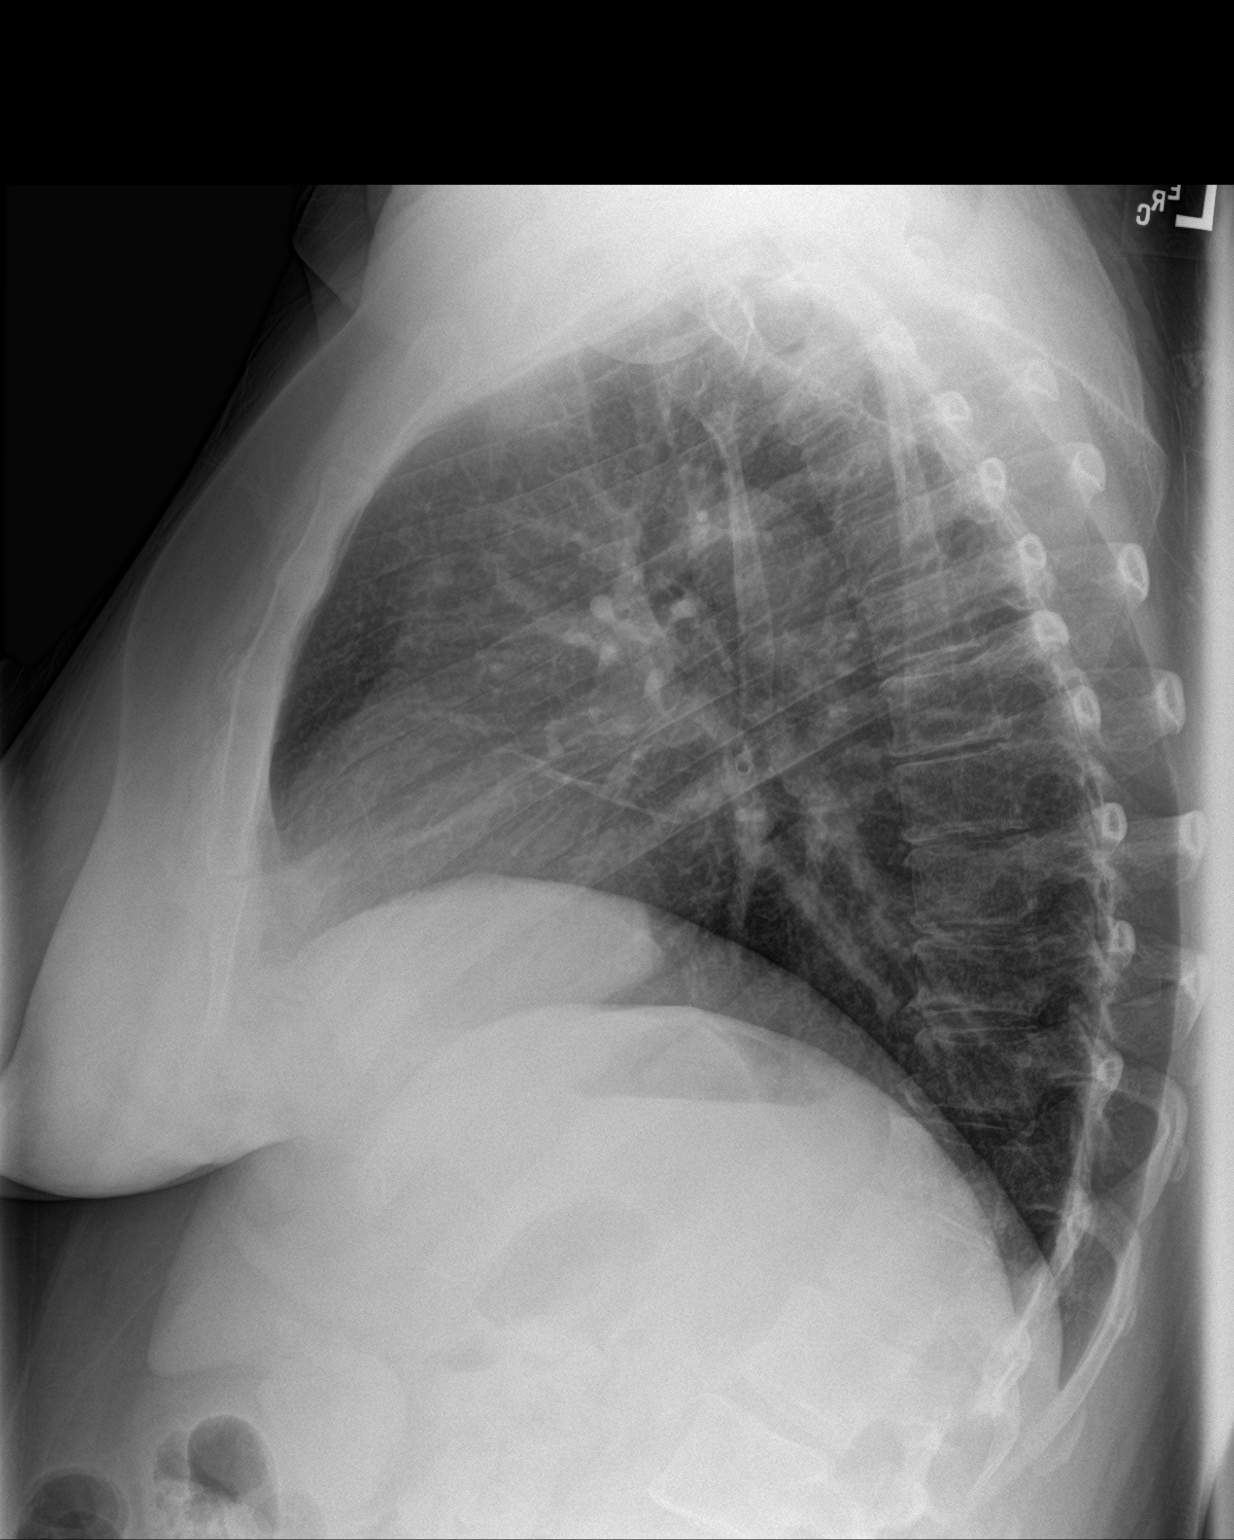

[2 of 2 positions shown; findings below may reference images not displayed]

FINDINGS: Midline trachea.  Normal heart size and mediastinal contours.

Sharp costophrenic angles.  No pneumothorax.  Clear lungs.

Mild right hemidiaphragm elevation. Mild convex right thoracic spine
curvature.
IMPRESSION: No active cardiopulmonary disease.

## 2018-03-09 MED ORDER — KETOROLAC TROMETHAMINE 30 MG/ML IJ SOLN
15.0000 mg | Freq: Once | INTRAMUSCULAR | Status: AC
  Administered 2018-03-09: 15 mg via INTRAVENOUS
  Filled 2018-03-09: qty 1

## 2018-03-09 MED ORDER — ONDANSETRON HCL 4 MG/2ML IJ SOLN
INTRAMUSCULAR | Status: AC
  Administered 2018-03-09: 4 mg via INTRAVENOUS
  Filled 2018-03-09: qty 2

## 2018-03-09 MED ORDER — ONDANSETRON HCL 4 MG/2ML IJ SOLN
4.0000 mg | Freq: Once | INTRAMUSCULAR | Status: AC | PRN
  Administered 2018-03-09: 4 mg via INTRAVENOUS
  Filled 2018-03-09: qty 2

## 2018-03-09 MED ORDER — ONDANSETRON HCL 4 MG/2ML IJ SOLN
4.0000 mg | Freq: Once | INTRAMUSCULAR | Status: AC
  Administered 2018-03-09: 4 mg via INTRAVENOUS

## 2018-03-09 MED ORDER — MORPHINE SULFATE (PF) 4 MG/ML IV SOLN
4.0000 mg | Freq: Once | INTRAVENOUS | Status: AC
  Administered 2018-03-09: 4 mg via INTRAVENOUS

## 2018-03-09 MED ORDER — MORPHINE SULFATE (PF) 4 MG/ML IV SOLN
INTRAVENOUS | Status: AC
  Administered 2018-03-09: 4 mg via INTRAVENOUS
  Filled 2018-03-09: qty 1

## 2018-03-09 MED ORDER — FAMOTIDINE IN NACL 20-0.9 MG/50ML-% IV SOLN
20.0000 mg | Freq: Once | INTRAVENOUS | Status: AC
  Administered 2018-03-09: 20 mg via INTRAVENOUS
  Filled 2018-03-09: qty 50

## 2018-03-09 MED ORDER — FENTANYL CITRATE (PF) 100 MCG/2ML IJ SOLN
50.0000 ug | Freq: Once | INTRAMUSCULAR | Status: AC
  Administered 2018-03-09: 50 ug via INTRAVENOUS
  Filled 2018-03-09: qty 2

## 2018-03-09 MED ORDER — SODIUM CHLORIDE 0.9 % IV BOLUS
1000.0000 mL | Freq: Once | INTRAVENOUS | Status: AC
  Administered 2018-03-09: 1000 mL via INTRAVENOUS

## 2018-03-09 NOTE — ED Notes (Signed)
PT reports a dull pain starting around 4 or 5pm tonight. Pt states that pain became worse after eating pizza and garlic knots for dinner. Reports Right sided abdominal pain that radiates towards her back. Husband is at the bedside.

## 2018-03-09 NOTE — ED Triage Notes (Signed)
Patient reports pain under right breast that radiates around into her back.  Reports pain started shortly after eating garlic knots.  Reports vomited x 1.

## 2018-03-09 NOTE — ED Provider Notes (Signed)
[facility] Emergency Department Provider Note  ____________________________________________  Time seen: Approximately 11:21 PM  I have reviewed the triage vital signs and the nursing notes.   HISTORY  Chief Complaint Chest Pain   HPI Brenda [name] is a 37 y.o. female with a history of hypertension, GERD, depression who presents for evaluation of abdominal pain.  Patient reports sudden onset of pain after eating garlic knots and pizza this evening.  The pain is dull and burning, located in the epigastric, right upper quadrant region and radiating to her back.  She has had several episodes of nonbloody nonbilious emesis.  No fever or chills, no diarrhea constipation, no dysuria or hematuria.  No prior abdominal surgeries.  Patient complains of 10 out of 10 pain.  Denies ever having similar pain.  She reports that yesterday she drank a lot of wine but had no pain until this evening.  She denies hematemesis or chest pain.  Past Medical History:  Diagnosis Date  . ADHD, adult residual type   . Anxiety   . Depression   . GERD (gastroesophageal reflux disease)   . Hypertension   . Wears contact lenses     Patient Active Problem List   Diagnosis Date Noted  . Blood in stool   . Rectal polyp   . Heartburn   . Nausea   . Adult ADHD 12/07/2014    Past Surgical History:  Procedure Laterality Date  . BREAST REDUCTION SURGERY    . COLONOSCOPY WITH PROPOFOL N/A 11/09/2016   Procedure: COLONOSCOPY WITH PROPOFOL;  Surgeon: [name] Minium, MD;  Location: [facility];  Service: Endoscopy;  Laterality: N/A;  . ESOPHAGOGASTRODUODENOSCOPY (EGD) WITH PROPOFOL N/A 11/09/2016   Procedure: ESOPHAGOGASTRODUODENOSCOPY (EGD) WITH PROPOFOL;  Surgeon: [name] Minium, MD;  Location: [facility];  Service: Endoscopy;  Laterality: N/A;  . TONSILLECTOMY      Prior to Admission medications   Medication Sig Start Date End Date Taking? Authorizing Provider    ADDERALL XR 20 MG 24 hr capsule Take 1 capsule by mouth daily. Dr Evelene Croon 11/23/15   [provider]  ALPRAZolam Prudy Feeler) 1 MG tablet Take 1 mg by mouth at bedtime as needed for anxiety.    [provider]  azithromycin (ZITHROMAX) 250 MG tablet 2 today then 1 a day for 4 days 02/28/18   Duanne Limerick, MD  hydrochlorothiazide (HYDRODIURIL) 25 MG tablet Take 1 tablet (25 mg total) by mouth daily. 02/18/18   Duanne Limerick, MD  meloxicam (MOBIC) 15 MG tablet Take 1 tablet (15 mg total) by mouth daily. 02/18/18   Duanne Limerick, MD  pantoprazole (PROTONIX) 40 MG tablet Take 1 tablet (40 mg total) by mouth daily. Take this in place of Dexilant 03/05/18   Duanne Limerick, MD  propranolol (INDERAL) 10 MG tablet TAKE 1 TABLET BY MOUTH DAILY AS NEEDED FOR PRESENTATION 02/18/18   Duanne Limerick, MD  traZODone (DESYREL) 50 MG tablet Take 50 mg by mouth at bedtime. 1-3 tabs at bedtime Dr Evelene Croon    [provider]    Allergies Ace inhibitors  Family History  Problem Relation Age of Onset  . Heart disease Maternal Grandmother   . Heart disease Maternal Grandfather   . Diabetes Paternal Grandfather     Social History Social History   Tobacco Use  . Smoking status: Never Smoker  . Smokeless tobacco: Never Used  Substance Use Topics  . Alcohol use: Yes    Alcohol/week: 10.0  standard drinks    Types: 5 Glasses of wine, 5 Cans of beer per week  . Drug use: No    Review of Systems  Constitutional: Negative for fever. Eyes: Negative for visual changes. ENT: Negative for sore throat. Neck: No neck pain  Cardiovascular: Negative for chest pain. Respiratory: Negative for shortness of breath. Gastrointestinal: + RUQ/ epigastric abdominal pain, nausea and vomiting. No diarrhea. Genitourinary: Negative for dysuria. Musculoskeletal: + upper back pain. Skin: Negative for rash. Neurological: Negative for headaches, weakness or numbness. Psych: No SI or  HI  ____________________________________________   PHYSICAL EXAM:  VITAL SIGNS: Vitals:   03/09/18 2230 03/09/18 2332  BP: 127/78 135/87  Pulse:  77  Resp: (!) 21 14  Temp:  98.2 F (36.8 C)  SpO2:  94%   Constitutional: Alert and oriented, patient rolling and screaming in pain.  HEENT:      Head: Normocephalic and atraumatic.         Eyes: Conjunctivae are normal. Sclera is non-icteric.       Mouth/Throat: Mucous membranes are dry and cracked lips       Neck: Supple with no signs of meningismus. Cardiovascular: Regular rate and rhythm. No murmurs, gallops, or rubs. 2+ symmetrical distal pulses are present in all extremities. No JVD. Respiratory: Normal respiratory effort. Lungs are clear to auscultation bilaterally. No wheezes, crackles, or rhonchi.  Gastrointestinal: Soft, significant tenderness to palpation over the epigastric and RUQ, negative Murphy's sign, and non distended with positive bowel sounds. No rebound or guarding. Genitourinary: No CVA tenderness. Musculoskeletal: Nontender with normal range of motion in all extremities. No edema, cyanosis, or erythema of extremities. Neurologic: Normal speech and language. Face is symmetric. Moving all extremities. No gross focal neurologic deficits are appreciated. Skin: Skin is warm, dry and intact. No rash noted. Psychiatric: Mood and affect are normal. Speech and behavior are normal.  ____________________________________________   LABS (all labs ordered are listed, but only abnormal results are displayed)  Labs Reviewed  BASIC METABOLIC PANEL - Abnormal; Notable for the following components:      Result Value   Glucose, Bld 114 (*)    All other components within normal limits  LIPASE, BLOOD - Abnormal; Notable for the following components:   Lipase 67 (*)    All other components within normal limits  HEPATIC FUNCTION PANEL - Abnormal; Notable for the following components:   Indirect Bilirubin 0.2 (*)    All other  components within normal limits  CBC  TROPONIN I  POC URINE PREG, ED  POCT PREGNANCY, URINE   ____________________________________________  EKG  ED ECG REPORT I, Nita Sickle, the attending physician, personally viewed and interpreted this ECG.  Normal sinus rhythm, rate of 83, normal intervals, normal axis, no ST elevations or depressions, Q waves in lead III.  No prior for comparison. ____________________________________________  RADIOLOGY  I have personally reviewed the images performed during this visit and I agree with the Radiologist's read.   Interpretation by Radiologist:  Dg Chest 2 View  Result Date: 03/09/2018 CLINICAL DATA:  Patient reports pain under right breast that radiates around into her back. Reports pain started shortly after eating garlic knots. Reports vomited x 1 EXAM: CHEST - 2 VIEW COMPARISON:  None. FINDINGS: Midline trachea.  Normal heart size and mediastinal contours. Sharp costophrenic angles.  No pneumothorax.  Clear lungs. Mild right hemidiaphragm elevation. Mild convex right thoracic spine curvature. IMPRESSION: No active cardiopulmonary disease. Electronically Signed   By: Hosie Spangle.D.  On: 03/09/2018 20:27   Ct Renal Stone Study  Result Date: 03/09/2018 CLINICAL DATA:  Right upper quadrant pain. Nausea and vomiting since 5 p.m. EXAM: CT ABDOMEN AND PELVIS WITHOUT CONTRAST TECHNIQUE: Multidetector CT imaging of the abdomen and pelvis was performed following the standard protocol without IV contrast. COMPARISON:  03/09/2018 right upper quadrant ultrasound. FINDINGS: Lower chest: Clear lung bases. Normal heart size without pericardial or pleural effusion. Azygos continuation of the IVC, with an enlarged azygos and hemi azygous veins. Hepatobiliary: Normal liver. Normal gallbladder, without biliary ductal dilatation. Pancreas: Normal, without mass or ductal dilatation. Spleen: Normal in size, without focal abnormality. Adrenals/Urinary Tract:  Normal adrenal glands. No renal calculi or hydronephrosis. No hydroureter or ureteric calculi. No bladder calculi. Stomach/Bowel: Normal stomach, without wall thickening. Colonic stool burden suggests constipation. Normal terminal ileum and appendix. Normal small bowel. Vascular/Lymphatic: Normal caliber of the aorta and branch vessels. Azygos configuration of the IVC. No abdominopelvic adenopathy. Reproductive: Normal uterus and adnexa. Other: No significant free fluid. Musculoskeletal: No acute osseous abnormality. IMPRESSION: 1.  No urinary tract calculi or hydronephrosis. 2. Otherwise, low sensitivity exam secondary to stone study technique. 3.  Possible constipation. 4. Azygos continuation of the IVC. Electronically Signed   By: Jeronimo GreavesKyle  Talbot M.D.   On: 03/09/2018 23:56   [place] Abdomen Limited Ruq  Result Date: 03/09/2018 CLINICAL DATA:  Abdominal pain radiating to the back. EXAM: ULTRASOUND ABDOMEN LIMITED RIGHT UPPER QUADRANT COMPARISON:  None. FINDINGS: Gallbladder: Physiologically distended. No gallstones or wall thickening visualized. No pericholecystic fluid. No sonographic Murphy sign noted by sonographer. Common bile duct: Diameter: 5 mm. Liver: No focal lesion identified. Within normal limits in parenchymal echogenicity. Portal vein is patent on color Doppler imaging with normal direction of blood flow towards the liver. IMPRESSION: Normal right upper quadrant ultrasound. Electronically Signed   By: Narda RutherfordMelanie  Sanford M.D.   On: 03/09/2018 21:52     ____________________________________________   PROCEDURES  Procedure(s) performed: None Procedures Critical Care performed:  None ____________________________________________   INITIAL IMPRESSION / ASSESSMENT AND PLAN / ED COURSE   37 y.o. female with a history of hypertension, GERD, depression who presents for evaluation of abdominal pain.  Patient with severe right upper quadrant epigastric abdominal pain after eating today.  Patient also  had large intake of alcohol yesterday evening.  She is tender to palpation on the epigastric and right upper quadrant.  Labs showing elevated lipase, normal LFTs and normal white count.  Ultrasound negative for cholelithiasis or cholecystitis.  Due to patient's significant pain she was sent for a CT abdomen pelvis which is negative for stone.  At this time presentation is concerning for pancreatitis most likely from alcohol abuse.  Will reassess after second round of pain medication. Care transferred to Dr. Lamont Snowballifenbark      As part of my medical decision making, I reviewed the following data within the electronic MEDICAL RECORD NUMBER Nursing notes reviewed and incorporated, Labs reviewed , EKG interpreted , Old chart reviewed, Radiograph reviewed , Notes from prior ED visits and [place] Controlled Substance Database    Pertinent labs & imaging results that were available during my care of the patient were reviewed by me and considered in my medical decision making (see chart for details).    ____________________________________________   FINAL CLINICAL IMPRESSION(S) / ED DIAGNOSES  Final diagnoses:  Abdominal pain  Alcohol-induced acute pancreatitis without infection or necrosis      NEW MEDICATIONS STARTED DURING THIS VISIT:  ED Discharge Orders  None       Note:  This document was prepared using Dragon voice recognition software and may include unintentional dictation errors.    Don Perking, [place], MD 03/10/18 [id number]

## 2018-03-09 NOTE — ED Triage Notes (Signed)
Patient with complaint of right sided chest pain radiating to her back with shortness of breath and nausea.

## 2018-03-10 ENCOUNTER — Encounter: Payer: Self-pay | Admitting: Internal Medicine

## 2018-03-10 ENCOUNTER — Other Ambulatory Visit: Payer: Self-pay

## 2018-03-10 DIAGNOSIS — R109 Unspecified abdominal pain: Secondary | ICD-10-CM

## 2018-03-10 DIAGNOSIS — R1011 Right upper quadrant pain: Secondary | ICD-10-CM | POA: Diagnosis not present

## 2018-03-10 DIAGNOSIS — R1013 Epigastric pain: Secondary | ICD-10-CM | POA: Diagnosis present

## 2018-03-10 MED ORDER — ONDANSETRON 4 MG PO TBDP: 4.0000 mg | ORAL_TABLET | Freq: Three times a day (TID) | ORAL | 0 refills | Status: DC | PRN

## 2018-03-10 MED ORDER — SUCRALFATE 1 GM/10ML PO SUSP: 1.0000 g | Freq: Three times a day (TID) | ORAL | 0 refills | Status: DC

## 2018-03-10 MED ORDER — OXYCODONE-ACETAMINOPHEN 5-325 MG PO TABS: 1.0000 | ORAL_TABLET | ORAL | 0 refills | Status: DC | PRN

## 2018-03-10 MED ORDER — CYCLOBENZAPRINE HCL 5 MG PO TABS: 5.0000 mg | ORAL_TABLET | Freq: Every day | ORAL | 0 refills | Status: DC

## 2018-03-10 MED ORDER — AMPHETAMINE-DEXTROAMPHET ER 5 MG PO CP24
20.0000 mg | ORAL_CAPSULE | Freq: Every day | ORAL | Status: DC
  Filled 2018-03-10: qty 1

## 2018-03-10 MED ORDER — SUCRALFATE 1 GM/10ML PO SUSP
1.0000 g | Freq: Three times a day (TID) | ORAL | Status: DC
  Administered 2018-03-10 (×3): 1 g via ORAL
  Filled 2018-03-10 (×5): qty 10

## 2018-03-10 MED ORDER — ALPRAZOLAM 1 MG PO TABS: 1.0000 mg | ORAL_TABLET | Freq: Every evening | ORAL | Status: DC | PRN

## 2018-03-10 MED ORDER — MORPHINE SULFATE (PF) 4 MG/ML IV SOLN: 4.0000 mg | Freq: Once | INTRAVENOUS | Status: DC

## 2018-03-10 MED ORDER — GI COCKTAIL ~~LOC~~
30.0000 mL | Freq: Once | ORAL | Status: AC
  Administered 2018-03-10: 30 mL via ORAL
  Filled 2018-03-10: qty 30

## 2018-03-10 MED ORDER — ONDANSETRON HCL 4 MG/2ML IJ SOLN: 4.0000 mg | Freq: Four times a day (QID) | INTRAMUSCULAR | Status: DC | PRN

## 2018-03-10 MED ORDER — ENOXAPARIN SODIUM 40 MG/0.4ML ~~LOC~~ SOLN: 40.0000 mg | SUBCUTANEOUS | Status: DC

## 2018-03-10 MED ORDER — ACETAMINOPHEN 500 MG PO TABS
1000.0000 mg | ORAL_TABLET | Freq: Once | ORAL | Status: AC
  Administered 2018-03-10: 1000 mg via ORAL
  Filled 2018-03-10: qty 2

## 2018-03-10 MED ORDER — CYCLOBENZAPRINE HCL 10 MG PO TABS
5.0000 mg | ORAL_TABLET | Freq: Three times a day (TID) | ORAL | Status: DC
  Administered 2018-03-10 (×2): 5 mg via ORAL
  Filled 2018-03-10 (×2): qty 1

## 2018-03-10 MED ORDER — SODIUM CHLORIDE 0.9 % IV BOLUS
1000.0000 mL | Freq: Once | INTRAVENOUS | Status: AC
  Administered 2018-03-10: 1000 mL via INTRAVENOUS

## 2018-03-10 MED ORDER — ONDANSETRON HCL 4 MG PO TABS: 4.0000 mg | ORAL_TABLET | Freq: Four times a day (QID) | ORAL | Status: DC | PRN

## 2018-03-10 MED ORDER — OXYCODONE HCL 5 MG PO TABS
5.0000 mg | ORAL_TABLET | Freq: Once | ORAL | Status: AC
  Administered 2018-03-10: 5 mg via ORAL
  Filled 2018-03-10: qty 1

## 2018-03-10 MED ORDER — HYDROMORPHONE HCL 1 MG/ML IJ SOLN: 0.5000 mg | INTRAMUSCULAR | Status: DC | PRN

## 2018-03-10 MED ORDER — MORPHINE SULFATE (PF) 2 MG/ML IV SOLN
INTRAVENOUS | Status: AC
  Administered 2018-03-10: 4 mg via INTRAVENOUS
  Filled 2018-03-10: qty 2

## 2018-03-10 MED ORDER — BISACODYL 5 MG PO TBEC: 5.0000 mg | DELAYED_RELEASE_TABLET | Freq: Every day | ORAL | Status: DC | PRN

## 2018-03-10 MED ORDER — SENNOSIDES-DOCUSATE SODIUM 8.6-50 MG PO TABS: 1.0000 | ORAL_TABLET | Freq: Every evening | ORAL | Status: DC | PRN

## 2018-03-10 MED ORDER — TRAZODONE HCL 50 MG PO TABS: 50.0000 mg | ORAL_TABLET | Freq: Every evening | ORAL | Status: DC | PRN

## 2018-03-10 MED ORDER — PANTOPRAZOLE SODIUM 40 MG PO TBEC
40.0000 mg | DELAYED_RELEASE_TABLET | Freq: Every day | ORAL | Status: DC
  Administered 2018-03-10: 40 mg via ORAL
  Filled 2018-03-10: qty 1

## 2018-03-10 MED ORDER — HALOPERIDOL LACTATE 5 MG/ML IJ SOLN
5.0000 mg | Freq: Once | INTRAMUSCULAR | Status: AC
  Administered 2018-03-10: 5 mg via INTRAVENOUS
  Filled 2018-03-10: qty 1

## 2018-03-10 NOTE — ED Notes (Signed)
Pt significant other told this RN that pt just vomited green bile.

## 2018-03-10 NOTE — H&P (Addendum)
[facility] - [place] [facility]   PATIENT NAME: Brenda [name]    MR#:  [id number]  DATE OF BIRTH:  12/26/1980  DATE OF ADMISSION:  03/09/2018  PRIMARY CARE PHYSICIAN: Duanne Limerick, MD   REQUESTING/REFERRING PHYSICIAN: Merrily Brittle, MD  CHIEF COMPLAINT:   Chief Complaint  Patient presents with  . Chest Pain    HISTORY OF PRESENT ILLNESS:  Brenda [name]  is a 37 y.o. female with a known history of GERD p/w AP. Pt states that she drank a lot of wine on Friday (09/13) evening. She states she ate pizza and garlic knots for dinner @~1700-1800PM on Saturday 09/14. ~1hr later, she developed epigastric/RUQ sharp + burning abdominal pain radiating to the midback and LUQ abdomen. She endorses N/V, bilious, (-) blood. She states that retching exacerbated her AP. EMS was called. She continues to have AP and nausea whilst in the ED, and at the time of my assessment. She states she has had GERD for > 32yr (appears to have been Dx in 10/2016), for which she has been on Dexilant. She states this was changed to Protonix by her PCP ~2d ago. She states she had an EGD performed by Dr. Servando Snare around that time (date 11/09/2016); report reviews, stomach normal. On present admission, AST/ALT/Bili WNL, Lipase 67, RUQ U/S (-) cholelithiasis/cholecystitis, CT A/P (-) nephrolithiasis or hepatobiliary/pancreatic abnl.  PAST MEDICAL HISTORY:   Past Medical History:  Diagnosis Date  . ADHD, adult residual type   . Anxiety   . Depression   . GERD (gastroesophageal reflux disease)   . Hypertension   . Wears contact lenses     PAST SURGICAL HISTORY:   Past Surgical History:  Procedure Laterality Date  . BREAST REDUCTION SURGERY    . COLONOSCOPY WITH PROPOFOL N/A 11/09/2016   Procedure: COLONOSCOPY WITH PROPOFOL;  Surgeon: [name] Minium, MD;  Location: [facility];  Service: Endoscopy;  Laterality: N/A;  . ESOPHAGOGASTRODUODENOSCOPY (EGD) WITH PROPOFOL N/A 11/09/2016    Procedure: ESOPHAGOGASTRODUODENOSCOPY (EGD) WITH PROPOFOL;  Surgeon: [name] Minium, MD;  Location: [facility];  Service: Endoscopy;  Laterality: N/A;  . TONSILLECTOMY      SOCIAL HISTORY:   Social History   Tobacco Use  . Smoking status: Never Smoker  . Smokeless tobacco: Never Used  Substance Use Topics  . Alcohol use: Yes    Alcohol/week: 10.0 standard drinks    Types: 5 Glasses of wine, 5 Cans of beer per week    FAMILY HISTORY:   Family History  Problem Relation Age of Onset  . Heart disease Maternal Grandmother   . Heart disease Maternal Grandfather   . Diabetes Paternal Grandfather     DRUG ALLERGIES:   Allergies  Allergen Reactions  . Ace Inhibitors Swelling    Lips/ face    REVIEW OF SYSTEMS:   Review of Systems  Constitutional: Negative for chills, diaphoresis, fever, malaise/fatigue and weight loss.  HENT: Negative for congestion, ear pain, hearing loss, nosebleeds, sinus pain, sore throat and tinnitus.   Eyes: Negative for blurred vision, double vision and photophobia.  Respiratory: Negative for cough, hemoptysis, sputum production, shortness of breath and wheezing.   Cardiovascular: Negative for chest pain, palpitations, orthopnea, claudication, leg swelling and PND.  Gastrointestinal: Positive for abdominal pain, heartburn, nausea and vomiting. Negative for blood in stool, constipation, diarrhea and melena.  Genitourinary: Negative for dysuria, frequency, hematuria and urgency.  Musculoskeletal: Positive for back pain. Negative for joint pain, myalgias and neck pain.  Skin: Negative for itching and rash.  Neurological: Negative for dizziness, tingling, tremors, sensory change, speech change, focal weakness, seizures, loss of consciousness, weakness and headaches.  Psychiatric/Behavioral: Negative for memory loss. The patient does not have insomnia.    MEDICATIONS AT HOME:   Prior to Admission medications   Medication Sig Start Date End Date  Taking? Authorizing Provider  ADDERALL XR 20 MG 24 hr capsule Take 1 capsule by mouth daily. Dr Evelene CroonKaur 11/23/15  Yes [provider]  ALPRAZolam Prudy Feeler(XANAX) 1 MG tablet Take 1 mg by mouth at bedtime as needed for anxiety.   Yes [provider]  amphetamine-dextroamphetamine (ADDERALL) 20 MG tablet Take 20 mg by mouth 2 (two) times daily. 02/21/18  Yes [provider]  hydrochlorothiazide (HYDRODIURIL) 25 MG tablet Take 1 tablet (25 mg total) by mouth daily. 02/18/18  Yes Duanne LimerickJones, Deanna C, MD  pantoprazole (PROTONIX) 40 MG tablet Take 1 tablet (40 mg total) by mouth daily. Take this in place of Dexilant 03/05/18  Yes Duanne LimerickJones, Deanna C, MD  propranolol (INDERAL) 10 MG tablet TAKE 1 TABLET BY MOUTH DAILY AS NEEDED FOR PRESENTATION 02/18/18  Yes Duanne LimerickJones, Deanna C, MD  traZODone (DESYREL) 50 MG tablet Take 50 mg by mouth at bedtime. 1-3 tabs at bedtime Dr Evelene CroonKaur   Yes [provider]  azithromycin (ZITHROMAX) 250 MG tablet 2 today then 1 a day for 4 days Patient not taking: Reported on 03/10/2018 02/28/18   Duanne LimerickJones, Deanna C, MD  meloxicam (MOBIC) 15 MG tablet Take 1 tablet (15 mg total) by mouth daily. Patient not taking: Reported on 03/10/2018 02/18/18   Duanne LimerickJones, Deanna C, MD  ondansetron (ZOFRAN ODT) 4 MG disintegrating tablet Take 1 tablet (4 mg total) by mouth every 8 (eight) hours as needed for nausea or vomiting. 03/10/18   Don PerkingVeronese, [place], MD  oxyCODONE-acetaminophen (PERCOCET) 5-325 MG tablet Take 1 tablet by mouth every 4 (four) hours as needed for severe pain. 03/10/18   Nita SickleVeronese, [place], MD      VITAL SIGNS:  Blood pressure (!) 136/93, pulse 92, temperature 98 F (36.7 C), temperature source Oral, resp. rate 16, height 5\' 4"  (1.626 m), weight 98.4 kg, last menstrual period 02/13/2018, SpO2 95 %.  PHYSICAL EXAMINATION:  Physical Exam  Constitutional: She is oriented to person, place, and time. She appears well-developed and well-nourished. She is active and cooperative.   Non-toxic appearance. She does not appear ill. She appears distressed (2/2 AP). She is not intubated.  HENT:  Head: Normocephalic and atraumatic.  Mouth/Throat: Oropharynx is clear and moist. No oropharyngeal exudate.  Eyes: Conjunctivae, EOM and lids are normal. No scleral icterus.  Neck: Neck supple. No JVD present. No thyromegaly present.  Cardiovascular: Normal rate, regular rhythm, S1 normal and S2 normal.  No extrasystoles are present. PMI is not displaced. Exam reveals no gallop, no S3, no S4, no distant heart sounds and no friction rub.  No murmur heard. Pulmonary/Chest: Breath sounds normal. No accessory muscle usage or stridor. No apnea, no tachypnea and no bradypnea. She is not intubated. No respiratory distress. She has no decreased breath sounds. She has no wheezes. She has no rhonchi. She has no rales.  Abdominal: Soft. She exhibits no distension. Bowel sounds are decreased. There is generalized tenderness and tenderness in the right upper quadrant, epigastric area and left upper quadrant. There is no rebound and no guarding.  Musculoskeletal: Normal range of motion. She exhibits no edema or tenderness.       Right lower leg:  Normal. She exhibits no tenderness and no edema.       Left lower leg: Normal. She exhibits no tenderness and no edema.  Lymphadenopathy:    She has no cervical adenopathy.  Neurological: She is alert and oriented to person, place, and time. She is not disoriented.  Skin: Skin is warm, dry and intact. No rash noted. She is not diaphoretic. No erythema.  Psychiatric: Her speech is normal and behavior is normal. Judgment and thought content normal. Her mood appears anxious. She is not agitated. Cognition and memory are normal.   LABORATORY PANEL:   CBC Recent Labs  Lab 03/09/18 1955  WBC 8.3  HGB 14.1  HCT 40.5  PLT 342   ------------------------------------------------------------------------------------------------------------------  Chemistries    Recent Labs  Lab 03/09/18 1955  NA 138  K 3.5  CL 106  CO2 24  GLUCOSE 114*  BUN 10  CREATININE 0.62  CALCIUM 9.1  AST 37  ALT 43  ALKPHOS 52  BILITOT 0.3   ------------------------------------------------------------------------------------------------------------------  Cardiac Enzymes Recent Labs  Lab 03/09/18 1955  TROPONINI <0.03   ------------------------------------------------------------------------------------------------------------------  RADIOLOGY:  Dg Chest 2 View  Result Date: 03/09/2018 CLINICAL DATA:  Patient reports pain under right breast that radiates around into her back. Reports pain started shortly after eating garlic knots. Reports vomited x 1 EXAM: CHEST - 2 VIEW COMPARISON:  None. FINDINGS: Midline trachea.  Normal heart size and mediastinal contours. Sharp costophrenic angles.  No pneumothorax.  Clear lungs. Mild right hemidiaphragm elevation. Mild convex right thoracic spine curvature. IMPRESSION: No active cardiopulmonary disease. Electronically Signed   By: Jeronimo Greaves M.D.   On: 03/09/2018 20:27   Ct Renal Stone Study  Result Date: 03/09/2018 CLINICAL DATA:  Right upper quadrant pain. Nausea and vomiting since 5 p.m. EXAM: CT ABDOMEN AND PELVIS WITHOUT CONTRAST TECHNIQUE: Multidetector CT imaging of the abdomen and pelvis was performed following the standard protocol without IV contrast. COMPARISON:  03/09/2018 right upper quadrant ultrasound. FINDINGS: Lower chest: Clear lung bases. Normal heart size without pericardial or pleural effusion. Azygos continuation of the IVC, with an enlarged azygos and hemi azygous veins. Hepatobiliary: Normal liver. Normal gallbladder, without biliary ductal dilatation. Pancreas: Normal, without mass or ductal dilatation. Spleen: Normal in size, without focal abnormality. Adrenals/Urinary Tract: Normal adrenal glands. No renal calculi or hydronephrosis. No hydroureter or ureteric calculi. No bladder calculi.  Stomach/Bowel: Normal stomach, without wall thickening. Colonic stool burden suggests constipation. Normal terminal ileum and appendix. Normal small bowel. Vascular/Lymphatic: Normal caliber of the aorta and branch vessels. Azygos configuration of the IVC. No abdominopelvic adenopathy. Reproductive: Normal uterus and adnexa. Other: No significant free fluid. Musculoskeletal: No acute osseous abnormality. IMPRESSION: 1.  No urinary tract calculi or hydronephrosis. 2. Otherwise, low sensitivity exam secondary to stone study technique. 3.  Possible constipation. 4. Azygos continuation of the IVC. Electronically Signed   By: Jeronimo Greaves M.D.   On: 03/09/2018 23:56   [place] Abdomen Limited Ruq  Result Date: 03/09/2018 CLINICAL DATA:  Abdominal pain radiating to the back. EXAM: ULTRASOUND ABDOMEN LIMITED RIGHT UPPER QUADRANT COMPARISON:  None. FINDINGS: Gallbladder: Physiologically distended. No gallstones or wall thickening visualized. No pericholecystic fluid. No sonographic Murphy sign noted by sonographer. Common bile duct: Diameter: 5 mm. Liver: No focal lesion identified. Within normal limits in parenchymal echogenicity. Portal vein is patent on color Doppler imaging with normal direction of blood flow towards the liver. IMPRESSION: Normal right upper quadrant ultrasound. Electronically Signed   By: Ivette Loyal.D.  On: 03/09/2018 21:52   IMPRESSION AND PLAN:   A/P: 51F acute epigastric AP + N/V, suspect gastritis +/- ulcer. Lipase elevation. Hyperglycemia. -AP/N/V, lipase elevation: Pt p/w acute onset epigastric AP, nausea w/ bilious vomiting x1d. Imaging (-) biliary stones, renal stones, pancreatitis. Lipase elevated, possibly 2/2 N/V. I have a higher suspicion for gastritis +/- ulcer than I do for acute pancreatitis. IVF, pain ctrl, Protonix, Carafate. Mobic held. HCTZ can cause pancreatitis, held. Saw Dr. Servando Snare 10/2016, EGD 11/09/16 demonstrated normal stomach. Has been on Dexilant since then, up  until 2d ago, when it was changed to Protonix. GI consult. Diet as tolerated. -c/w home meds. -FEN/GI: Diet as tolerated. -DVT PPx: Lovenox. -Code status: Full code. -Disposition: Observation, < 2 midnights.   All the records are reviewed and case discussed with ED provider. Management plans discussed with the patient, family and they are in agreement.  CODE STATUS: Full code.  TOTAL TIME TAKING CARE OF THIS PATIENT: 75 minutes.    Barbaraann Rondo M.D on 03/10/2018 at 5:16 AM  Between 7am to 6pm - Pager - [phone]  After 6pm go to [email or web address] - Social research officer, government  [facility] [place] [facility]  Office  [phone]  CC: Primary care physician; Duanne Limerick, MD   Note: This dictation was prepared with Dragon dictation along with smaller phrase technology. Any transcriptional errors that result from this process are unintentional.

## 2018-03-10 NOTE — Progress Notes (Signed)
Admitted this morning for right upper quadrant pain, nausea.  Seen by gastroenterology, patient had a lot of vomiting yesterday associated with abdominal pain according to husband patient had few bottles of wine on Friday.  Likely acute gastritis CBC Chem-7, CT renal stone protocol, but ultrasound essentially normal.  Advance the diet, likely discharge later today if she tolerates.  Advised the patient if she continues to have right upper quadrant pain especially after eating fatty meals she needs a HIDA scan and follow-up with PCP.  Discharge instructions are in the computer.  Advised the patient to avoid any sedatives because of reflux.

## 2018-03-10 NOTE — Consult Note (Signed)
[name] Minium, MD [facility]  [address], [address] [place], [place] [address] Phone: [phone] Fax : [phone]  Consultation  Referring Provider:     Dr. Luberta Mutter Primary Care Physician:  Duanne Limerick, MD Primary Gastroenterologist:  Dr. Servando Snare         Reason for Consultation:     Nausea vomiting abdominal pain  Date of Admission:  03/09/2018 Date of Consultation:  03/10/2018         HPI:   Brenda [name] is a 37 y.o. female who was admitted after having nausea vomiting with abdominal pain.  The patient states that the abdominal pain started after she vomited and she had worsening of the pain each time she vomited.  The pain assisted until she got pain medication in the ER.  The patient states that she is not having any nausea or vomiting at the present time and her abdominal pain is much better.  The patient reports that the abdominal pain is in the epigastric area and on the right side that wraps around to her back.  There is no report of any unexplained weight loss fevers chills black stools or bloody stools.  The patient had an upper endoscopy by me last year without any ulcers or bleeding seen.  The patient did have some signs of the esophagus of reflux.  The patient had been put on Dexilant and reports that it was working well and 2 days prior to admission she had it switched to Protonix by her primary care provider. The abdominal pain is not made better or worse by anything she does and she reports that it feels better when she bends her knees.  There is no report of any sick contacts.  Two days prior to admission the patient had been drinking some wine and reports that she had pizza with garlic knots just prior to admission.  The patient had an ultrasound and CT scan of the abdomen that did not show any cause for her abdominal pain.  Past Medical History:  Diagnosis Date  . ADHD, adult residual type   . Anxiety   . Depression   . GERD (gastroesophageal reflux disease)   .  Hypertension   . Wears contact lenses     Past Surgical History:  Procedure Laterality Date  . BREAST REDUCTION SURGERY    . COLONOSCOPY WITH PROPOFOL N/A 11/09/2016   Procedure: COLONOSCOPY WITH PROPOFOL;  Surgeon: [name] Minium, MD;  Location: [facility];  Service: Endoscopy;  Laterality: N/A;  . ESOPHAGOGASTRODUODENOSCOPY (EGD) WITH PROPOFOL N/A 11/09/2016   Procedure: ESOPHAGOGASTRODUODENOSCOPY (EGD) WITH PROPOFOL;  Surgeon: [name] Minium, MD;  Location: [facility];  Service: Endoscopy;  Laterality: N/A;  . TONSILLECTOMY      Prior to Admission medications   Medication Sig Start Date End Date Taking? Authorizing Provider  ADDERALL XR 20 MG 24 hr capsule Take 1 capsule by mouth daily. Dr Evelene Croon 11/23/15  Yes [provider]  ALPRAZolam Prudy Feeler) 1 MG tablet Take 1 mg by mouth at bedtime as needed for anxiety.   Yes [provider]  amphetamine-dextroamphetamine (ADDERALL) 20 MG tablet Take 20 mg by mouth 2 (two) times daily. 02/21/18  Yes [provider]  hydrochlorothiazide (HYDRODIURIL) 25 MG tablet Take 1 tablet (25 mg total) by mouth daily. 02/18/18  Yes Duanne Limerick, MD  pantoprazole (PROTONIX) 40 MG tablet Take 1 tablet (40 mg total) by mouth daily. Take this in place of Dexilant 03/05/18  Yes Elizabeth Sauer  C, MD  propranolol (INDERAL) 10 MG tablet TAKE 1 TABLET BY MOUTH DAILY AS NEEDED FOR PRESENTATION 02/18/18  Yes Duanne Limerick, MD  traZODone (DESYREL) 50 MG tablet Take 50 mg by mouth at bedtime. 1-3 tabs at bedtime Dr Evelene Croon   Yes [provider]  azithromycin (ZITHROMAX) 250 MG tablet 2 today then 1 a day for 4 days Patient not taking: Reported on 03/10/2018 02/28/18   Duanne Limerick, MD  meloxicam (MOBIC) 15 MG tablet Take 1 tablet (15 mg total) by mouth daily. Patient not taking: Reported on 03/10/2018 02/18/18   Duanne Limerick, MD  ondansetron (ZOFRAN ODT) 4 MG disintegrating tablet Take 1 tablet (4 mg total) by mouth every 8  (eight) hours as needed for nausea or vomiting. 03/10/18   Don Perking, [place], MD  oxyCODONE-acetaminophen (PERCOCET) 5-325 MG tablet Take 1 tablet by mouth every 4 (four) hours as needed for severe pain. 03/10/18   Nita Sickle, MD    Family History  Problem Relation Age of Onset  . Heart disease Maternal Grandmother   . Heart disease Maternal Grandfather   . Diabetes Paternal Grandfather      Social History   Tobacco Use  . Smoking status: Never Smoker  . Smokeless tobacco: Never Used  Substance Use Topics  . Alcohol use: Yes    Alcohol/week: 10.0 standard drinks    Types: 5 Glasses of wine, 5 Cans of beer per week  . Drug use: No    Allergies as of 03/09/2018 - Review Complete 03/09/2018  Allergen Reaction Noted  . Ace inhibitors Swelling 12/17/2015    Review of Systems:    All systems reviewed and negative except where noted in HPI.   Physical Exam:  Vital signs in last 24 hours: Temp:  [98 F (36.7 C)-98.2 F (36.8 C)] 98 F (36.7 C) (09/15 0335) Pulse Rate:  [69-92] 92 (09/15 0335) Resp:  [14-29] 16 (09/15 0335) BP: (113-144)/(74-104) 136/93 (09/15 0335) SpO2:  [91 %-99 %] 95 % (09/15 0335) Weight:  [98.4 kg] 98.4 kg (09/15 0335) Last BM Date: 03/09/18 General:   Pleasant, cooperative in NAD Head:  Normocephalic and atraumatic. Eyes:   No icterus.   Conjunctiva pink. PERRLA. Ears:  Normal auditory acuity. Neck:  Supple; no masses or thyroidomegaly Lungs: Respirations even and unlabored. Lungs clear to auscultation bilaterally.   No wheezes, crackles, or rhonchi.  Heart:  Regular rate and rhythm;  Without murmur, clicks, rubs or gallops Abdomen:  Soft, nondistended, there is tenderness to one finger palpation and even without palpating the abdominal wall by raising the patient's legs above the bed.. Normal bowel sounds. No appreciable masses or hepatomegaly.  No rebound or guarding.  Rectal:  Not performed. Msk:  Symmetrical without gross deformities.      Extremities:  Without edema, cyanosis or clubbing. Neurologic:  Alert and oriented x3;  grossly normal neurologically. Skin:  Intact without significant lesions or rashes. Cervical Nodes:  No significant cervical adenopathy. Psych:  Alert and cooperative. Normal affect.  LAB RESULTS: Recent Labs    03/09/18 1955  WBC 8.3  HGB 14.1  HCT 40.5  PLT 342   BMET Recent Labs    03/09/18 1955  NA 138  K 3.5  CL 106  CO2 24  GLUCOSE 114*  BUN 10  CREATININE 0.62  CALCIUM 9.1   LFT Recent Labs    03/09/18 1955  PROT 7.1  ALBUMIN 3.7  AST 37  ALT 43  ALKPHOS 52  BILITOT  0.3  BILIDIR 0.1  IBILI 0.2*   PT/INR No results for input(s): LABPROT, INR in the last 72 hours.  STUDIES: Dg Chest 2 View  Result Date: 03/09/2018 CLINICAL DATA:  Patient reports pain under right breast that radiates around into her back. Reports pain started shortly after eating garlic knots. Reports vomited x 1 EXAM: CHEST - 2 VIEW COMPARISON:  None. FINDINGS: Midline trachea.  Normal heart size and mediastinal contours. Sharp costophrenic angles.  No pneumothorax.  Clear lungs. Mild right hemidiaphragm elevation. Mild convex right thoracic spine curvature. IMPRESSION: No active cardiopulmonary disease. Electronically Signed   By: Jeronimo Greaves M.D.   On: 03/09/2018 20:27   Ct Renal Stone Study  Result Date: 03/09/2018 CLINICAL DATA:  Right upper quadrant pain. Nausea and vomiting since 5 p.m. EXAM: CT ABDOMEN AND PELVIS WITHOUT CONTRAST TECHNIQUE: Multidetector CT imaging of the abdomen and pelvis was performed following the standard protocol without IV contrast. COMPARISON:  03/09/2018 right upper quadrant ultrasound. FINDINGS: Lower chest: Clear lung bases. Normal heart size without pericardial or pleural effusion. Azygos continuation of the IVC, with an enlarged azygos and hemi azygous veins. Hepatobiliary: Normal liver. Normal gallbladder, without biliary ductal dilatation. Pancreas: Normal, without  mass or ductal dilatation. Spleen: Normal in size, without focal abnormality. Adrenals/Urinary Tract: Normal adrenal glands. No renal calculi or hydronephrosis. No hydroureter or ureteric calculi. No bladder calculi. Stomach/Bowel: Normal stomach, without wall thickening. Colonic stool burden suggests constipation. Normal terminal ileum and appendix. Normal small bowel. Vascular/Lymphatic: Normal caliber of the aorta and branch vessels. Azygos configuration of the IVC. No abdominopelvic adenopathy. Reproductive: Normal uterus and adnexa. Other: No significant free fluid. Musculoskeletal: No acute osseous abnormality. IMPRESSION: 1.  No urinary tract calculi or hydronephrosis. 2. Otherwise, low sensitivity exam secondary to stone study technique. 3.  Possible constipation. 4. Azygos continuation of the IVC. Electronically Signed   By: Jeronimo Greaves M.D.   On: 03/09/2018 23:56   US Abdomen Limited Ruq  Result Date: 03/09/2018 CLINICAL DATA:  Abdominal pain radiating to the back. EXAM: ULTRASOUND ABDOMEN LIMITED RIGHT UPPER QUADRANT COMPARISON:  None. FINDINGS: Gallbladder: Physiologically distended. No gallstones or wall thickening visualized. No pericholecystic fluid. No sonographic Murphy sign noted by sonographer. Common bile duct: Diameter: 5 mm. Liver: No focal lesion identified. Within normal limits in parenchymal echogenicity. Portal vein is patent on color Doppler imaging with normal direction of blood flow towards the liver. IMPRESSION: Normal right upper quadrant ultrasound. Electronically Signed   By: Narda Rutherford M.D.   On: 03/09/2018 21:52      Impression / Plan:   Assessment: Active Problems:   Epigastric abdominal pain Nausea vomiting  Brenda [name] is a 37 y.o. y/o female with a history of reflux who had her Dexilant switched to Protonix 2 days prior to admission.  The patient had some nausea and vomiting which resulted in severe abdominal pain.  The patient's nausea vomiting  has resolved.  On physical exam her abdominal pain is clearly reproducible by flexion of the abdominal wall muscles and palpating the abdomen with one finger.  The patient's abdominal pain also increased just with straight leg lift without palpating the abdomen.  This is all consistent with musculoskeletal pain.  Because of patient nausea and vomiting that incited the muscular skeletal pain is unknown and may be due to her change of medication versus a acute gastroenteritis versus the change in her PPI.  Plan: The patient does not need any endoscopic procedures at this time since  her pain is clearly musculoskeletal.  The patient should continue her PPI and will be started on Flexeril.  I would avoid NSAIDs at this time due to her history of reflux.  The patient has been explained the plan and agrees with the diagnosis and plan.  Nothing further to do from a GI point review and if the patient tolerates her diet being advanced may be discharged and follow-up as outpatient.  Thank you for involving me in the care of this patient.      LOS: 0 days   [name] Miniumarren Charnika Herbst, MD  03/10/2018, 8:46 AM    Note: This dictation was prepared with Dragon dictation along with smaller phrase technology. Any transcriptional errors that result from this process are unintentional.

## 2018-03-11 ENCOUNTER — Telehealth: Payer: Self-pay

## 2018-03-11 NOTE — Telephone Encounter (Signed)
TOC #1. Called pt to f/u [facility]. At the time of this entry, pt has not scheduled a hosp f/u w/ Dr. Yetta BarreJones. Discharge planning includes the following:  - f/u PCP 1 week - Change dosage of Adderall - Begin Zofran, Flexaril and Carafate - Stop Zithromax - Resume regular diet LVM requesting returned call.

## 2018-03-11 NOTE — Discharge Summary (Signed)
[address] Brenda [name], is a 36 y.o. female  DOB 1981-04-27  MRN [id number].  Admission date:  03/09/2018  Admitting Physician  Barbaraann Rondo, MD  Discharge Date:  03/10/2018   Primary MD  Brenda Limerick, MD  Recommendations for primary care physician for things to follow:   Follow with PCP in 1 week   Admission Diagnosis  Abdominal pain [R10.9] Alcohol-induced acute pancreatitis without infection or necrosis [K85.20]   Discharge Diagnosis  Abdominal pain [R10.9] Alcohol-induced acute pancreatitis without infection or necrosis [K85.20]    Active Problems:   Epigastric abdominal pain   Abdominal pain      Past Medical History:  Diagnosis Date  . ADHD, adult residual type   . Anxiety   . Depression   . GERD (gastroesophageal reflux disease)   . Hypertension   . Wears contact lenses     Past Surgical History:  Procedure Laterality Date  . BREAST REDUCTION SURGERY    . COLONOSCOPY WITH PROPOFOL N/A 11/09/2016   Procedure: COLONOSCOPY WITH PROPOFOL;  Surgeon: [name] Minium, MD;  Location: [facility];  Service: Endoscopy;  Laterality: N/A;  . ESOPHAGOGASTRODUODENOSCOPY (EGD) WITH PROPOFOL N/A 11/09/2016   Procedure: ESOPHAGOGASTRODUODENOSCOPY (EGD) WITH PROPOFOL;  Surgeon: [name] Minium, MD;  Location: [facility];  Service: Endoscopy;  Laterality: N/A;  . TONSILLECTOMY         History of present illness and  Hospital Course:     Kindly see H&P for history of present illness and admission details, please review complete Labs, Consult reports and Test reports for all details in brief  HPI  from the history and physical done on the day of admission 37 year old female patient with history of essential hypertension, ADHD because of right upper quadrant pain, multiple episodes of vomiting, admitted  yesterday morning.  Patient had been drinking heavy amounts of alcohol on Friday.  Hospital Course  #1 abdominal pain secondary to alcoholic pancreatitis: Symptoms improved with IV hydration, conservative treatment, IV PPIs, IV nausea medicines, seen by gastroenterology, patient blood work including CBC, Chem-7, right upper quadrant ultrasound, CT renal stone protocol did not show any abnormality.  Patient nausea improved, advised her to quit drinking a lot.  Husband mentioned that she usually drinks on the weekend.  Discharge her in stable condition, give prescription for Carafate, Zofran, also given prescription for Flexeril to see if she has any muscle pain will take care of that. 2.  History of ADHD, anxiety, depression: Patient is on Adderall, Xanax.  Continue them. 3.  Essential hypertension: Controlled, patient can continue home dose HCTZ..  4.  Regarding right upper quadrant pain, nausea advised her to follow-up with PCP if symptoms recur patient may need  HIDA scan     Discharge Condition: Stable  Follow UP  Follow-up Information    Brenda Limerick, MD. Schedule an appointment as soon as possible for a visit in 1 week(s).   Specialty:  Family Medicine Contact information: [address] [place] [address] [phone]             Discharge Instructions  and  Discharge Medications    Allergies as of 03/10/2018      Reactions   Ace Inhibitors Swelling   Lips/ face      Medication List    STOP taking these medications   azithromycin 250 MG tablet Commonly known as:  ZITHROMAX     TAKE these medications   ALPRAZolam 1 MG tablet Commonly known as:  XANAX Take 1 mg by mouth at bedtime as needed for anxiety.   amphetamine-dextroamphetamine 20 MG tablet Commonly known as:  ADDERALL Take 20 mg by mouth 2 (two) times daily. What changed:  Another medication with the same name was removed. Continue taking this medication, and follow the directions you  see here.   cyclobenzaprine 5 MG tablet Commonly known as:  FLEXERIL Take 1 tablet (5 mg total) by mouth at bedtime.   hydrochlorothiazide 25 MG tablet Commonly known as:  HYDRODIURIL Take 1 tablet (25 mg total) by mouth daily.   meloxicam 15 MG tablet Commonly known as:  MOBIC Take 1 tablet (15 mg total) by mouth daily.   ondansetron 4 MG disintegrating tablet Commonly known as:  ZOFRAN-ODT Take 1 tablet (4 mg total) by mouth every 8 (eight) hours as needed for nausea or vomiting.   pantoprazole 40 MG tablet Commonly known as:  PROTONIX Take 1 tablet (40 mg total) by mouth daily. Take this in place of Dexilant   propranolol 10 MG tablet Commonly known as:  INDERAL TAKE 1 TABLET BY MOUTH DAILY AS NEEDED FOR PRESENTATION   sucralfate 1 GM/10ML suspension Commonly known as:  CARAFATE Take 10 mLs (1 g total) by mouth 4 (four) times daily -  with meals and at bedtime.   traZODone 50 MG tablet Commonly known as:  DESYREL Take 50 mg by mouth at bedtime. 1-3 tabs at bedtime Dr Evelene CroonKaur         Diet and Activity recommendation: See Discharge Instructions above   Consults obtained -gastroenterology   Major procedures and Radiology Reports - PLEASE review detailed and final reports for all details, in brief -      Dg Chest 2 View  Result Date: 03/09/2018 CLINICAL DATA:  Patient reports pain under right breast that radiates around into her back. Reports pain started shortly after eating garlic knots. Reports vomited x 1 EXAM: CHEST - 2 VIEW COMPARISON:  None. FINDINGS: Midline trachea.  Normal heart size and mediastinal contours. Sharp costophrenic angles.  No pneumothorax.  Clear lungs. Mild right hemidiaphragm elevation. Mild convex right thoracic spine curvature. IMPRESSION: No active cardiopulmonary disease. Electronically Signed   By: Jeronimo GreavesKyle  Talbot M.D.   On: 03/09/2018 20:27   Ct Renal Stone Study  Result Date: 03/09/2018 CLINICAL DATA:  Right upper quadrant pain. Nausea  and vomiting since 5 p.m. EXAM: CT ABDOMEN AND PELVIS WITHOUT CONTRAST TECHNIQUE: Multidetector CT imaging of the abdomen and pelvis was performed following the standard protocol without IV contrast. COMPARISON:  03/09/2018 right upper quadrant ultrasound. FINDINGS: Lower chest: Clear lung bases. Normal heart size without pericardial or pleural effusion. Azygos continuation of the IVC, with an enlarged azygos and hemi azygous veins. Hepatobiliary: Normal liver. Normal gallbladder, without biliary ductal dilatation. Pancreas: Normal, without mass or ductal dilatation. Spleen: Normal in size, without focal abnormality. Adrenals/Urinary Tract: Normal adrenal glands. No renal calculi or hydronephrosis. No hydroureter or ureteric calculi. No bladder calculi. Stomach/Bowel: Normal stomach, without wall thickening. Colonic stool burden suggests constipation. Normal terminal ileum and appendix. Normal small bowel. Vascular/Lymphatic: Normal caliber of the aorta and branch vessels. Azygos configuration of the IVC. No abdominopelvic adenopathy. Reproductive: Normal uterus and adnexa. Other: No significant free fluid. Musculoskeletal: No acute osseous abnormality. IMPRESSION: 1.  No urinary tract calculi or hydronephrosis. 2. Otherwise, low sensitivity exam secondary to stone study technique. 3.  Possible constipation. 4. Azygos continuation of the IVC. Electronically Signed   By: Jeronimo GreavesKyle  Talbot M.D.   On:  03/09/2018 23:56   US Abdomen Limited Ruq  Result Date: 03/09/2018 CLINICAL DATA:  Abdominal pain radiating to the back. EXAM: ULTRASOUND ABDOMEN LIMITED RIGHT UPPER QUADRANT COMPARISON:  None. FINDINGS: Gallbladder: Physiologically distended. No gallstones or wall thickening visualized. No pericholecystic fluid. No sonographic Murphy sign noted by sonographer. Common bile duct: Diameter: 5 mm. Liver: No focal lesion identified. Within normal limits in parenchymal echogenicity. Portal vein is patent on color Doppler  imaging with normal direction of blood flow towards the liver. IMPRESSION: Normal right upper quadrant ultrasound. Electronically Signed   By: Narda Rutherford M.D.   On: 03/09/2018 21:52    Micro Results     No results found for this or any previous visit (from the past 240 hour(s)).     Today   Subjective:   Brenda [name] today has no headache,no chest abdominal pain,no new weakness tingling or numbness, feels much better wants to go home today.   Objective:   Blood pressure (!) 144/66, pulse (!) 107, temperature 98.2 F (36.8 C), temperature source Oral, resp. rate 18, height 5\' 4"  (1.626 m), weight 98.4 kg, last menstrual period 02/13/2018, SpO2 100 %.   Intake/Output Summary (Last 24 hours) at 03/11/2018 1305 Last data filed at 03/10/2018 1400 Gross per 24 hour  Intake 100 ml  Output -  Net 100 ml    Exam Awake Alert, Oriented x 3, No new F.N deficits, Normal affect [place].AT,PERRAL Supple Neck,No JVD, No cervical lymphadenopathy appriciated.  Symmetrical Chest wall movement, Good air movement bilaterally, CTAB RRR,No Gallops,Rubs or new Murmurs, No Parasternal Heave +ve B.Sounds, Abd Soft, Non tender, No organomegaly appriciated, No rebound -guarding or rigidity. No Cyanosis, Clubbing or edema, No new Rash or bruise  Data Review   CBC w Diff:  Lab Results  Component Value Date   WBC 8.3 03/09/2018   HGB 14.1 03/09/2018   HGB 10.1 (L) 05/11/2013   HCT 40.5 03/09/2018   HCT 25.5 (L) 05/14/2013   PLT 342 03/09/2018   PLT 464 (H) 05/11/2013   LYMPHOPCT 46 04/13/2017   LYMPHOPCT 15.4 05/11/2013   MONOPCT 9 04/13/2017   MONOPCT 5.8 05/11/2013   EOSPCT 7 04/13/2017   EOSPCT 1.7 05/11/2013   BASOPCT 2 04/13/2017   BASOPCT 0.3 05/11/2013    CMP:  Lab Results  Component Value Date   NA 138 03/09/2018   NA 140 12/07/2014   K 3.5 03/09/2018   CL 106 03/09/2018   CO2 24 03/09/2018   BUN 10 03/09/2018   BUN 10 12/07/2014   CREATININE 0.62 03/09/2018    PROT 7.1 03/09/2018   ALBUMIN 3.7 03/09/2018   ALBUMIN 4.5 12/07/2014   BILITOT 0.3 03/09/2018   ALKPHOS 52 03/09/2018   AST 37 03/09/2018   ALT 43 03/09/2018  .   Total Time in preparing paper work, data evaluation and todays exam - 35 minutes  Katha Hamming M.D on 03/10/2018 at 1:05 PM    Note: This dictation was prepared with Dragon dictation along with smaller phrase technology. Any transcriptional errors that result from this process are unintentional.

## 2018-03-12 ENCOUNTER — Telehealth: Payer: Self-pay

## 2018-03-12 NOTE — Telephone Encounter (Signed)
TOC #2. [facility] At the time of this entry, pt still has not yet scheduled a hosp f/u w/ Dr. Yetta BarreJones. Discharge planning includes the following:  - f/u PCP 1 week - Change dosage of Adderall - Begin Zofran, Flexaril and Carafate - Stop Zithromax - Resume regular diet LVM requesting returned call.

## 2018-03-15 ENCOUNTER — Other Ambulatory Visit: Payer: Self-pay

## 2018-03-15 MED ORDER — PROPRANOLOL HCL 10 MG PO TABS: ORAL_TABLET | ORAL | 0 refills | Status: DC

## 2018-03-17 ENCOUNTER — Other Ambulatory Visit: Payer: Self-pay | Admitting: Family Medicine

## 2018-03-17 DIAGNOSIS — M5114 Intervertebral disc disorders with radiculopathy, thoracic region: Secondary | ICD-10-CM

## 2018-03-20 ENCOUNTER — Encounter: Payer: Self-pay | Admitting: Family Medicine

## 2018-03-20 ENCOUNTER — Ambulatory Visit: Payer: 59 | Admitting: Family Medicine

## 2018-03-20 VITALS — BP 122/82 | HR 68 | Ht 64.0 in | Wt 214.0 lb

## 2018-03-20 DIAGNOSIS — H1033 Unspecified acute conjunctivitis, bilateral: Secondary | ICD-10-CM

## 2018-03-20 DIAGNOSIS — Z23 Encounter for immunization: Secondary | ICD-10-CM | POA: Diagnosis not present

## 2018-03-20 MED ORDER — SULFACETAMIDE SODIUM 10 % OP SOLN: 1.0000 [drp] | OPHTHALMIC | 1 refills | Status: DC

## 2018-03-20 NOTE — Progress Notes (Signed)
Date:  03/20/2018   Name:  Brenda [name]   DOB:  Jan 20, 1981   MRN:  [id number]   Chief Complaint: Conjunctivitis (L) eye sensitive to light and red) Conjunctivitis   The current episode started 5 to 7 days ago. The onset was gradual. The problem occurs frequently. The problem has been gradually improving. The problem is mild. Nothing relieves the symptoms. Associated symptoms include eye itching, photophobia, congestion, eye discharge, eye pain and eye redness. Pertinent negatives include no fever, no decreased vision, no double vision, no ear discharge, no ear pain, no headaches, no hearing loss, no mouth sores, no rhinorrhea, no sore throat and no swollen glands. The eye pain is mild. There were no sick contacts.     Review of Systems  Constitutional: Negative for fever.  HENT: Positive for congestion. Negative for ear discharge, ear pain, hearing loss, mouth sores, rhinorrhea and sore throat.   Eyes: Positive for photophobia, pain, discharge, redness and itching. Negative for double vision.  Neurological: Negative for headaches.    Patient Active Problem List   Diagnosis Date Noted  . Epigastric abdominal pain 03/10/2018  . Abdominal pain   . Blood in stool   . Rectal polyp   . Heartburn   . Nausea   . Adult ADHD 12/07/2014    Allergies  Allergen Reactions  . Ace Inhibitors Swelling    Lips/ face    Past Surgical History:  Procedure Laterality Date  . BREAST REDUCTION SURGERY    . COLONOSCOPY WITH PROPOFOL N/A 11/09/2016   Procedure: COLONOSCOPY WITH PROPOFOL;  Surgeon: [name] Minium, MD;  Location: [facility];  Service: Endoscopy;  Laterality: N/A;  . ESOPHAGOGASTRODUODENOSCOPY (EGD) WITH PROPOFOL N/A 11/09/2016   Procedure: ESOPHAGOGASTRODUODENOSCOPY (EGD) WITH PROPOFOL;  Surgeon: [name] Minium, MD;  Location: [facility];  Service: Endoscopy;  Laterality: N/A;  . TONSILLECTOMY      Social History   Tobacco Use  . Smoking status: Never Smoker   . Smokeless tobacco: Never Used  Substance Use Topics  . Alcohol use: Yes    Alcohol/week: 10.0 standard drinks    Types: 5 Glasses of wine, 5 Cans of beer per week  . Drug use: No     Medication list has been reviewed and updated.  Current Meds  Medication Sig  . ALPRAZolam (XANAX) 1 MG tablet Take 1 mg by mouth at bedtime as needed for anxiety.  Marland Kitchen amphetamine-dextroamphetamine (ADDERALL) 20 MG tablet Take 20 mg by mouth 2 (two) times daily.  . cyclobenzaprine (FLEXERIL) 5 MG tablet Take 1 tablet (5 mg total) by mouth at bedtime.  . hydrochlorothiazide (HYDRODIURIL) 25 MG tablet Take 1 tablet (25 mg total) by mouth daily.  . meloxicam (MOBIC) 15 MG tablet TAKE 1 TABLET BY MOUTH DAILY  . ondansetron (ZOFRAN ODT) 4 MG disintegrating tablet Take 1 tablet (4 mg total) by mouth every 8 (eight) hours as needed for nausea or vomiting.  . pantoprazole (PROTONIX) 40 MG tablet Take 1 tablet (40 mg total) by mouth daily. Take this in place of Dexilant  . propranolol (INDERAL) 10 MG tablet TAKE 1 TABLET BY MOUTH DAILY AS NEEDED FOR PRESENTATION  . sucralfate (CARAFATE) 1 GM/10ML suspension Take 10 mLs (1 g total) by mouth 4 (four) times daily -  with meals and at bedtime.  . traZODone (DESYREL) 50 MG tablet Take 50 mg by mouth at bedtime. 1-3 tabs at bedtime Dr Evelene Croon    [facility] 2/9 Scores 02/18/2018 12/17/2015 06/25/2015  PHQ -  2 Score 0 0 0  PHQ- 9 Score 0 - -    Physical Exam  Constitutional: She is oriented to person, place, and time. She appears well-developed and well-nourished.  HENT:  Head: Normocephalic.  Right Ear: External ear normal.  Left Ear: External ear normal.  Mouth/Throat: Oropharynx is clear and moist.  Eyes: Pupils are equal, round, and reactive to light. Conjunctivae and EOM are normal. Lids are everted and swept, no foreign bodies found. Left eye exhibits no hordeolum. No foreign body present in the left eye. Right conjunctiva is not injected. Left conjunctiva is not  injected. No scleral icterus.  Neck: Normal range of motion. Neck supple. No JVD present. No tracheal deviation present. No thyromegaly present.  Cardiovascular: Normal rate, regular rhythm, normal heart sounds and intact distal pulses. Exam reveals no gallop and no friction rub.  No murmur heard. Pulmonary/Chest: Effort normal and breath sounds normal. No respiratory distress. She has no wheezes. She has no rales.  Abdominal: Soft. Bowel sounds are normal. She exhibits no mass. There is no hepatosplenomegaly. There is no tenderness. There is no rebound and no guarding.  Musculoskeletal: Normal range of motion. She exhibits no edema or tenderness.  Lymphadenopathy:    She has no cervical adenopathy.  Neurological: She is alert and oriented to person, place, and time. She has normal strength. She displays normal reflexes. No cranial nerve deficit.  Skin: Skin is warm. No rash noted.  Psychiatric: She has a normal mood and affect. Her mood appears not anxious. She does not exhibit a depressed mood.  Nursing note and vitals reviewed.   BP 122/82   Pulse 68   Ht 5\' 4"  (1.626 m)   Wt 214 lb (97.1 kg)   LMP 03/13/2018 (Exact Date)   BMI 36.73 kg/m   Assessment and Plan: 1. Acute bacterial conjunctivitis of both eyes Prescribe sulfa eye drops - sulfacetamide (BLEPH-10) 10 % ophthalmic solution; Place 1 drop into both eyes every 3 (three) hours.  Dispense: 15 mL; Refill: 1  2. Flu vaccine need administered - Flu Vaccine QUAD 6+ mos PF IM (Fluarix Quad PF)    Dr. Elizabeth Sauer [facility]  03/20/2018

## 2018-04-16 ENCOUNTER — Other Ambulatory Visit: Payer: Self-pay | Admitting: Family Medicine

## 2018-04-16 DIAGNOSIS — M5114 Intervertebral disc disorders with radiculopathy, thoracic region: Secondary | ICD-10-CM

## 2018-05-16 ENCOUNTER — Other Ambulatory Visit: Payer: Self-pay | Admitting: Family Medicine

## 2018-05-16 DIAGNOSIS — M5114 Intervertebral disc disorders with radiculopathy, thoracic region: Secondary | ICD-10-CM

## 2018-06-16 ENCOUNTER — Other Ambulatory Visit: Payer: Self-pay | Admitting: Family Medicine

## 2018-06-16 DIAGNOSIS — M5114 Intervertebral disc disorders with radiculopathy, thoracic region: Secondary | ICD-10-CM

## 2018-06-25 ENCOUNTER — Ambulatory Visit: Payer: 59 | Admitting: Family Medicine

## 2018-06-27 ENCOUNTER — Other Ambulatory Visit: Payer: Self-pay | Admitting: Family Medicine

## 2018-07-25 ENCOUNTER — Other Ambulatory Visit: Payer: Self-pay | Admitting: Family Medicine

## 2018-07-25 MED ORDER — PROPRANOLOL HCL 10 MG PO TABS: ORAL_TABLET | ORAL | 0 refills | Status: DC

## 2018-10-21 ENCOUNTER — Other Ambulatory Visit: Payer: Self-pay

## 2018-10-21 MED ORDER — PANTOPRAZOLE SODIUM 40 MG PO TBEC: DELAYED_RELEASE_TABLET | ORAL | 0 refills | Status: DC

## 2018-11-28 ENCOUNTER — Other Ambulatory Visit: Payer: Self-pay

## 2018-11-28 DIAGNOSIS — K219 Gastro-esophageal reflux disease without esophagitis: Secondary | ICD-10-CM

## 2018-11-28 MED ORDER — PANTOPRAZOLE SODIUM 40 MG PO TBEC: 40.0000 mg | DELAYED_RELEASE_TABLET | Freq: Every day | ORAL | 1 refills | Status: DC

## 2018-12-04 ENCOUNTER — Ambulatory Visit: Payer: 59 | Admitting: Family Medicine

## 2018-12-04 ENCOUNTER — Encounter: Payer: Self-pay | Admitting: Family Medicine

## 2018-12-04 ENCOUNTER — Other Ambulatory Visit: Payer: Self-pay

## 2018-12-04 VITALS — BP 132/76 | HR 72 | Ht 64.0 in | Wt 210.0 lb

## 2018-12-04 DIAGNOSIS — I10 Essential (primary) hypertension: Secondary | ICD-10-CM

## 2018-12-04 DIAGNOSIS — K219 Gastro-esophageal reflux disease without esophagitis: Secondary | ICD-10-CM

## 2018-12-04 MED ORDER — HYDROCHLOROTHIAZIDE 25 MG PO TABS: 25.0000 mg | ORAL_TABLET | Freq: Every day | ORAL | 1 refills | Status: DC

## 2018-12-04 MED ORDER — PANTOPRAZOLE SODIUM 40 MG PO TBEC: DELAYED_RELEASE_TABLET | ORAL | 1 refills | Status: DC

## 2018-12-04 NOTE — Progress Notes (Signed)
Date:  12/04/2018   Name:  Brenda [name]   DOB:  September 14, 1980   MRN:  [id number]   Chief Complaint: Hypertension and Gastroesophageal Reflux  Hypertension  This is a chronic problem. The current episode started more than 1 year ago. The problem is controlled. Pertinent negatives include no anxiety, blurred vision, chest pain, headaches, malaise/fatigue, neck pain, orthopnea, palpitations, peripheral edema, PND, shortness of breath or sweats. There are no associated agents to hypertension. There are no known risk factors for coronary artery disease. Past treatments include diuretics. The current treatment provides moderate improvement. There are no compliance problems.  There is no history of angina, kidney disease, CAD/MI, CVA, heart failure, left ventricular hypertrophy, PVD or retinopathy. There is no history of chronic renal disease, a hypertension causing med or renovascular disease.  Gastroesophageal Reflux  She reports no abdominal pain, no belching, no chest pain, no choking, no coughing, no dysphagia, no early satiety, no globus sensation, no heartburn, no hoarse voice, no nausea, no sore throat, no stridor, no tooth decay, no water brash or no wheezing. This is a new problem. The problem occurs constantly. The problem has been waxing and waning. The symptoms are aggravated by certain foods. Pertinent negatives include no anemia, fatigue, melena, muscle weakness, orthopnea or weight loss. She has tried a PPI for the symptoms. The treatment provided moderate relief.    Review of Systems  Constitutional: Negative for fatigue, malaise/fatigue and weight loss.  HENT: Negative for hoarse voice and sore throat.   Eyes: Negative for blurred vision.  Respiratory: Negative for cough, choking, shortness of breath and wheezing.   Cardiovascular: Negative for chest pain, palpitations, orthopnea and PND.  Gastrointestinal: Negative for abdominal pain, dysphagia, heartburn, melena and nausea.   Musculoskeletal: Negative for muscle weakness and neck pain.  Neurological: Negative for headaches.    Patient Active Problem List   Diagnosis Date Noted  . Epigastric abdominal pain 03/10/2018  . Abdominal pain   . Blood in stool   . Rectal polyp   . Heartburn   . Nausea   . Adult ADHD 12/07/2014    Allergies  Allergen Reactions  . Ace Inhibitors Swelling    Lips/ face    Past Surgical History:  Procedure Laterality Date  . BREAST REDUCTION SURGERY    . COLONOSCOPY WITH PROPOFOL N/A 11/09/2016   Procedure: COLONOSCOPY WITH PROPOFOL;  Surgeon: Lucilla Lame, MD;  Location: [place];  Service: Endoscopy;  Laterality: N/A;  . ESOPHAGOGASTRODUODENOSCOPY (EGD) WITH PROPOFOL N/A 11/09/2016   Procedure: ESOPHAGOGASTRODUODENOSCOPY (EGD) WITH PROPOFOL;  Surgeon: Lucilla Lame, MD;  Location: [place];  Service: Endoscopy;  Laterality: N/A;  . TONSILLECTOMY      Social History   Tobacco Use  . Smoking status: Never Smoker  . Smokeless tobacco: Never Used  Substance Use Topics  . Alcohol use: Yes    Alcohol/week: 10.0 standard drinks    Types: 5 Glasses of wine, 5 Cans of beer per week  . Drug use: No     Medication list has been reviewed and updated.  Current Meds  Medication Sig  . ALPRAZolam (XANAX) 1 MG tablet Take 1 mg by mouth at bedtime as needed for anxiety.  Marland Kitchen amphetamine-dextroamphetamine (ADDERALL) 20 MG tablet Take 20 mg by mouth 2 (two) times daily.  . hydrochlorothiazide (HYDRODIURIL) 25 MG tablet Take 1 tablet (25 mg total) by mouth daily.  . pantoprazole (PROTONIX) 40 MG tablet TAKE 1 TABLET BY MOUTH DAILY( TAKE IN PLACE  OF DEXILANT)  . propranolol (INDERAL) 10 MG tablet TAKE 1 TABLET BY MOUTH DAILY AS NEEDED FOR PRESENTATION  . traZODone (DESYREL) 50 MG tablet Take 50 mg by mouth at bedtime. 1-3 tabs at bedtime Dr Evelene CroonKaur    [facility] 12/04/2018 02/18/2018 12/17/2015 06/25/2015  PHQ - 2 Score 0 0 0 0  PHQ- 9 Score 0 0 - -    BP  Readings from Last 3 Encounters:  12/04/18 132/76  03/20/18 122/82  03/10/18 (!) 144/66    Physical Exam  Wt Readings from Last 3 Encounters:  12/04/18 210 lb (95.3 kg)  03/20/18 214 lb (97.1 kg)  03/10/18 216 lb 14.9 oz (98.4 kg)    BP 132/76   Pulse 72   Ht 5\' 4"  (1.626 m)   Wt 210 lb (95.3 kg)   LMP 11/15/2018 (Exact Date)   BMI 36.05 kg/m   Assessment and Plan:  1. Essential hypertension Chronic.  Controlled.  Continue hydrochlorothiazide 25 mg once a day.  Will recheck renal function panel.  Will recheck in 6 months. - hydrochlorothiazide (HYDRODIURIL) 25 MG tablet; Take 1 tablet (25 mg total) by mouth daily.  Dispense: 90 tablet; Refill: 1 - Renal function panel  2. Gastroesophageal reflux disease, esophagitis presence not specified Chronic.  Controlled.  Continue pantoprazole 40 mg once a day. - pantoprazole (PROTONIX) 40 MG tablet; TAKE 1 TABLET BY MOUTH DAILY( TAKE IN PLACE OF DEXILANT)  Dispense: 90 tablet; Refill: 1

## 2019-04-15 ENCOUNTER — Other Ambulatory Visit: Payer: Self-pay

## 2019-04-15 DIAGNOSIS — Z20822 Contact with and (suspected) exposure to covid-19: Secondary | ICD-10-CM

## 2019-04-16 LAB — NOVEL CORONAVIRUS, NAA: SARS-CoV-2, NAA: NOT DETECTED

## 2019-05-06 ENCOUNTER — Other Ambulatory Visit: Payer: Self-pay

## 2019-05-06 MED ORDER — PROPRANOLOL HCL 10 MG PO TABS: ORAL_TABLET | ORAL | 0 refills | Status: DC

## 2019-05-27 ENCOUNTER — Other Ambulatory Visit: Payer: Self-pay

## 2019-05-27 MED ORDER — PROPRANOLOL HCL 10 MG PO TABS: ORAL_TABLET | ORAL | 0 refills | Status: DC

## 2019-06-10 ENCOUNTER — Other Ambulatory Visit: Payer: Self-pay

## 2019-06-10 DIAGNOSIS — K219 Gastro-esophageal reflux disease without esophagitis: Secondary | ICD-10-CM

## 2019-06-10 DIAGNOSIS — I10 Essential (primary) hypertension: Secondary | ICD-10-CM

## 2019-06-10 MED ORDER — HYDROCHLOROTHIAZIDE 25 MG PO TABS: 25.0000 mg | ORAL_TABLET | Freq: Every day | ORAL | 0 refills | Status: DC

## 2019-06-10 MED ORDER — PANTOPRAZOLE SODIUM 40 MG PO TBEC: DELAYED_RELEASE_TABLET | ORAL | 0 refills | Status: DC

## 2019-06-10 NOTE — Progress Notes (Unsigned)
Sent in pantoprazole and HCTZ

## 2019-06-24 ENCOUNTER — Ambulatory Visit: Payer: 59 | Attending: Internal Medicine

## 2019-06-24 DIAGNOSIS — Z20822 Contact with and (suspected) exposure to covid-19: Secondary | ICD-10-CM

## 2019-06-25 LAB — NOVEL CORONAVIRUS, NAA: SARS-CoV-2, NAA: NOT DETECTED

## 2019-07-01 ENCOUNTER — Ambulatory Visit: Payer: Self-pay | Admitting: Family Medicine

## 2019-07-08 ENCOUNTER — Ambulatory Visit: Payer: Self-pay | Admitting: Family Medicine

## 2019-07-10 ENCOUNTER — Ambulatory Visit: Payer: 59 | Admitting: Family Medicine

## 2019-07-10 ENCOUNTER — Other Ambulatory Visit: Payer: Self-pay

## 2019-07-10 ENCOUNTER — Encounter: Payer: Self-pay | Admitting: Family Medicine

## 2019-07-10 VITALS — BP 130/98 | HR 80 | Ht 64.0 in | Wt 224.0 lb

## 2019-07-10 DIAGNOSIS — N3090 Cystitis, unspecified without hematuria: Secondary | ICD-10-CM

## 2019-07-10 DIAGNOSIS — I10 Essential (primary) hypertension: Secondary | ICD-10-CM | POA: Diagnosis not present

## 2019-07-10 DIAGNOSIS — F418 Other specified anxiety disorders: Secondary | ICD-10-CM

## 2019-07-10 DIAGNOSIS — Z23 Encounter for immunization: Secondary | ICD-10-CM | POA: Diagnosis not present

## 2019-07-10 DIAGNOSIS — K219 Gastro-esophageal reflux disease without esophagitis: Secondary | ICD-10-CM | POA: Insufficient documentation

## 2019-07-10 LAB — POCT URINALYSIS DIPSTICK
Bilirubin, UA: NEGATIVE
Glucose, UA: NEGATIVE
Ketones, UA: NEGATIVE
Leukocytes, UA: NEGATIVE
Nitrite, UA: NEGATIVE
Protein, UA: NEGATIVE
Spec Grav, UA: 1.015 (ref 1.010–1.025)
Urobilinogen, UA: 0.2 E.U./dL
pH, UA: 5 (ref 5.0–8.0)

## 2019-07-10 MED ORDER — PROPRANOLOL HCL 10 MG PO TABS: ORAL_TABLET | ORAL | 1 refills | Status: DC

## 2019-07-10 MED ORDER — HYDROCHLOROTHIAZIDE 25 MG PO TABS: 25.0000 mg | ORAL_TABLET | Freq: Every day | ORAL | 1 refills | Status: DC

## 2019-07-10 MED ORDER — PANTOPRAZOLE SODIUM 40 MG PO TBEC: DELAYED_RELEASE_TABLET | ORAL | 1 refills | Status: DC

## 2019-07-10 MED ORDER — CEPHALEXIN 500 MG PO CAPS: 500.0000 mg | ORAL_CAPSULE | Freq: Four times a day (QID) | ORAL | 0 refills | Status: DC

## 2019-07-10 NOTE — Progress Notes (Signed)
Date:  07/10/2019   Name:  Brenda [name]   DOB:  October 20, 1980   MRN:  [id number]   Chief Complaint: Hypertension, Gastroesophageal Reflux, Anxiety, Urinary Tract Infection (discomfort when urinates, cloudy urine x 1 day), and Flu Vaccine  Hypertension This is a chronic problem. The current episode started in the past 7 days. The problem is unchanged. The problem is controlled. Associated symptoms include anxiety. Pertinent negatives include no blurred vision, chest pain, headaches, malaise/fatigue, neck pain, orthopnea, palpitations, peripheral edema, PND, shortness of breath or sweats. There are no associated agents to hypertension. There are no known risk factors for coronary artery disease. Past treatments include diuretics. The current treatment provides moderate improvement. There are no compliance problems.  There is no history of angina, kidney disease, CAD/MI, CVA, heart failure, left ventricular hypertrophy, PVD or retinopathy. There is no history of chronic renal disease, a hypertension causing med or renovascular disease.  Gastroesophageal Reflux She reports no abdominal pain, no belching, no chest pain, no choking, no coughing, no dysphagia, no early satiety, no globus sensation, no heartburn, no hoarse voice, no nausea, no sore throat, no stridor or no wheezing. This is a chronic problem. The current episode started more than 1 year ago. The problem has been gradually improving. The symptoms are aggravated by certain foods. Pertinent negatives include no anemia, fatigue, melena, muscle weakness, orthopnea or weight loss. She has tried a PPI for the symptoms. The treatment provided mild relief. Past procedures do not include an abdominal ultrasound, an EGD, esophageal manometry, esophageal pH monitoring, H. pylori antibody titer or a UGI.  Anxiety Presents for follow-up visit. Symptoms include nervous/anxious behavior. Patient reports no chest pain, confusion, dizziness, excessive  worry, insomnia, nausea, palpitations or shortness of breath. The severity of symptoms is mild. The quality of sleep is good.    Urinary Tract Infection  This is a new problem. The current episode started yesterday. The problem occurs every urination. The problem has been gradually worsening. The quality of the pain is described as burning. Associated symptoms include frequency and urgency. Pertinent negatives include no chills, discharge, flank pain, hematuria, hesitancy, nausea, sweats or vomiting. Associated symptoms comments: Suprapubic discomfort. The treatment provided moderate relief.    Lab Results  Component Value Date   CREATININE 0.62 03/09/2018   BUN 10 03/09/2018   NA 138 03/09/2018   K 3.5 03/09/2018   CL 106 03/09/2018   CO2 24 03/09/2018   No results found for: CHOL, HDL, LDLCALC, LDLDIRECT, TRIG, CHOLHDL No results found for: TSH No results found for: HGBA1C   Review of Systems  Constitutional: Negative.  Negative for chills, fatigue, fever, malaise/fatigue, unexpected weight change and weight loss.  HENT: Negative for congestion, ear discharge, ear pain, hoarse voice, rhinorrhea, sinus pressure, sneezing and sore throat.   Eyes: Negative for blurred vision, photophobia, pain, discharge, redness and itching.  Respiratory: Negative for cough, choking, shortness of breath, wheezing and stridor.   Cardiovascular: Negative for chest pain, palpitations, orthopnea and PND.  Gastrointestinal: Negative for abdominal pain, blood in stool, constipation, diarrhea, dysphagia, heartburn, melena, nausea and vomiting.  Endocrine: Negative for cold intolerance, heat intolerance, polydipsia, polyphagia and polyuria.  Genitourinary: Positive for frequency and urgency. Negative for dysuria, flank pain, hematuria, hesitancy, menstrual problem, pelvic pain, vaginal bleeding and vaginal discharge.  Musculoskeletal: Negative for arthralgias, back pain, myalgias, muscle weakness and neck pain.   Skin: Negative for rash.  Allergic/Immunologic: Negative for environmental allergies and food allergies.  Neurological: Negative  for dizziness, weakness, light-headedness, numbness and headaches.  Hematological: Negative for adenopathy. Does not bruise/bleed easily.  Psychiatric/Behavioral: Negative for confusion and dysphoric mood. The patient is nervous/anxious. The patient does not have insomnia.     Patient Active Problem List   Diagnosis Date Noted  . Epigastric abdominal pain 03/10/2018  . Abdominal pain   . Blood in stool   . Rectal polyp   . Heartburn   . Nausea   . Adult ADHD 12/07/2014    Allergies  Allergen Reactions  . Ace Inhibitors Swelling    Lips/ face    Past Surgical History:  Procedure Laterality Date  . BREAST REDUCTION SURGERY    . COLONOSCOPY WITH PROPOFOL N/A 11/09/2016   Procedure: COLONOSCOPY WITH PROPOFOL;  Surgeon: [name] Minium, MD;  Location: [facility];  Service: Endoscopy;  Laterality: N/A;  . ESOPHAGOGASTRODUODENOSCOPY (EGD) WITH PROPOFOL N/A 11/09/2016   Procedure: ESOPHAGOGASTRODUODENOSCOPY (EGD) WITH PROPOFOL;  Surgeon: [name] Minium, MD;  Location: [facility];  Service: Endoscopy;  Laterality: N/A;  . TONSILLECTOMY      Social History   Tobacco Use  . Smoking status: Never Smoker  . Smokeless tobacco: Never Used  Substance Use Topics  . Alcohol use: Yes    Alcohol/week: 10.0 standard drinks    Types: 5 Glasses of wine, 5 Cans of beer per week  . Drug use: No     Medication list has been reviewed and updated.  Current Meds  Medication Sig  . ALPRAZolam (XANAX) 1 MG tablet Take 1 mg by mouth at bedtime as needed for anxiety.  Marland Kitchen amphetamine-dextroamphetamine (ADDERALL) 20 MG tablet Take 20 mg by mouth 2 (two) times daily.  . hydrochlorothiazide (HYDRODIURIL) 25 MG tablet Take 1 tablet (25 mg total) by mouth daily.  . pantoprazole (PROTONIX) 40 MG tablet TAKE 1 TABLET BY MOUTH DAILY( TAKE IN PLACE OF DEXILANT)  .  propranolol (INDERAL) 10 MG tablet TAKE 1 TABLET BY MOUTH DAILY AS NEEDED FOR PRESENTATION  . traZODone (DESYREL) 50 MG tablet Take 50 mg by mouth at bedtime. 1-3 tabs at bedtime Dr Evelene Croon    [facility] 2/9 Scores 07/10/2019 12/04/2018 02/18/2018 12/17/2015  PHQ - 2 Score 0 0 0 0  PHQ- 9 Score 0 0 0 -    BP Readings from Last 3 Encounters:  07/10/19 (!) 130/98  12/04/18 132/76  03/20/18 122/82    Physical Exam Vitals and nursing note reviewed.  Constitutional:      General: She is not in acute distress.    Appearance: She is not diaphoretic.  HENT:     Head: Normocephalic and atraumatic.     Right Ear: Tympanic membrane, ear canal and external ear normal.     Left Ear: Tympanic membrane, ear canal and external ear normal.     Nose: Nose normal. No congestion or rhinorrhea.     Mouth/Throat:     Mouth: Mucous membranes are moist.  Eyes:     General:        Right eye: No discharge.        Left eye: No discharge.     Conjunctiva/sclera: Conjunctivae normal.     Pupils: Pupils are equal, round, and reactive to light.  Neck:     Thyroid: No thyromegaly.     Vascular: No JVD.  Cardiovascular:     Rate and Rhythm: Normal rate and regular rhythm.     Heart sounds: Normal heart sounds. No murmur. No friction rub. No gallop.   Pulmonary:  Effort: Pulmonary effort is normal.     Breath sounds: Normal breath sounds. No wheezing, rhonchi or rales.  Abdominal:     General: Bowel sounds are normal.     Palpations: Abdomen is soft. There is no mass.     Tenderness: There is no abdominal tenderness. There is no guarding.  Musculoskeletal:        General: Normal range of motion.     Cervical back: Normal range of motion and neck supple.  Lymphadenopathy:     Cervical: No cervical adenopathy.  Skin:    General: Skin is warm and dry.     Capillary Refill: Capillary refill takes less than 2 seconds.  Neurological:     Mental Status: She is alert.     Deep Tendon Reflexes: Reflexes are normal  and symmetric.     Wt Readings from Last 3 Encounters:  07/10/19 224 lb (101.6 kg)  12/04/18 210 lb (95.3 kg)  03/20/18 214 lb (97.1 kg)    BP (!) 130/98   Pulse 80   Ht 5\' 4"  (1.626 m)   Wt 224 lb (101.6 kg)   LMP 06/29/2019 (Exact Date)   BMI 38.45 kg/m    Assessment and Plan: 1. Gastroesophageal reflux disease Chronic.  Controlled.  Stable.  Continue pantoprazole 40 mg once a day. - pantoprazole (PROTONIX) 40 MG tablet; TAKE 1 TABLET BY MOUTH DAILY( TAKE IN PLACE OF DEXILANT)  Dispense: 90 tablet; Refill: 1  2. Essential hypertension Chronic.  Controlled.  Stable.  Continue hydrochlorothiazide 25 mg once a day.  Will check renal function panel. - hydrochlorothiazide (HYDRODIURIL) 25 MG tablet; Take 1 tablet (25 mg total) by mouth daily.  Dispense: 90 tablet; Refill: 1 - Renal Function Panel  3. Performance anxiety Chronic.  Episodic.  Stable.  Will continue propranolol 10 mg 1 prior to presentation/speaking to a group situations. - propranolol (INDERAL) 10 MG tablet; TAKE 1 TABLET BY MOUTH DAILY AS NEEDED FOR PRESENTATION  Dispense: 20 tablet; Refill: 1  4. Cystitis Acute.  New onset.  Urinalysis unremarkable.  Suspect this is more of a urethritis and we will begin cephalexin 500 mg 4 times a day for 3 days. - cephALEXin (KEFLEX) 500 MG capsule; Take 1 capsule (500 mg total) by mouth 4 (four) times daily.  Dispense: 12 capsule; Refill: 0 - POCT urinalysis dipstick  5. Influenza vaccine needed Discussed and administered - Flu Vaccine QUAD 6+ mos PF IM (Fluarix Quad PF)

## 2019-07-10 NOTE — Patient Instructions (Signed)

## 2019-07-11 LAB — RENAL FUNCTION PANEL
Albumin: 5 g/dL — ABNORMAL HIGH (ref 3.8–4.8)
BUN/Creatinine Ratio: 18 (ref 9–23)
BUN: 15 mg/dL (ref 6–20)
CO2: 23 mmol/L (ref 20–29)
Calcium: 10.6 mg/dL — ABNORMAL HIGH (ref 8.7–10.2)
Chloride: 99 mmol/L (ref 96–106)
Creatinine, Ser: 0.82 mg/dL (ref 0.57–1.00)
GFR calc Af Amer: 105 mL/min/{1.73_m2} (ref >59)
GFR calc non Af Amer: 91 mL/min/{1.73_m2} (ref >59)
Glucose: 104 mg/dL — ABNORMAL HIGH (ref 65–99)
Phosphorus: 4.5 mg/dL — ABNORMAL HIGH (ref 3.0–4.3)
Potassium: 4.1 mmol/L (ref 3.5–5.2)
Sodium: 141 mmol/L (ref 134–144)

## 2019-07-21 ENCOUNTER — Ambulatory Visit: Payer: Self-pay | Admitting: Family Medicine

## 2019-08-06 ENCOUNTER — Other Ambulatory Visit: Payer: 59

## 2019-08-07 ENCOUNTER — Other Ambulatory Visit: Payer: Self-pay

## 2019-08-07 DIAGNOSIS — R110 Nausea: Secondary | ICD-10-CM

## 2019-08-07 MED ORDER — PROMETHAZINE HCL 25 MG PO TABS: 25.0000 mg | ORAL_TABLET | Freq: Three times a day (TID) | ORAL | 0 refills | Status: DC | PRN

## 2019-08-07 NOTE — Progress Notes (Unsigned)
Sent in promethazine [facility]

## 2019-08-21 ENCOUNTER — Ambulatory Visit: Payer: 59 | Attending: Internal Medicine

## 2019-08-21 DIAGNOSIS — Z23 Encounter for immunization: Secondary | ICD-10-CM

## 2019-08-21 NOTE — Progress Notes (Signed)
   Covid-19 Vaccination Clinic  Name:  Shirel Mallis    MRN: [id number] DOB: 11-May-1981  08/21/2019  Ms. [name] was observed post Covid-19 immunization for 15 minutes without incidence. She was provided with Vaccine Information Sheet and instruction to access the V-Safe system.   Ms. [name] was instructed to call 911 with any severe reactions post vaccine: Marland Kitchen Difficulty breathing  . Swelling of your face and throat  . A fast heartbeat  . A bad rash all over your body  . Dizziness and weakness    Immunizations Administered    Name Date Dose VIS Date Route   Pfizer COVID-19 Vaccine 08/21/2019 12:06 PM 0.3 mL 06/06/2019 Intramuscular   Manufacturer: [employer], [employer]   Lot: [id number]   NDC: [address]-3

## 2019-09-17 ENCOUNTER — Ambulatory Visit: Payer: 59 | Attending: Internal Medicine

## 2019-09-17 DIAGNOSIS — Z23 Encounter for immunization: Secondary | ICD-10-CM

## 2019-09-17 NOTE — Progress Notes (Signed)
   Covid-19 Vaccination Clinic  Name:  Brenda [name]    MRN: [id number] DOB: 1981/06/01  09/17/2019  Ms. Brenda [name] was observed post Covid-19 immunization for 15 minutes without incident. She was provided with Vaccine Information Sheet and instruction to access the V-Safe system.   Ms. Brenda [name] was instructed to call 911 with any severe reactions post vaccine: Marland Kitchen Difficulty breathing  . Swelling of face and throat  . A fast heartbeat  . A bad rash all over body  . Dizziness and weakness   Immunizations Administered    Name Date Dose VIS Date Route   Pfizer COVID-19 Vaccine 09/17/2019 11:43 AM 0.3 mL 06/06/2019 Intramuscular   Manufacturer: [employer], [employer]   Lot: [id number]   NDC: [address]-4

## 2019-10-12 ENCOUNTER — Encounter: Payer: Self-pay | Admitting: Emergency Medicine

## 2019-10-12 ENCOUNTER — Ambulatory Visit (INDEPENDENT_AMBULATORY_CARE_PROVIDER_SITE_OTHER): Payer: 59

## 2019-10-12 ENCOUNTER — Ambulatory Visit
Admission: EM | Admit: 2019-10-12 | Discharge: 2019-10-12 | Disposition: A | Discharge status: Home / Self Care | Payer: 59 | Attending: Family Medicine | Admitting: Family Medicine

## 2019-10-12 ENCOUNTER — Other Ambulatory Visit: Payer: Self-pay

## 2019-10-12 DIAGNOSIS — M25571 Pain in right ankle and joints of right foot: Secondary | ICD-10-CM

## 2019-10-12 DIAGNOSIS — S93401A Sprain of unspecified ligament of right ankle, initial encounter: Secondary | ICD-10-CM

## 2019-10-12 DIAGNOSIS — W19XXXA Unspecified fall, initial encounter: Secondary | ICD-10-CM | POA: Diagnosis not present

## 2019-10-12 IMAGING — CR DG ANKLE COMPLETE 3+V*R*
3 series · 3 of 3 positions shown · non-contrast
Comparison: None.

CLINICAL DATA: Fall, twisted ankle, swelling and pain.

EXAM:
RIGHT ANKLE - COMPLETE 3+ VIEW

[ankle ap]
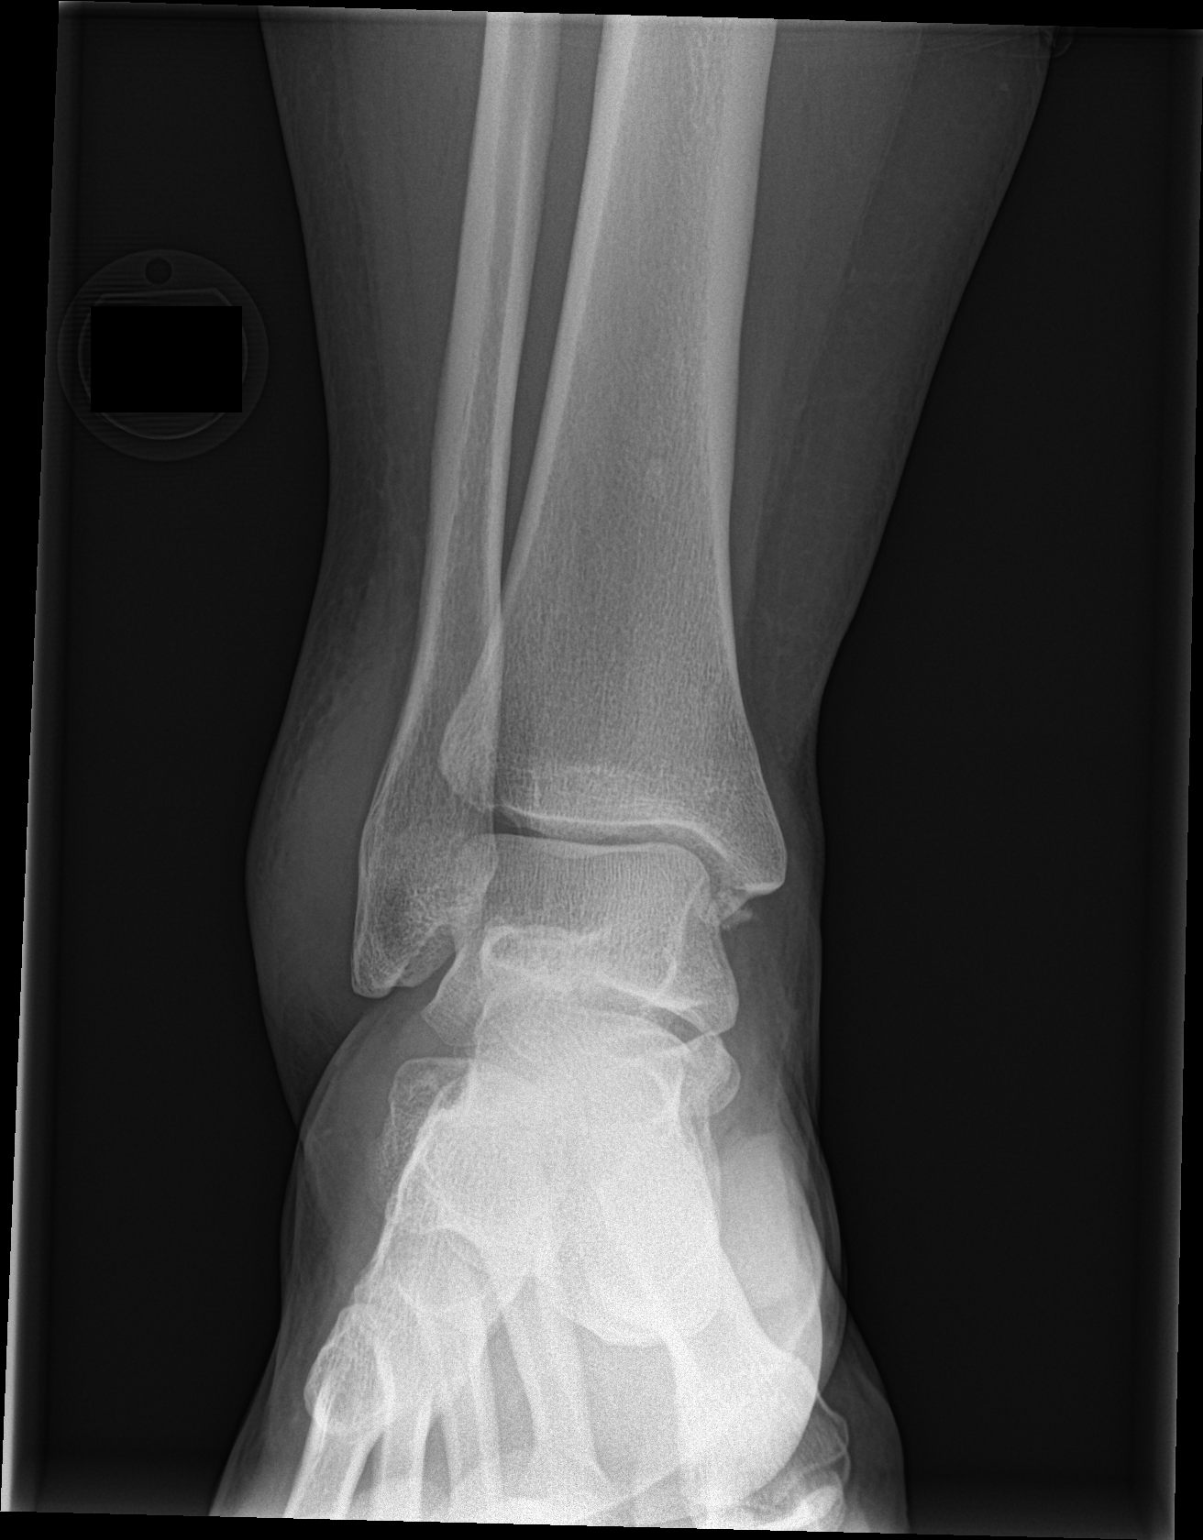

[ankle obl]
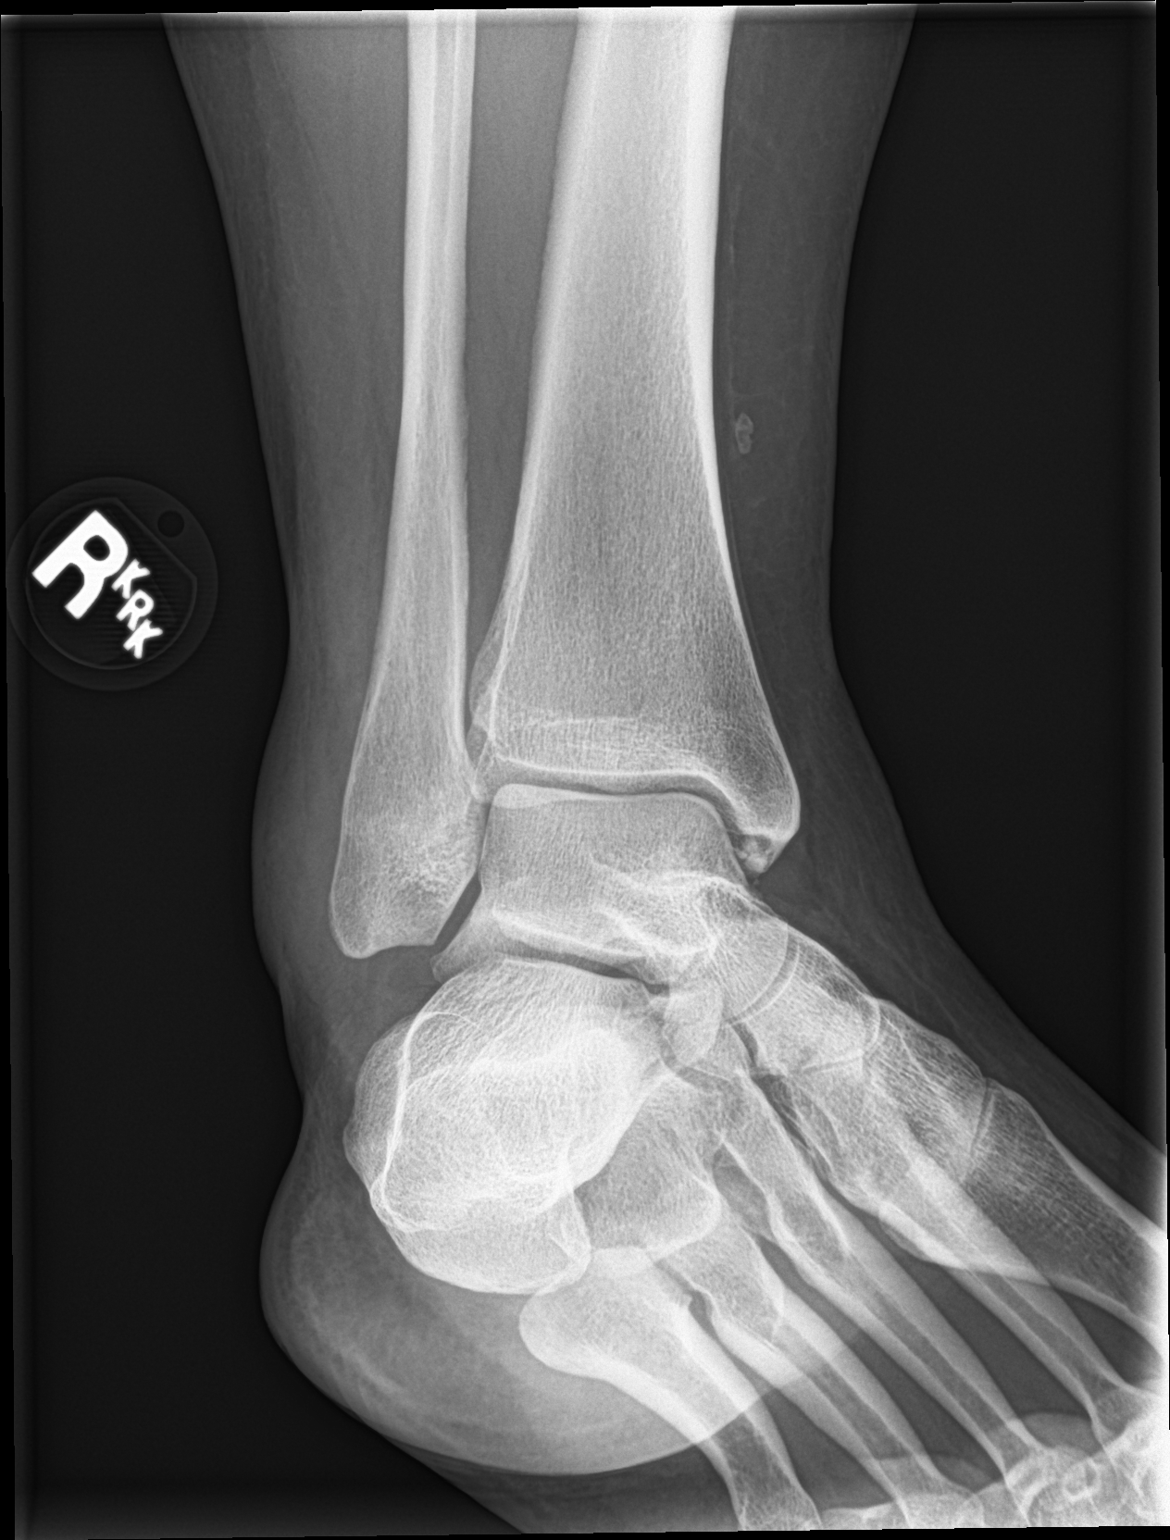

[ankle lat]
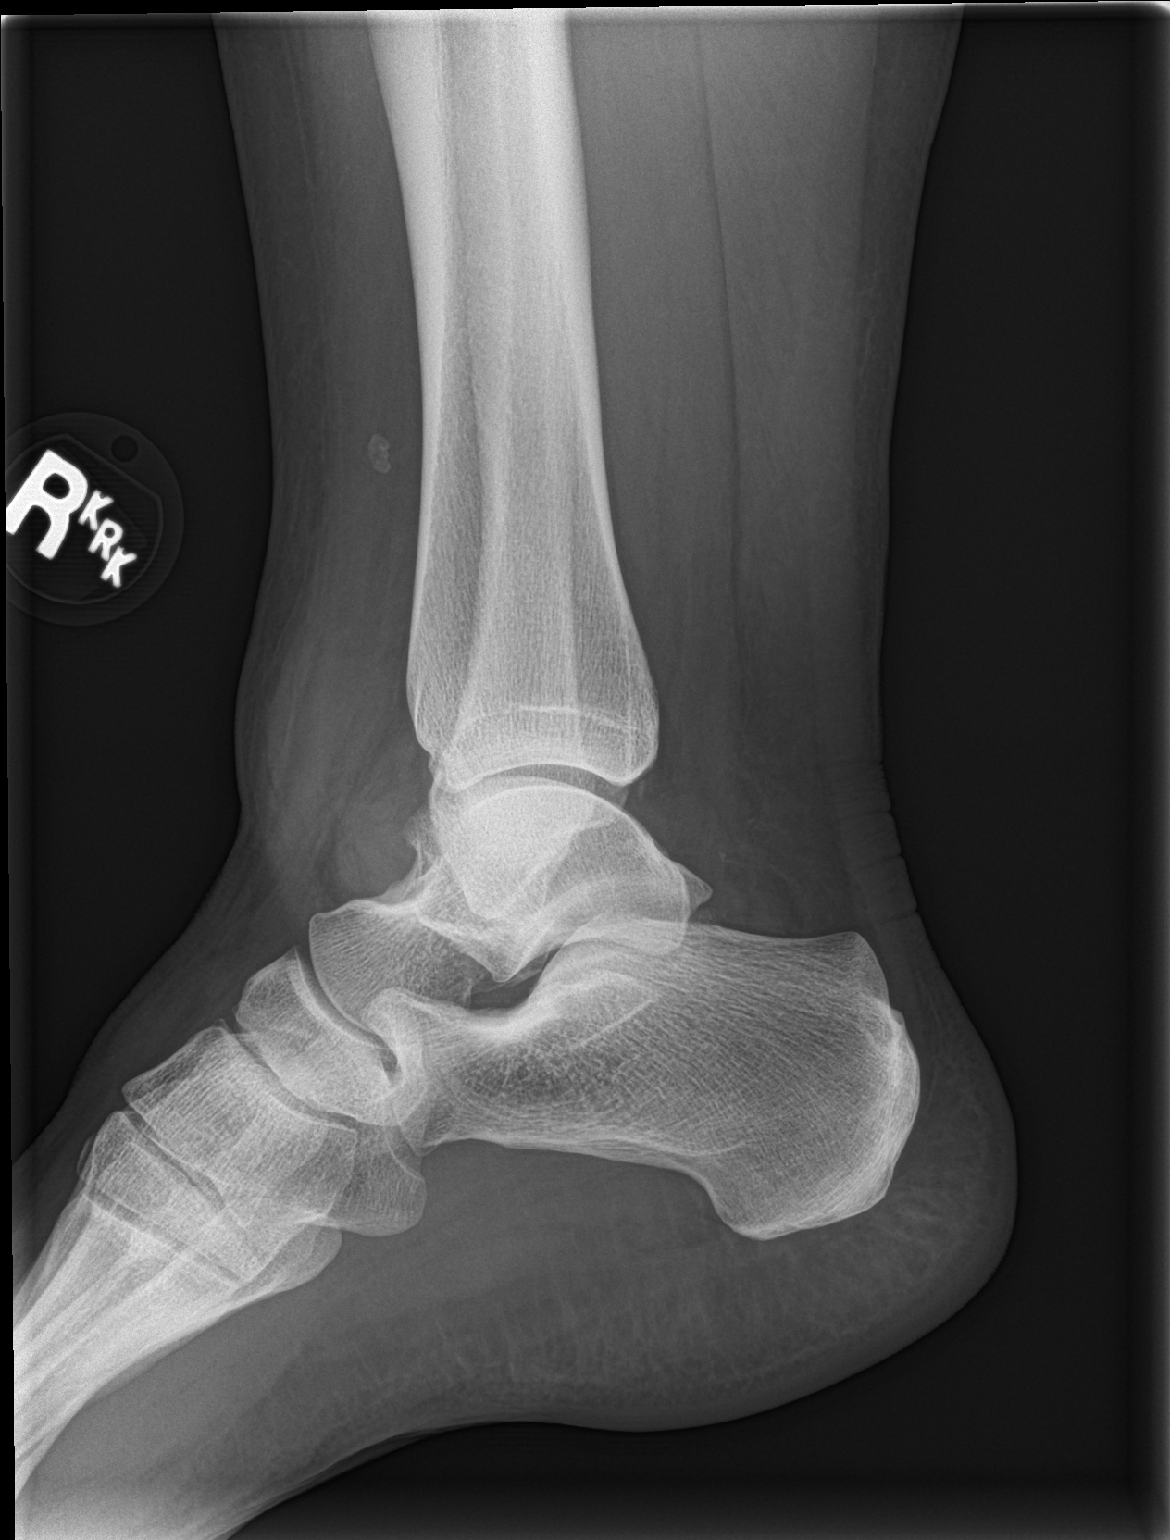

[3 of 3 positions shown; findings below may reference images not displayed]

FINDINGS: Osseous alignment is normal. Ankle mortise is symmetric. No fracture
line. Small chronic-appearing calcifications underlying the medial
malleolus, likely sequela of previous injury. Visualized portions of
the hindfoot and midfoot appear intact and normally aligned.

Prominent soft tissue swelling overlying the lateral malleolus.
Probable joint effusion.
IMPRESSION: 1. Prominent soft tissue swelling overlying the lateral malleolus.
2. No evidence of acute osseous fracture or dislocation.
3. Probable joint effusion.

## 2019-10-12 MED ORDER — MELOXICAM 15 MG PO TABS: 15.0000 mg | ORAL_TABLET | Freq: Every day | ORAL | 0 refills | Status: DC | PRN

## 2019-10-12 NOTE — Discharge Instructions (Signed)
Rest, ice, elevation.  Medication as prescribed.  Take care  Dr. Vivica Dobosz  

## 2019-10-12 NOTE — ED Provider Notes (Signed)
[facility]    CSN: [id number] Arrival date & time: 10/12/19  1121   History   Chief Complaint Chief Complaint  Patient presents with  . Ankle Injury    DOI 10/12/19  . Ankle Pain    right   HPI   39 year old female presents with the above complaints.  Patient reports that she injured her ankle earlier today.  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  She reports right ankle pain and swelling.  Patient reports her pain is 7/10 in severity.  Described as an aching sensation.  She is having difficulty ambulating as a result.  She has iced the area.  No relieving factors.  No other complaints.  Past Medical History:  Diagnosis Date  . ADHD, adult residual type   . Anxiety   . Depression   . GERD (gastroesophageal reflux disease)   . Hypertension   . Wears contact lenses    Patient Active Problem List   Diagnosis Date Noted  . Gastroesophageal reflux disease 07/10/2019  . Essential hypertension 07/10/2019  . Performance anxiety 07/10/2019  . Epigastric abdominal pain 03/10/2018  . Abdominal pain   . Blood in stool   . Rectal polyp   . Heartburn   . Nausea   . Adult ADHD 12/07/2014   Past Surgical History:  Procedure Laterality Date  . BREAST REDUCTION SURGERY    . COLONOSCOPY WITH PROPOFOL N/A 11/09/2016   Procedure: COLONOSCOPY WITH PROPOFOL;  Surgeon: [name] Minium, MD;  Location: [facility];  Service: Endoscopy;  Laterality: N/A;  . ESOPHAGOGASTRODUODENOSCOPY (EGD) WITH PROPOFOL N/A 11/09/2016   Procedure: ESOPHAGOGASTRODUODENOSCOPY (EGD) WITH PROPOFOL;  Surgeon: [name] Minium, MD;  Location: [facility];  Service: Endoscopy;  Laterality: N/A;  . TONSILLECTOMY     OB History    Gravida  2   Para  1   Term  1   Preterm      AB  1   Living  1     SAB      TAB      Ectopic      Multiple      Live Births  1          Home  Medications    Prior to Admission medications   Medication Sig Start Date End Date Taking? Authorizing Provider  ALPRAZolam Prudy Feeler) 1 MG tablet Take 1 mg by mouth at bedtime as needed for anxiety.   Yes [provider]  amphetamine-dextroamphetamine (ADDERALL) 20 MG tablet Take 20 mg by mouth 2 (two) times daily. 02/21/18  Yes [provider]  hydrochlorothiazide (HYDRODIURIL) 25 MG tablet Take 1 tablet (25 mg total) by mouth daily. 07/10/19  Yes Duanne Limerick, MD  pantoprazole (PROTONIX) 40 MG tablet TAKE 1 TABLET BY MOUTH DAILY( TAKE IN PLACE OF DEXILANT) 07/10/19  Yes Duanne Limerick, MD  propranolol (INDERAL) 10 MG tablet TAKE 1 TABLET BY MOUTH DAILY AS NEEDED FOR PRESENTATION 07/10/19  Yes Duanne Limerick, MD  traZODone (DESYREL) 50 MG tablet Take 50 mg by mouth at bedtime. 1-3 tabs at bedtime Dr Evelene Croon   Yes [provider]  meloxicam (MOBIC) 15 MG tablet Take 1 tablet (15 mg total) by mouth daily as needed for pain. 10/12/19   Tommie Sams, DO  promethazine (PHENERGAN) 25 MG tablet Take 1 tablet (25 mg total)  by mouth every 8 (eight) hours as needed for nausea or vomiting. 08/07/19 10/12/19  Duanne Limerick, MD    Family History Family History  Problem Relation Age of Onset  . Heart disease Maternal Grandmother   . Heart disease Maternal Grandfather   . Diabetes Paternal Grandfather   . Hypertension Mother   . Hypertension Father     Social History Social History   Tobacco Use  . Smoking status: Never Smoker  . Smokeless tobacco: Never Used  Substance Use Topics  . Alcohol use: Yes    Alcohol/week: 10.0 standard drinks    Types: 5 Glasses of wine, 5 Cans of beer per week  . Drug use: No     Allergies   Ace inhibitors   Review of Systems Review of Systems  Constitutional: Negative.   Musculoskeletal:       Right ankle pain and swelling.   Physical Exam Triage Vital Signs ED Triage Vitals  Enc Vitals Group     BP 10/12/19 1229 (!) 151/109       Pulse Rate 10/12/19 1229 (!) 109     Resp 10/12/19 1229 18     Temp 10/12/19 1229 98.2 F (36.8 C)     Temp Source 10/12/19 1229 Oral     SpO2 10/12/19 1229 100 %     Weight 10/12/19 1231 220 lb (99.8 kg)     Height 10/12/19 1231 5\' 4"  (1.626 m)     Head Circumference --      Peak Flow --      Pain Score 10/12/19 1230 7     Pain Loc --      Pain Edu? --      Excl. in GC? --    Updated Vital Signs BP (!) 151/109 (BP Location: Left Arm)   Pulse (!) 109   Temp 98.2 F (36.8 C) (Oral)   Resp 18   Ht 5\' 4"  (1.626 m)   Wt 99.8 kg   LMP 10/01/2019 (Exact Date)   SpO2 100%   BMI 37.76 kg/m   Visual Acuity Right Eye Distance:   Left Eye Distance:   Bilateral Distance:    Right Eye Near:   Left Eye Near:    Bilateral Near:     Physical Exam Vitals and nursing note reviewed.  Constitutional:      General: She is not in acute distress.    Appearance: Normal appearance. She is not ill-appearing.  HENT:     Head: Normocephalic and atraumatic.  Eyes:     General:        Right eye: No discharge.        Left eye: No discharge.     Conjunctiva/sclera: Conjunctivae normal.  Pulmonary:     Effort: Pulmonary effort is normal. No respiratory distress.  Musculoskeletal:     Comments: Right ankle -swelling over the lateral malleolus.  Tender to palpation.  Neurological:     Mental Status: She is alert.  Psychiatric:        Mood and Affect: Mood normal.        Behavior: Behavior normal.    UC Treatments / Results  Labs (all labs ordered are listed, but only abnormal results are displayed) Labs Reviewed - No data to display  EKG   Radiology DG Ankle Complete Right  Result Date: 10/12/2019 CLINICAL DATA:  Fall, twisted ankle, swelling and pain. EXAM: RIGHT ANKLE - COMPLETE 3+ VIEW COMPARISON:  None. FINDINGS: Osseous alignment is normal. Ankle mortise  is symmetric. No fracture line. Small chronic-appearing calcifications underlying the medial malleolus, likely sequela  of previous injury. Visualized portions of the hindfoot and midfoot appear intact and normally aligned. Prominent soft tissue swelling overlying the lateral malleolus. Probable joint effusion. IMPRESSION: 1. Prominent soft tissue swelling overlying the lateral malleolus. 2. No evidence of acute osseous fracture or dislocation. 3. Probable joint effusion. Electronically Signed   By: Franki Cabot M.D.   On: 10/12/2019 13:08    Procedures Procedures (including critical care time)  Medications Ordered in UC Medications - No data to display  Initial Impression / Assessment and Plan / UC Course  I have reviewed the triage vital signs and the nursing notes.  Pertinent labs & imaging results that were available during my care of the patient were reviewed by me and considered in my medical decision making (see chart for details).    39 year old female presents with acute right ankle sprain.  X-ray obtained due to exam findings.  X-ray reviewed and interpreted independently by me.  Interpretation: Lateral soft tissue swelling.  Calcifications noted along the medial malleolus.  No acute fracture.     Advised rest, ice, elevation.  Patient placed in a cam walker for comfort.  Meloxicam as directed.  Final Clinical Impressions(s) / UC Diagnoses   Final diagnoses:  Sprain of right ankle, unspecified ligament, initial encounter     Discharge Instructions     Rest, ice, elevation.  Medication as prescribed.  Take care  Dr. Lacinda Axon     ED Prescriptions    Medication Sig Dispense Auth. Provider   meloxicam (MOBIC) 15 MG tablet Take 1 tablet (15 mg total) by mouth daily as needed for pain. 30 tablet Coral Spikes, DO     PDMP not reviewed this encounter.   Coral Spikes, DO 10/12/19 1455

## 2019-10-12 NOTE — ED Triage Notes (Signed)
Patient in today c/o right ankle injury today. 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

## 2019-10-13 ENCOUNTER — Encounter: Payer: Self-pay | Admitting: Family Medicine

## 2019-10-13 ENCOUNTER — Other Ambulatory Visit: Payer: Self-pay

## 2019-10-13 ENCOUNTER — Ambulatory Visit: Payer: 59 | Admitting: Family Medicine

## 2019-10-13 VITALS — BP 130/80 | HR 64 | Ht 64.0 in | Wt 224.0 lb

## 2019-10-13 DIAGNOSIS — F418 Other specified anxiety disorders: Secondary | ICD-10-CM

## 2019-10-13 DIAGNOSIS — S93421D Sprain of deltoid ligament of right ankle, subsequent encounter: Secondary | ICD-10-CM | POA: Diagnosis not present

## 2019-10-13 DIAGNOSIS — K219 Gastro-esophageal reflux disease without esophagitis: Secondary | ICD-10-CM

## 2019-10-13 DIAGNOSIS — I10 Essential (primary) hypertension: Secondary | ICD-10-CM

## 2019-10-13 MED ORDER — TRAMADOL HCL 50 MG PO TABS: 50.0000 mg | ORAL_TABLET | Freq: Three times a day (TID) | ORAL | 0 refills | Status: AC | PRN

## 2019-10-13 MED ORDER — PROPRANOLOL HCL 10 MG PO TABS: ORAL_TABLET | ORAL | 5 refills | Status: DC

## 2019-10-13 MED ORDER — PANTOPRAZOLE SODIUM 40 MG PO TBEC: DELAYED_RELEASE_TABLET | ORAL | 1 refills | Status: DC

## 2019-10-13 MED ORDER — HYDROCHLOROTHIAZIDE 25 MG PO TABS: 25.0000 mg | ORAL_TABLET | Freq: Every day | ORAL | 1 refills | Status: DC

## 2019-10-13 NOTE — Progress Notes (Signed)
Date:  10/13/2019   Name:  Brenda [name]   DOB:  05/24/1981   MRN:  [id number]   Chief Complaint: Gastroesophageal Reflux, Hypertension, and Anxiety (test anxiety)  Gastroesophageal Reflux She reports no abdominal pain, no belching, no chest pain, no choking, no coughing, no dysphagia, no early satiety, no globus sensation, no heartburn, no hoarse voice, no nausea, no sore throat, no stridor, no tooth decay, no water brash or no wheezing. This is a chronic problem. The current episode started more than 1 year ago. The problem occurs occasionally. The problem has been gradually improving. The symptoms are aggravated by certain foods. Pertinent negatives include no anemia, fatigue, melena, muscle weakness, orthopnea or weight loss. There are no known risk factors. She has tried a PPI for the symptoms. The treatment provided moderate relief. Past procedures do not include an abdominal ultrasound, an EGD, esophageal manometry, esophageal pH monitoring, H. pylori antibody titer or a UGI.  Hypertension This is a chronic problem. The current episode started more than 1 year ago. The problem has been waxing and waning since onset. The problem is controlled. Associated symptoms include anxiety. Pertinent negatives include no blurred vision, chest pain, headaches, malaise/fatigue, neck pain, orthopnea, palpitations, peripheral edema, PND, shortness of breath or sweats. There are no associated agents to hypertension. There are no known risk factors for coronary artery disease. Past treatments include diuretics. The current treatment provides moderate improvement. There are no compliance problems.  There is no history of angina, kidney disease, CAD/MI, CVA, heart failure, left ventricular hypertrophy or PVD. There is no history of chronic renal disease, a hypertension causing med or renovascular disease.  Anxiety Presents for follow-up visit. Symptoms include nervous/anxious behavior. Patient reports no  chest pain, compulsions, confusion, decreased concentration, depressed mood, dizziness, dry mouth, excessive worry, feeling of choking, hyperventilation, impotence, insomnia, irritability, malaise, muscle tension, nausea, obsessions, palpitations, panic, restlessness, shortness of breath or suicidal ideas. Symptoms occur occasionally. The severity of symptoms is mild. The quality of sleep is good.      Lab Results  Component Value Date   CREATININE 0.82 07/10/2019   BUN 15 07/10/2019   NA 141 07/10/2019   K 4.1 07/10/2019   CL 99 07/10/2019   CO2 23 07/10/2019   No results found for: CHOL, HDL, LDLCALC, LDLDIRECT, TRIG, CHOLHDL No results found for: TSH No results found for: HGBA1C Lab Results  Component Value Date   WBC 8.3 03/09/2018   HGB 14.1 03/09/2018   HCT 40.5 03/09/2018   MCV 89.5 03/09/2018   PLT 342 03/09/2018   Lab Results  Component Value Date   ALT 43 03/09/2018   AST 37 03/09/2018   ALKPHOS 52 03/09/2018   BILITOT 0.3 03/09/2018     Review of Systems  Constitutional: Negative.  Negative for chills, fatigue, fever, irritability, malaise/fatigue, unexpected weight change and weight loss.  HENT: Negative for congestion, ear discharge, ear pain, hoarse voice, rhinorrhea, sinus pressure, sneezing and sore throat.   Eyes: Negative for blurred vision, photophobia, pain, discharge, redness and itching.  Respiratory: Negative for cough, choking, shortness of breath, wheezing and stridor.   Cardiovascular: Negative for chest pain, palpitations, orthopnea and PND.  Gastrointestinal: Negative for abdominal pain, blood in stool, constipation, diarrhea, dysphagia, heartburn, melena, nausea and vomiting.  Endocrine: Negative for cold intolerance, heat intolerance, polydipsia, polyphagia and polyuria.  Genitourinary: Negative for dysuria, flank pain, frequency, hematuria, impotence, menstrual problem, pelvic pain, urgency, vaginal bleeding and vaginal discharge.    Musculoskeletal:  Negative for arthralgias, back pain, myalgias, muscle weakness and neck pain.  Skin: Negative for rash.  Allergic/Immunologic: Negative for environmental allergies and food allergies.  Neurological: Negative for dizziness, weakness, light-headedness, numbness and headaches.  Hematological: Negative for adenopathy. Does not bruise/bleed easily.  Psychiatric/Behavioral: Negative for confusion, decreased concentration, dysphoric mood and suicidal ideas. The patient is nervous/anxious. The patient does not have insomnia.     Patient Active Problem List   Diagnosis Date Noted  . Gastroesophageal reflux disease 07/10/2019  . Essential hypertension 07/10/2019  . Performance anxiety 07/10/2019  . Epigastric abdominal pain 03/10/2018  . Abdominal pain   . Blood in stool   . Rectal polyp   . Heartburn   . Nausea   . Adult ADHD 12/07/2014    Allergies  Allergen Reactions  . Ace Inhibitors Swelling    Lips/ face    Past Surgical History:  Procedure Laterality Date  . BREAST REDUCTION SURGERY    . COLONOSCOPY WITH PROPOFOL N/A 11/09/2016   Procedure: COLONOSCOPY WITH PROPOFOL;  Surgeon: [name] Minium, MD;  Location: [facility];  Service: Endoscopy;  Laterality: N/A;  . ESOPHAGOGASTRODUODENOSCOPY (EGD) WITH PROPOFOL N/A 11/09/2016   Procedure: ESOPHAGOGASTRODUODENOSCOPY (EGD) WITH PROPOFOL;  Surgeon: [name] Minium, MD;  Location: [facility];  Service: Endoscopy;  Laterality: N/A;  . TONSILLECTOMY      Social History   Tobacco Use  . Smoking status: Never Smoker  . Smokeless tobacco: Never Used  Substance Use Topics  . Alcohol use: Yes    Alcohol/week: 10.0 standard drinks    Types: 5 Glasses of wine, 5 Cans of beer per week  . Drug use: No     Medication list has been reviewed and updated.  Current Meds  Medication Sig  . ALPRAZolam (XANAX) 1 MG tablet Take 1 mg by mouth at bedtime as needed for anxiety. psych  . amphetamine-dextroamphetamine  (ADDERALL) 20 MG tablet Take 20 mg by mouth 2 (two) times daily. psych  . hydrochlorothiazide (HYDRODIURIL) 25 MG tablet Take 1 tablet (25 mg total) by mouth daily.  . meloxicam (MOBIC) 15 MG tablet Take 1 tablet (15 mg total) by mouth daily as needed for pain.  . pantoprazole (PROTONIX) 40 MG tablet TAKE 1 TABLET BY MOUTH DAILY( TAKE IN PLACE OF DEXILANT)  . propranolol (INDERAL) 10 MG tablet TAKE 1 TABLET BY MOUTH DAILY AS NEEDED FOR PRESENTATION  . traZODone (DESYREL) 50 MG tablet Take 50 mg by mouth at bedtime. 1-3 tabs at bedtime Dr Evelene Croon    [facility] 2/9 Scores 10/13/2019 07/10/2019 12/04/2018 02/18/2018  PHQ - 2 Score 0 0 0 0  PHQ- 9 Score 0 0 0 0    BP Readings from Last 3 Encounters:  10/13/19 130/80  10/12/19 (!) 151/109  07/10/19 (!) 130/98    Physical Exam Vitals and nursing note reviewed.  Constitutional:      Appearance: She is well-developed and normal weight.  HENT:     Head: Normocephalic.     Right Ear: Tympanic membrane, ear canal and external ear normal.     Left Ear: Tympanic membrane, ear canal and external ear normal.     Nose: Nose normal.     Mouth/Throat:     Mouth: Mucous membranes are moist.  Eyes:     General: Lids are everted, no foreign bodies appreciated. No scleral icterus.       Left eye: No foreign body or hordeolum.     Conjunctiva/sclera: Conjunctivae normal.     Right  eye: Right conjunctiva is not injected.     Left eye: Left conjunctiva is not injected.     Pupils: Pupils are equal, round, and reactive to light.  Neck:     Thyroid: No thyromegaly.     Vascular: No JVD.     Trachea: No tracheal deviation.  Cardiovascular:     Rate and Rhythm: Normal rate and regular rhythm.     Heart sounds: Normal heart sounds. No murmur. No friction rub. No gallop.   Pulmonary:     Effort: Pulmonary effort is normal. No respiratory distress.     Breath sounds: Normal breath sounds. No wheezing, rhonchi or rales.  Abdominal:     General: Bowel sounds are  normal.     Palpations: Abdomen is soft. There is no mass.     Tenderness: There is no abdominal tenderness. There is no guarding or rebound.  Musculoskeletal:        General: No tenderness. Normal range of motion.     Cervical back: Normal range of motion and neck supple.  Lymphadenopathy:     Cervical: No cervical adenopathy.  Skin:    General: Skin is warm.     Findings: No rash.  Neurological:     Mental Status: She is alert and oriented to person, place, and time.     Cranial Nerves: No cranial nerve deficit.     Deep Tendon Reflexes: Reflexes normal.  Psychiatric:        Mood and Affect: Mood is not anxious or depressed.     Wt Readings from Last 3 Encounters:  10/13/19 224 lb (101.6 kg)  10/12/19 220 lb (99.8 kg)  07/10/19 224 lb (101.6 kg)    BP 130/80   Pulse 64   Ht 5\' 4"  (1.626 m)   Wt 224 lb (101.6 kg)   LMP 10/01/2019 (Exact Date)   BMI 38.45 kg/m   Assessment and Plan: Patient in the process of changing occupational areas and there will be a possible disruption in the progression of medical insurance.  Patient would like to refill medications during this.  Until she has established medical care. 1. Essential hypertension Chronic.  Controlled.  Stable.  Continue hydrochlorothiazide 25 mg once a day. - hydrochlorothiazide (HYDRODIURIL) 25 MG tablet; Take 1 tablet (25 mg total) by mouth daily.  Dispense: 90 tablet; Refill: 1  2. Gastroesophageal reflux disease, unspecified whether esophagitis present Chronic.  Controlled.  Stable.  Continue pantoprazole 40 mg once a day. - pantoprazole (PROTONIX) 40 MG tablet; Take 1 tablet once a day  Dispense: 90 tablet; Refill: 1  3. Performance anxiety Patient has some anxiety with presentations and large groups requiring 10 mg 1 tablet by mouth as needed. - propranolol (INDERAL) 10 MG tablet; TAKE 1 TABLET BY MOUTH DAILY AS NEEDED FOR PRESENTATION  Dispense: 20 tablet; Refill: 5  4. Sprain of right medial ankle joint,  subsequent encounter Acute.  New onset status post accident yesterday.  Patient was evaluated in urgent care and was noted to have a sprain with no fracture noted of the right medial aspect of her ankle.  Patient is having some more discomfort and she is anticipating having to have a long drive for a family and reunion of sorts.  Given the pain that is currently having an possible increase activity over the course of the end of the week and weekend patient was given some tramadol 50 mg as needed for significant pain not to exceed 3 a day over a  5-day.

## 2019-10-22 ENCOUNTER — Other Ambulatory Visit: Payer: Self-pay

## 2019-10-22 DIAGNOSIS — M545 Low back pain, unspecified: Secondary | ICD-10-CM

## 2019-10-22 MED ORDER — GABAPENTIN 100 MG PO CAPS: 100.0000 mg | ORAL_CAPSULE | Freq: Two times a day (BID) | ORAL | 0 refills | Status: DC

## 2019-10-22 NOTE — Progress Notes (Unsigned)
Sent in gabapentin

## 2019-11-01 ENCOUNTER — Other Ambulatory Visit: Payer: Self-pay | Admitting: Family Medicine

## 2019-11-20 DIAGNOSIS — Z31430 Encounter of female for testing for genetic disease carrier status for procreative management: Secondary | ICD-10-CM | POA: Diagnosis not present

## 2019-12-19 ENCOUNTER — Other Ambulatory Visit: Payer: Self-pay

## 2019-12-19 DIAGNOSIS — R110 Nausea: Secondary | ICD-10-CM

## 2019-12-19 MED ORDER — PROMETHAZINE HCL 25 MG PO TABS: 25.0000 mg | ORAL_TABLET | Freq: Three times a day (TID) | ORAL | 0 refills | Status: DC | PRN

## 2019-12-25 ENCOUNTER — Other Ambulatory Visit: Payer: Self-pay

## 2019-12-25 ENCOUNTER — Ambulatory Visit (INDEPENDENT_AMBULATORY_CARE_PROVIDER_SITE_OTHER): Payer: BLUE CROSS/BLUE SHIELD | Admitting: Family Medicine

## 2019-12-25 ENCOUNTER — Encounter: Payer: Self-pay | Admitting: Family Medicine

## 2019-12-25 VITALS — BP 130/80 | HR 80 | Ht 64.0 in | Wt 228.0 lb

## 2019-12-25 DIAGNOSIS — K219 Gastro-esophageal reflux disease without esophagitis: Secondary | ICD-10-CM | POA: Diagnosis not present

## 2019-12-25 DIAGNOSIS — M545 Low back pain, unspecified: Secondary | ICD-10-CM

## 2019-12-25 DIAGNOSIS — G580 Intercostal neuropathy: Secondary | ICD-10-CM

## 2019-12-25 DIAGNOSIS — F418 Other specified anxiety disorders: Secondary | ICD-10-CM | POA: Diagnosis not present

## 2019-12-25 DIAGNOSIS — I10 Essential (primary) hypertension: Secondary | ICD-10-CM

## 2019-12-25 MED ORDER — PROPRANOLOL HCL 10 MG PO TABS: ORAL_TABLET | ORAL | 5 refills | Status: DC

## 2019-12-25 MED ORDER — GABAPENTIN 100 MG PO CAPS: 100.0000 mg | ORAL_CAPSULE | Freq: Two times a day (BID) | ORAL | 2 refills | Status: DC

## 2019-12-25 MED ORDER — HYDROCHLOROTHIAZIDE 25 MG PO TABS: 25.0000 mg | ORAL_TABLET | Freq: Every day | ORAL | 1 refills | Status: DC

## 2019-12-25 MED ORDER — PANTOPRAZOLE SODIUM 40 MG PO TBEC: DELAYED_RELEASE_TABLET | ORAL | 1 refills | Status: DC

## 2019-12-25 NOTE — Progress Notes (Signed)
Date:  12/25/2019   Name:  Brenda [name]   DOB:  September 15, 1980   MRN:  [id number]   Chief Complaint: Hypertension, Gastroesophageal Reflux, Back Pain (takes gabapentin for this), and Anxiety (takes propranolol for presentations)  Hypertension This is a chronic problem. The current episode started more than 1 year ago. The problem has been gradually improving since onset. The problem is controlled. Associated symptoms include anxiety. Pertinent negatives include no blurred vision, chest pain, headaches, malaise/fatigue, neck pain, orthopnea, palpitations, peripheral edema, PND, shortness of breath or sweats. There are no associated agents to hypertension. Past treatments include diuretics. The current treatment provides moderate improvement. There are no compliance problems.  There is no history of angina, kidney disease, CAD/MI, CVA, heart failure, left ventricular hypertrophy, PVD or retinopathy. There is no history of chronic renal disease, a hypertension causing med or renovascular disease.  Gastroesophageal Reflux She reports no abdominal pain, no belching, no chest pain, no choking, no coughing, no dysphagia, no early satiety, no globus sensation, no heartburn, no hoarse voice, no nausea, no sore throat, no stridor, no tooth decay, no water brash or no wheezing. This is a recurrent problem. The problem occurs frequently. The problem has been waxing and waning (breakthrough reflux). The symptoms are aggravated by certain foods. Pertinent negatives include no fatigue. She has tried a PPI for the symptoms. The treatment provided moderate relief. Past procedures do not include an abdominal ultrasound, an EGD, esophageal manometry, esophageal pH monitoring, H. pylori antibody titer or a UGI.  Back Pain This is a chronic problem. The current episode started more than 1 year ago. The problem occurs daily. The problem has been waxing and waning since onset. The pain is present in the thoracic spine.  The quality of the pain is described as aching. The symptoms are aggravated by sitting. Pertinent negatives include no abdominal pain, chest pain, dysuria, fever, headaches, numbness, paresthesias, pelvic pain or weakness.  Anxiety Presents for follow-up visit. Symptoms include excessive worry and nervous/anxious behavior. Patient reports no chest pain, dizziness, nausea, palpitations or shortness of breath. The severity of symptoms is moderate.      Lab Results  Component Value Date   CREATININE 0.82 07/10/2019   BUN 15 07/10/2019   NA 141 07/10/2019   K 4.1 07/10/2019   CL 99 07/10/2019   CO2 23 07/10/2019   No results found for: CHOL, HDL, LDLCALC, LDLDIRECT, TRIG, CHOLHDL No results found for: TSH No results found for: HGBA1C Lab Results  Component Value Date   WBC 8.3 03/09/2018   HGB 14.1 03/09/2018   HCT 40.5 03/09/2018   MCV 89.5 03/09/2018   PLT 342 03/09/2018   Lab Results  Component Value Date   ALT 43 03/09/2018   AST 37 03/09/2018   ALKPHOS 52 03/09/2018   BILITOT 0.3 03/09/2018     Review of Systems  Constitutional: Negative.  Negative for chills, fatigue, fever, malaise/fatigue and unexpected weight change.  HENT: Negative for congestion, ear discharge, ear pain, hoarse voice, rhinorrhea, sinus pressure, sneezing and sore throat.   Eyes: Negative for blurred vision, photophobia, pain, discharge, redness and itching.  Respiratory: Negative for cough, choking, shortness of breath, wheezing and stridor.   Cardiovascular: Negative for chest pain, palpitations, orthopnea and PND.  Gastrointestinal: Negative for abdominal pain, blood in stool, constipation, diarrhea, dysphagia, heartburn, nausea and vomiting.  Endocrine: Negative for cold intolerance, heat intolerance, polydipsia, polyphagia and polyuria.  Genitourinary: Negative for dysuria, flank pain, frequency, hematuria, menstrual problem,  pelvic pain, urgency, vaginal bleeding and vaginal discharge.    Musculoskeletal: Positive for back pain. Negative for arthralgias, myalgias and neck pain.  Skin: Negative for rash.  Allergic/Immunologic: Negative for environmental allergies and food allergies.  Neurological: Negative for dizziness, weakness, light-headedness, numbness, headaches and paresthesias.  Hematological: Negative for adenopathy. Does not bruise/bleed easily.  Psychiatric/Behavioral: Negative for dysphoric mood. The patient is nervous/anxious.     Patient Active Problem List   Diagnosis Date Noted  . Gastroesophageal reflux disease 07/10/2019  . Essential hypertension 07/10/2019  . Performance anxiety 07/10/2019  . Epigastric abdominal pain 03/10/2018  . Abdominal pain   . Blood in stool   . Rectal polyp   . Heartburn   . Nausea   . Adult ADHD 12/07/2014    Allergies  Allergen Reactions  . Ace Inhibitors Swelling    Lips/ face    Past Surgical History:  Procedure Laterality Date  . BREAST REDUCTION SURGERY    . COLONOSCOPY WITH PROPOFOL N/A 11/09/2016   Procedure: COLONOSCOPY WITH PROPOFOL;  Surgeon: [name] Minium, MD;  Location: [facility];  Service: Endoscopy;  Laterality: N/A;  . ESOPHAGOGASTRODUODENOSCOPY (EGD) WITH PROPOFOL N/A 11/09/2016   Procedure: ESOPHAGOGASTRODUODENOSCOPY (EGD) WITH PROPOFOL;  Surgeon: [name] Minium, MD;  Location: [facility];  Service: Endoscopy;  Laterality: N/A;  . TONSILLECTOMY      Social History   Tobacco Use  . Smoking status: Never Smoker  . Smokeless tobacco: Never Used  Vaping Use  . Vaping Use: Never used  Substance Use Topics  . Alcohol use: Yes    Alcohol/week: 10.0 standard drinks    Types: 5 Glasses of wine, 5 Cans of beer per week  . Drug use: No     Medication list has been reviewed and updated.  Current Meds  Medication Sig  . ALPRAZolam (XANAX) 1 MG tablet Take 1 mg by mouth at bedtime as needed for anxiety. psych  . amphetamine-dextroamphetamine (ADDERALL) 20 MG tablet Take 20 mg by  mouth 2 (two) times daily. psych  . gabapentin (NEURONTIN) 100 MG capsule Take 1 capsule (100 mg total) by mouth 2 (two) times daily.  . hydrochlorothiazide (HYDRODIURIL) 25 MG tablet Take 1 tablet (25 mg total) by mouth daily.  . pantoprazole (PROTONIX) 40 MG tablet Take 1 tablet once a day  . promethazine (PHENERGAN) 25 MG tablet Take 1 tablet (25 mg total) by mouth every 8 (eight) hours as needed for nausea or vomiting.  . propranolol (INDERAL) 10 MG tablet TAKE 1 TABLET BY MOUTH DAILY AS NEEDED FOR PRESENTATION  . traZODone (DESYREL) 50 MG tablet Take 50 mg by mouth at bedtime. 1-3 tabs at bedtime Dr Evelene Croon    [facility] 2/9 Scores 12/25/2019 10/13/2019 07/10/2019 12/04/2018  PHQ - 2 Score 0 0 0 0  PHQ- 9 Score 0 0 0 0    GAD 7 : Generalized Anxiety Score 12/25/2019 10/13/2019 07/10/2019  Nervous, Anxious, on Edge 0 0 0  Control/stop worrying 0 0 0  Worry too much - different things 0 0 0  Trouble relaxing 0 0 0  Restless 0 0 0  Easily annoyed or irritable 0 0 0  Afraid - awful might happen 0 0 0  Total GAD 7 Score 0 0 0    BP Readings from Last 3 Encounters:  12/25/19 130/80  10/13/19 130/80  10/12/19 (!) 151/109    Physical Exam Vitals and nursing note reviewed.  Constitutional:      Appearance: She is well-developed.  HENT:  Head: Normocephalic.     Right Ear: Tympanic membrane, ear canal and external ear normal. There is no impacted cerumen.     Left Ear: Tympanic membrane, ear canal and external ear normal. There is no impacted cerumen.     Nose: Nose normal. No congestion or rhinorrhea.     Mouth/Throat:     Mouth: Mucous membranes are moist.  Eyes:     General: Lids are everted, no foreign bodies appreciated. No scleral icterus.       Left eye: No foreign body or hordeolum.     Conjunctiva/sclera: Conjunctivae normal.     Right eye: Right conjunctiva is not injected.     Left eye: Left conjunctiva is not injected.     Pupils: Pupils are equal, round, and reactive to light.   Neck:     Thyroid: No thyromegaly.     Vascular: No carotid bruit or JVD.     Trachea: No tracheal deviation.  Cardiovascular:     Rate and Rhythm: Normal rate and regular rhythm.     Heart sounds: Normal heart sounds. No murmur heard.  No friction rub. No gallop.   Pulmonary:     Effort: Pulmonary effort is normal. No respiratory distress.     Breath sounds: Normal breath sounds. No wheezing, rhonchi or rales.  Abdominal:     General: Bowel sounds are normal.     Palpations: Abdomen is soft. There is no mass.     Tenderness: There is no abdominal tenderness. There is no guarding or rebound.  Musculoskeletal:        General: No tenderness. Normal range of motion.     Cervical back: Normal range of motion and neck supple. No rigidity or tenderness.  Lymphadenopathy:     Cervical: No cervical adenopathy.  Skin:    General: Skin is warm.     Findings: No rash.  Neurological:     Mental Status: She is alert and oriented to person, place, and time.     Cranial Nerves: No cranial nerve deficit.     Deep Tendon Reflexes: Reflexes normal.  Psychiatric:        Mood and Affect: Mood is not anxious or depressed.     Wt Readings from Last 3 Encounters:  12/25/19 228 lb (103.4 kg)  10/13/19 224 lb (101.6 kg)  10/12/19 220 lb (99.8 kg)    BP 130/80   Pulse 80   Ht 5\' 4"  (1.626 m)   Wt 228 lb (103.4 kg)   LMP 12/08/2019 (Exact Date)   BMI 39.14 kg/m   Assessment and Plan: 1. Acute low back pain without sciatica, unspecified back pain laterality Chronic. Episodic. Or for acute intercostal cutaneous nerve pain from a thoracic vertebrae disc. Patient may continue gabapentin 100 mg twice a day as needed for severe pain - gabapentin (NEURONTIN) 100 MG capsule; Take 1 capsule (100 mg total) by mouth 2 (two) times daily.  Dispense: 60 capsule; Refill: 2  2. Essential hypertension Chronic. Controlled. Stable. Continue hydrochlorothiazide 25 mg once a day. - hydrochlorothiazide  (HYDRODIURIL) 25 MG tablet; Take 1 tablet (25 mg total) by mouth daily.  Dispense: 90 tablet; Refill: 1  3. Gastroesophageal reflux disease, unspecified whether esophagitis present Chronic. Controlled. Stable. Continue pantoprazole 40 mg once a day. - pantoprazole (PROTONIX) 40 MG tablet; Take 1 tablet once a day  Dispense: 90 tablet; Refill: 1  4. Performance anxiety Chronic. Episodic. Stable. Patient may continue propranolol on an as-needed basis for presentation anxiety. -  propranolol (INDERAL) 10 MG tablet; TAKE 1 TABLET BY MOUTH DAILY AS NEEDED FOR PRESENTATION  Dispense: 20 tablet; Refill: 5  5. Neuropathy intercostal nerve. Has new onset from the intercostal nerve distribution of pain previously seen by chiropractor and had exercises that stretched the vertebral column with some relief. Likely this is a compression circumstance which is requiring the occasional gabapentin. If this does continue we will need to refer to physical therapy for further treatment.

## 2020-04-27 ENCOUNTER — Other Ambulatory Visit: Payer: Self-pay | Admitting: Family Medicine

## 2020-04-27 DIAGNOSIS — K219 Gastro-esophageal reflux disease without esophagitis: Secondary | ICD-10-CM

## 2020-05-07 ENCOUNTER — Other Ambulatory Visit: Payer: Self-pay

## 2020-05-07 DIAGNOSIS — F418 Other specified anxiety disorders: Secondary | ICD-10-CM

## 2020-05-07 DIAGNOSIS — M545 Low back pain, unspecified: Secondary | ICD-10-CM

## 2020-05-07 MED ORDER — GABAPENTIN 100 MG PO CAPS: 100.0000 mg | ORAL_CAPSULE | Freq: Two times a day (BID) | ORAL | 0 refills | Status: DC

## 2020-05-07 MED ORDER — PROPRANOLOL HCL 10 MG PO TABS: ORAL_TABLET | ORAL | 1 refills | Status: DC

## 2020-05-07 NOTE — Progress Notes (Unsigned)
Sent in Gab and Propranolol to [facility]

## 2020-05-23 ENCOUNTER — Other Ambulatory Visit: Payer: Self-pay | Admitting: Family Medicine

## 2020-05-23 DIAGNOSIS — K219 Gastro-esophageal reflux disease without esophagitis: Secondary | ICD-10-CM

## 2020-06-09 ENCOUNTER — Other Ambulatory Visit: Payer: Self-pay | Admitting: Family Medicine

## 2020-06-09 DIAGNOSIS — K219 Gastro-esophageal reflux disease without esophagitis: Secondary | ICD-10-CM

## 2020-06-26 NOTE — L&D Delivery Note (Signed)
Delivery Note At 2:08 AM a viable female was delivered via Vaginal, Spontaneous (Presentation:   Occiput Anterior).  APGAR: 8; weight pending.   Placenta status: Spontaneous, Intact.  Cord: 3 vessels with the following complications: None.  Cord pH: N/A  Anesthesia: Epidural Episiotomy: None Lacerations: 2nd degree Suture Repair: 3.0 Monocryl Est. Blood Loss (mL): 425  Mom to postpartum.  Baby to Couplet care / Skin to Skin.  Vena [place] 03/16/2021, 2:42 AM

## 2020-07-14 ENCOUNTER — Other Ambulatory Visit: Payer: Self-pay | Admitting: Family Medicine

## 2020-07-14 DIAGNOSIS — I10 Essential (primary) hypertension: Secondary | ICD-10-CM

## 2020-08-05 ENCOUNTER — Encounter: Payer: Self-pay | Admitting: Obstetrics and Gynecology

## 2020-08-05 ENCOUNTER — Other Ambulatory Visit: Payer: Self-pay

## 2020-08-05 ENCOUNTER — Ambulatory Visit (INDEPENDENT_AMBULATORY_CARE_PROVIDER_SITE_OTHER): Payer: BC Managed Care – PPO | Admitting: Obstetrics and Gynecology

## 2020-08-05 ENCOUNTER — Other Ambulatory Visit (HOSPITAL_COMMUNITY)
Admission: RE | Admit: 2020-08-05 | Discharge: 2020-08-05 | Disposition: A | Discharge status: Home / Self Care | Payer: BC Managed Care – PPO | Source: Ambulatory Visit | Attending: Obstetrics and Gynecology | Admitting: Obstetrics and Gynecology

## 2020-08-05 VITALS — BP 156/95 | HR 75 | Wt 197.5 lb

## 2020-08-05 DIAGNOSIS — R55 Syncope and collapse: Secondary | ICD-10-CM

## 2020-08-05 DIAGNOSIS — Z3689 Encounter for other specified antenatal screening: Secondary | ICD-10-CM

## 2020-08-05 DIAGNOSIS — Z369 Encounter for antenatal screening, unspecified: Secondary | ICD-10-CM

## 2020-08-05 DIAGNOSIS — O10019 Pre-existing essential hypertension complicating pregnancy, unspecified trimester: Secondary | ICD-10-CM

## 2020-08-05 DIAGNOSIS — O0990 Supervision of high risk pregnancy, unspecified, unspecified trimester: Secondary | ICD-10-CM | POA: Insufficient documentation

## 2020-08-05 DIAGNOSIS — Z124 Encounter for screening for malignant neoplasm of cervix: Secondary | ICD-10-CM | POA: Diagnosis not present

## 2020-08-05 DIAGNOSIS — O09529 Supervision of elderly multigravida, unspecified trimester: Secondary | ICD-10-CM | POA: Insufficient documentation

## 2020-08-05 DIAGNOSIS — N926 Irregular menstruation, unspecified: Secondary | ICD-10-CM | POA: Diagnosis not present

## 2020-08-05 DIAGNOSIS — Z113 Encounter for screening for infections with a predominantly sexual mode of transmission: Secondary | ICD-10-CM | POA: Insufficient documentation

## 2020-08-05 DIAGNOSIS — O99210 Obesity complicating pregnancy, unspecified trimester: Secondary | ICD-10-CM | POA: Diagnosis not present

## 2020-08-05 DIAGNOSIS — Z3A01 Less than 8 weeks gestation of pregnancy: Secondary | ICD-10-CM

## 2020-08-05 LAB — POCT URINE PREGNANCY: Preg Test, Ur: POSITIVE — AB

## 2020-08-05 MED ORDER — DOXYLAMINE-PYRIDOXINE 10-10 MG PO TBEC: 2.0000 | DELAYED_RELEASE_TABLET | Freq: Every day | ORAL | 5 refills | Status: DC

## 2020-08-05 MED ORDER — LABETALOL HCL 200 MG PO TABS: 200.0000 mg | ORAL_TABLET | Freq: Two times a day (BID) | ORAL | 3 refills | Status: DC

## 2020-08-05 NOTE — Progress Notes (Signed)
New Obstetric Patient H&P    Chief Complaint: "Desires prenatal care"   History of Present Illness: Patient is a 40 y.o. G3P1011 Not Hispanic or Latino female, presents with amenorrhea and positive home pregnancy test. Patient's last menstrual period was 06/18/2020 (exact date). and based on her  LMP, her EDD is Estimated Date of Delivery: 03/25/21 and her EGA is [redacted]w[redacted]d.  Her last pap smear was 09/12/2016 and was no abnormalities.    She had a urine pregnancy test which was positive 2 week(s)  ago. Her last menstrual period was normal . Since her LMP she claims she has experienced mild nausea, fatigue. She denies vaginal bleeding. Her past medical history is contibutory  HTN. Her prior pregnancies are notable for none  Since her LMP, she admits to the use of tobacco products  no She admits close contact with children on a regular basis  yes  She has had chicken pox in the past yes She has had Tuberculosis exposures, symptoms, or previously tested positive for TB   no Current or past history of domestic violence. no  Genetic Screening/Teratology Counseling: (Includes patient, baby's father, or anyone in either family with:)   1. Patient's age >/= 55 at [facility]  yes 2. Thalassemia ([place], [place], Mediterranean, or Asian background): MCV<80  no 3. Neural tube defect (meningomyelocele, spina bifida, anencephaly)  no 4. Congenital heart defect  no  5. Down syndrome  no 6. Tay-Sachs (Jewish, [place]  no 7. Canavan's Disease  no 8. Sickle cell disease or trait (African)  no  9. Hemophilia or other blood disorders  no  10. Muscular dystrophy  no  11. Cystic fibrosis  no  12. Huntington's Chorea  no  13. Mental retardation/autism  no 14. Other inherited genetic or chromosomal disorder  no 15. Maternal metabolic disorder (DM, PKU, etc)  no 16. Patient or FOB with a child with a birth defect not listed above no  16a. Patient or FOB with a birth defect themselves no 17. Recurrent  pregnancy loss, or stillbirth  no  18. Any medications since LMP other than prenatal vitamins (include vitamins, supplements, OTC meds, drugs, alcohol)  no 19. Any other genetic/environmental exposure to discuss  no  Infection History:   1. Lives with someone with TB or TB exposed  no  2. Patient or partner has history of genital herpes  no 3. Rash or viral illness since LMP  no 4. History of STI (GC, CT, HPV, syphilis, HIV)  no 5. History of recent travel :  no  Other pertinent information:  no     Review of Systems:10 point review of systems negative unless otherwise noted in HPI  Past Medical History:  Patient Active Problem List   Diagnosis Date Noted  . Gastroesophageal reflux disease 07/10/2019  . Essential hypertension 07/10/2019    increased HCTZ to 25mg - recheck B/P in 4 weeks   . Performance anxiety 07/10/2019  . Epigastric abdominal pain 03/10/2018  . Abdominal pain   . Blood in stool   . Rectal polyp   . Heartburn   . Nausea   . Adult ADHD 12/07/2014    Past Surgical History:  Past Surgical History:  Procedure Laterality Date  . BREAST REDUCTION SURGERY    . COLONOSCOPY WITH PROPOFOL N/A 11/09/2016   Procedure: COLONOSCOPY WITH PROPOFOL;  Surgeon: 11/11/2016, MD;  Location: [facility];  Service: Endoscopy;  Laterality: N/A;  . ESOPHAGOGASTRODUODENOSCOPY (EGD) WITH PROPOFOL N/A 11/09/2016   Procedure: ESOPHAGOGASTRODUODENOSCOPY (  EGD) WITH PROPOFOL;  Surgeon: [name] Minium, MD;  Location: [facility];  Service: Endoscopy;  Laterality: N/A;  . TONSILLECTOMY      Gynecologic History: Patient's last menstrual period was 06/18/2020 (exact date).  Obstetric History: G3P1011  Family History:  Family History  Problem Relation Age of Onset  . Heart disease Maternal Grandmother   . Heart disease Maternal Grandfather   . Diabetes Paternal Grandfather   . Hypertension Mother   . Hypertension Father     Social History:  Social History    Socioeconomic History  . Marital status: Married    Spouse name: Not on file  . Number of children: Not on file  . Years of education: Not on file  . Highest education level: Not on file  Occupational History  . Not on file  Tobacco Use  . Smoking status: Never Smoker  . Smokeless tobacco: Never Used  Vaping Use  . Vaping Use: Never used  Substance and Sexual Activity  . Alcohol use: Yes    Alcohol/week: 10.0 standard drinks    Types: 5 Glasses of wine, 5 Cans of beer per week  . Drug use: No  . Sexual activity: Yes    Birth control/protection: None  Other Topics Concern  . Not on file  Social History Narrative  . Not on file   Social Determinants of Health   Financial Resource Strain: Not on file  Food Insecurity: Not on file  Transportation Needs: Not on file  Physical Activity: Not on file  Stress: Not on file  Social Connections: Not on file  Intimate Partner Violence: Not on file    Allergies:  Allergies  Allergen Reactions  . Ace Inhibitors Swelling    Lips/ face    Medications: Prior to Admission medications   Medication Sig Start Date End Date Taking? Authorizing Provider  hydrochlorothiazide (HYDRODIURIL) 25 MG tablet TAKE 1 TABLET BY MOUTH EVERY DAY 07/14/20  Yes Duanne Limerick, MD  pantoprazole (PROTONIX) 40 MG tablet Take 1 tablet once a day 12/25/19  Yes Duanne Limerick, MD  ALPRAZolam Prudy Feeler) 1 MG tablet Take 1 mg by mouth at bedtime as needed for anxiety. psych Patient not taking: Reported on 08/05/2020    [provider]  amphetamine-dextroamphetamine (ADDERALL) 20 MG tablet Take 20 mg by mouth 2 (two) times daily. psych Patient not taking: Reported on 08/05/2020 02/21/18   [provider]  gabapentin (NEURONTIN) 100 MG capsule Take 1 capsule (100 mg total) by mouth 2 (two) times daily. Patient not taking: Reported on 08/05/2020 05/07/20   Duanne Limerick, MD  promethazine (PHENERGAN) 25 MG tablet Take 1 tablet (25 mg total) by  mouth every 8 (eight) hours as needed for nausea or vomiting. Patient not taking: Reported on 08/05/2020 12/19/19   Reubin Milan, MD  propranolol (INDERAL) 10 MG tablet TAKE 1 TABLET BY MOUTH DAILY AS NEEDED FOR PRESENTATION Patient not taking: Reported on 08/05/2020 05/07/20   Duanne Limerick, MD  traZODone (DESYREL) 50 MG tablet Take 50 mg by mouth at bedtime. 1-3 tabs at bedtime Dr Evelene Croon Patient not taking: Reported on 08/05/2020    [provider]    Physical Exam Vitals: Blood pressure (!) 156/95, pulse 75, weight 197 lb 8 oz (89.6 kg), last menstrual period 06/18/2020. Body mass index is 33.9 kg/m.   General: NAD HEENT: normocephalic, anicteric Thyroid: no enlargement, no palpable nodules Pulmonary: No increased work of breathing, CTAB Cardiovascular: RRR, distal pulses 2+ Abdomen: NABS,  soft, non-tender, non-distended.  Umbilicus without lesions.  No hepatomegaly, splenomegaly or masses palpable. No evidence of hernia  Genitourinary:  External: Normal external female genitalia.  Normal urethral meatus, normal  Bartholin's and Skene's glands.    Vagina: Normal vaginal mucosa, no evidence of prolapse.    Cervix: Grossly normal in appearance, no bleeding  Uterus:  Non-enlarged, mobile, normal contour.  No CMT  Adnexa: ovaries non-enlarged, no adnexal masses  Rectal: deferred Extremities: no edema, erythema, or tenderness Neurologic: Grossly intact Psychiatric: mood appropriate, affect full   Assessment: 40 y.o. G3P1011 at [redacted]w[redacted]d presenting to initiate prenatal care  Plan: 1) Avoid alcoholic beverages. 2) Patient encouraged not to smoke.  3) Discontinue the use of all non-medicinal drugs and chemicals.  4) Take prenatal vitamins daily.  5) Nutrition, food safety (fish, cheese advisories, and high nitrite foods) and exercise discussed. 6) Hospital and practice style discussed with cross coverage system.  7) Genetic Screening, such as with 1st Trimester Screening,  cell free fetal DNA, AFP testing, and Ultrasound, as well as with amniocentesis and [facility] as appropriate, is discussed with patient. At the conclusion of today's visit patient requested genetic testing 8) Dating scan ordered 9) Switch to labetalol 200mg  po bid previously on metroprolol discontinued with pregnancy.  Baseline PIH labs 10) Did report a syncopal episode recently in the evening husband found her on floor she does not recall event, no postictal state.  Given history of HTN and recent discontinuation of metoprolol will send to cardiology for further evaluation   , MD, [facility] [place] [facility], [facility] 08/05/2020, 11:12 AM

## 2020-08-06 LAB — RPR+RH+ABO+RUB AB+AB SCR+CB...
Antibody Screen: NEGATIVE
HIV Screen 4th Generation wRfx: NONREACTIVE
Hematocrit: 45 % (ref 34.0–46.6)
Hemoglobin: 15.5 g/dL (ref 11.1–15.9)
Hepatitis B Surface Ag: NEGATIVE
MCH: 29.8 pg (ref 26.6–33.0)
MCHC: 34.4 g/dL (ref 31.5–35.7)
MCV: 86 fL (ref 79–97)
Platelets: 390 10*3/uL (ref 150–450)
RBC: 5.21 x10E6/uL (ref 3.77–5.28)
RDW: 12 % (ref 11.7–15.4)
RPR Ser Ql: NONREACTIVE
Rh Factor: POSITIVE
Rubella Antibodies, IGG: 1.64 index (ref >0.99)
Varicella zoster IgG: 722 index (ref >165)
WBC: 14.4 10*3/uL — ABNORMAL HIGH (ref 3.4–10.8)

## 2020-08-06 LAB — PROTEIN / CREATININE RATIO, URINE
Creatinine, Urine: 150.3 mg/dL
Protein, Ur: 26.9 mg/dL
Protein/Creat Ratio: 179 mg/g creat (ref 0–200)

## 2020-08-06 LAB — COMPREHENSIVE METABOLIC PANEL
ALT: 17 IU/L (ref 0–32)
AST: 14 IU/L (ref 0–40)
Albumin/Globulin Ratio: 1.5 (ref 1.2–2.2)
Albumin: 4.6 g/dL (ref 3.8–4.8)
Alkaline Phosphatase: 62 IU/L (ref 44–121)
BUN/Creatinine Ratio: 18 (ref 9–23)
BUN: 12 mg/dL (ref 6–20)
Bilirubin Total: 0.3 mg/dL (ref 0.0–1.2)
CO2: 21 mmol/L (ref 20–29)
Calcium: 10.5 mg/dL — ABNORMAL HIGH (ref 8.7–10.2)
Chloride: 99 mmol/L (ref 96–106)
Creatinine, Ser: 0.68 mg/dL (ref 0.57–1.00)
GFR calc Af Amer: 127 mL/min/{1.73_m2} (ref >59)
GFR calc non Af Amer: 111 mL/min/{1.73_m2} (ref >59)
Globulin, Total: 3 g/dL (ref 1.5–4.5)
Glucose: 66 mg/dL (ref 65–99)
Potassium: 3.6 mmol/L (ref 3.5–5.2)
Sodium: 138 mmol/L (ref 134–144)
Total Protein: 7.6 g/dL (ref 6.0–8.5)

## 2020-08-06 LAB — TSH: TSH: 1.64 u[IU]/mL (ref 0.450–4.500)

## 2020-08-06 LAB — HEPATITIS C ANTIBODY: Hep C Virus Ab: <0.1 s/co ratio (ref 0.0–0.9)

## 2020-08-07 LAB — URINE CULTURE

## 2020-08-10 ENCOUNTER — Ambulatory Visit (INDEPENDENT_AMBULATORY_CARE_PROVIDER_SITE_OTHER): Payer: BC Managed Care – PPO | Admitting: Cardiology

## 2020-08-10 ENCOUNTER — Ambulatory Visit (INDEPENDENT_AMBULATORY_CARE_PROVIDER_SITE_OTHER): Payer: BC Managed Care – PPO

## 2020-08-10 ENCOUNTER — Other Ambulatory Visit: Payer: Self-pay

## 2020-08-10 ENCOUNTER — Other Ambulatory Visit: Payer: Self-pay | Admitting: Family Medicine

## 2020-08-10 ENCOUNTER — Encounter: Payer: Self-pay | Admitting: Cardiology

## 2020-08-10 VITALS — BP 115/74 | HR 58 | Ht 64.0 in | Wt 208.0 lb

## 2020-08-10 DIAGNOSIS — R55 Syncope and collapse: Secondary | ICD-10-CM

## 2020-08-10 DIAGNOSIS — I10 Essential (primary) hypertension: Secondary | ICD-10-CM

## 2020-08-10 DIAGNOSIS — Z3A01 Less than 8 weeks gestation of pregnancy: Secondary | ICD-10-CM | POA: Diagnosis not present

## 2020-08-10 LAB — CYTOLOGY - PAP
Chlamydia: NEGATIVE
Comment: NEGATIVE
Comment: NEGATIVE
Comment: NORMAL
Diagnosis: UNDETERMINED — AB
High risk HPV: NEGATIVE
Neisseria Gonorrhea: NEGATIVE

## 2020-08-10 NOTE — Patient Instructions (Signed)
Medication Instructions:   Your physician has recommended you make the following change in your medication:   1. STOP taking your hydrochlorothiazide (HYDRODIURIL) .  *If you need a refill on your cardiac medications before your next appointment, please call your pharmacy*   Lab Work: None ordered    Testing/Procedures:  1.  Your physician has requested that you have an echocardiogram. Echocardiography is a painless test that uses sound waves to create images of your heart. It provides your doctor with information about the size and shape of your heart and how well your heart's chambers and valves are working. This procedure takes approximately one hour. There are no restrictions for this procedure.  2.  Your physician has recommended that you wear a Zio monitor for 2 weeks. This monitor is a medical device that records the heart's electrical activity. Doctors most often use these monitors to diagnose arrhythmias. Arrhythmias are problems with the speed or rhythm of the heartbeat. The monitor is a small device applied to your chest. You can wear one while you do your normal daily activities. While wearing this monitor if you have any symptoms to push the button and record what you felt. Once you have worn this monitor for the period of time provider prescribed (Usually 14 days), you will return the monitor device in the postage paid box. Once it is returned they will download the data collected and provide [place] with a report which the provider will then review and we will call you with those results. Important tips:  1. Avoid showering during the first 24 hours of wearing the monitor. 2. Avoid excessive sweating to help maximize wear time. 3. Do not submerge the device, no hot tubs, and no swimming pools. 4. Keep any lotions or oils away from the patch. 5. After 24 hours you may shower with the patch on. Take brief showers with your back facing the shower head.  6. Do not remove patch once it  has been placed because that will interrupt data and decrease adhesive wear time. 7. Push the button when you have any symptoms and write down what you were feeling. 8. Once you have completed wearing your monitor, remove and place into box which has postage paid and place in your outgoing mailbox.  9. If for some reason you have misplaced your box then call our office and we can provide another box and/or mail it off for you.         Follow-Up: At [facility], you and your health needs are our priority.  As part of our continuing mission to provide you with exceptional heart care, we have created designated Provider Care Teams.  These Care Teams include your primary Cardiologist (physician) and Advanced Practice Providers (APPs -  Physician Assistants and Nurse Practitioners) who all work together to provide you with the care you need, when you need it.  We recommend signing up for the patient portal called "MyChart".  Sign up information is provided on this After Visit Summary.  MyChart is used to connect with patients for Virtual Visits (Telemedicine).  Patients are able to view lab/test results, encounter notes, upcoming appointments, etc.  Non-urgent messages can be sent to your provider as well.   To learn more about what you can do with MyChart, go to [email or web address].    Your next appointment:   6 week(s)  The format for your next appointment:   In Person  Provider:   Debbe Odea, MD   Other Instructions

## 2020-08-10 NOTE — Progress Notes (Signed)
Cardiology Office Note:    Date:  08/10/2020   ID:  Brenda [name], DOB 04/05/1981, MRN [id number]  PCP:  Duanne LimerickJones, Deanna C, MD   [facility]  Cardiologist:  Debbe OdeaBrian Agbor-Etang, MD  Advanced Practice Provider:  No care team member to display Electrophysiologist:  None       Referring MD: Vena AustriaStaebler, Andreas, MD   Chief Complaint  Patient presents with  . New Patient (Initial Visit)    Referred by Dr. Bonney AidStaebler for Syncope in [redacted] weeks gestation of pregnancy  States she has passed out twice since last OB visit--states those happened with positional changes from laying down to standing up. States she is not sure if it is because she had stopped all of her medications when she found out she was pregnant. Pt also states she has gained 10 lbs since last Thursday.   Brenda [name] is a 40 y.o. female who is being seen today for the evaluation of syncope at the request of Vena AustriaStaebler, Andreas, MD.   History of Present Illness:    Brenda [name] is a 40 y.o. female with a hx of anxiety, hypertension, currently [redacted] weeks pregnant who presents due to syncope.  Patient states passing out a week ago while at home.  She woke up to go to the bathroom, felt dizzy, does not remember much from there, only remembers her husband waking her up from the floor.  She states having a ringing sensation in her left ear which lasted a couple of seconds and then went away.  She has had occasional dizzy spells usually when getting up from a seated position.  Denies any history of heart disease, had some palpitations with her earlier pregnancy, rarely has any right now.  She has been taking BP medications/HCTZ and labetalol for years now.  Past Medical History:  Diagnosis Date  . ADHD, adult residual type   . Anxiety   . Depression   . GERD (gastroesophageal reflux disease)   . Hypertension   . Wears contact lenses     Past Surgical History:  Procedure Laterality Date  . BREAST  REDUCTION SURGERY    . COLONOSCOPY WITH PROPOFOL N/A 11/09/2016   Procedure: COLONOSCOPY WITH PROPOFOL;  Surgeon: [name] MiniumWohl, Darren, MD;  [facility];  Service: Endoscopy;  Laterality: N/A;  . ESOPHAGOGASTRODUODENOSCOPY (EGD) WITH PROPOFOL N/A 11/09/2016   Procedure: ESOPHAGOGASTRODUODENOSCOPY (EGD) WITH PROPOFOL;  Surgeon: [name] MiniumWohl, Darren, MD;  [facility];  Service: Endoscopy;  Laterality: N/A;  . TONSILLECTOMY      Current Medications: Current Meds  Medication Sig  . amphetamine-dextroamphetamine (ADDERALL) 20 MG tablet Take 20 mg by mouth 2 (two) times daily.  . Doxylamine-Pyridoxine (DICLEGIS) 10-10 MG TBEC Take 2 tablets by mouth at bedtime. If symptoms persist, add one tablet in the morning and one in the afternoon  . labetalol (NORMODYNE) 200 MG tablet Take 1 tablet (200 mg total) by mouth 2 (two) times daily.  . pantoprazole (PROTONIX) 40 MG tablet Take 1 tablet once a day  . promethazine (PHENERGAN) 25 MG tablet Take 1 tablet (25 mg total) by mouth every 8 (eight) hours as needed for nausea or vomiting.  . traZODone (DESYREL) 100 MG tablet Take 200 mg by mouth at bedtime.  . [DISCONTINUED] hydrochlorothiazide (HYDRODIURIL) 25 MG tablet TAKE 1 TABLET BY MOUTH EVERY DAY     Allergies:   Ace inhibitors   Social History   Socioeconomic History  . Marital status: Married  Spouse name: Not on file  . Number of children: Not on file  . Years of education: Not on file  . Highest education level: Not on file  Occupational History  . Not on file  Tobacco Use  . Smoking status: Never Smoker  . Smokeless tobacco: Never Used  Vaping Use  . Vaping Use: Never used  Substance and Sexual Activity  . Alcohol use: Yes    Alcohol/week: 10.0 standard drinks    Types: 5 Glasses of wine, 5 Cans of beer per week  . Drug use: No  . Sexual activity: Yes    Birth control/protection: None  Other Topics Concern  . Not on file  Social History Narrative  . Not on  file   Social Determinants of Health   Financial Resource Strain: Not on file  Food Insecurity: Not on file  Transportation Needs: Not on file  Physical Activity: Not on file  Stress: Not on file  Social Connections: Not on file     Family History: The patient's family history includes Diabetes in her paternal grandfather; Heart disease in her maternal grandfather and maternal grandmother; Hypertension in her father and mother.  ROS:   Please see the history of present illness.     All other systems reviewed and are negative.  EKGs/Labs/Other Studies Reviewed:    The following studies were reviewed today:   EKG:  EKG is  ordered today.  The ekg ordered today demonstrates sinus bradycardia, heart rate 51 otherwise normal ECG.  Recent Labs: 08/05/2020: ALT 17; BUN 12; Creatinine, Ser 0.68; Hemoglobin 15.5; Platelets 390; Potassium 3.6; Sodium 138; TSH 1.640  Recent Lipid Panel No results found for: CHOL, TRIG, HDL, CHOLHDL, VLDL, LDLCALC, LDLDIRECT   Risk Assessment/Calculations:      Physical Exam:    VS:  BP 115/74   Pulse (!) 58   Ht 5\' 4"  (1.626 m)   Wt 208 lb (94.3 kg)   LMP 06/18/2020 (Exact Date)   BMI 35.70 kg/m     Wt Readings from Last 3 Encounters:  08/10/20 208 lb (94.3 kg)  08/05/20 197 lb 8 oz (89.6 kg)  12/25/19 228 lb (103.4 kg)     GEN:  Well nourished, well developed in no acute distress HEENT: Normal NECK: No JVD; No carotid bruits LYMPHATICS: No lymphadenopathy CARDIAC: RRR, no murmurs, rubs, gallops RESPIRATORY:  Clear to auscultation without rales, wheezing or rhonchi  ABDOMEN: Soft, non-tender, non-distended MUSCULOSKELETAL:  No edema; No deformity  SKIN: Warm and dry NEUROLOGIC:  Alert and oriented x 3 PSYCHIATRIC:  Normal affect   ASSESSMENT:    1. Syncope and collapse   2. Primary hypertension   3. [redacted] weeks gestation of pregnancy    PLAN:    In order of problems listed above:  1. Syncope, dizziness.  Orthostatic vitals in  the ER with no evidence of orthostasis.  Felt lightheaded when moving from laying to standing position.  Place cardiac monitor x2 weeks, get echocardiogram to evaluate cardiac etiology for syncope.  Vertigo is a possible etiology.  We will stop HCTZ in case this is contributing and also due to patient being pregnant. 2. Hypertension, BP controlled.  Continue labetalol, stop HCTZ as patient is pregnant.  Can increase labetalol to control blood pressure if needed. 3. [redacted] weeks pregnant, management as per OB/GYN.   Follow-up after echo and cardiac monitor.   Medication Adjustments/Labs and Tests Ordered: Current medicines are reviewed at length with the patient today.  Concerns regarding medicines  are outlined above.  Orders Placed This Encounter  Procedures  . LONG TERM MONITOR (3-14 DAYS)  . EKG 12-Lead  . ECHOCARDIOGRAM COMPLETE   No orders of the defined types were placed in this encounter.   Patient Instructions  Medication Instructions:   Your physician has recommended you make the following change in your medication:   1. STOP taking your hydrochlorothiazide (HYDRODIURIL) .  *If you need a refill on your cardiac medications before your next appointment, please call your pharmacy*   Lab Work: None ordered    Testing/Procedures:  1.  Your physician has requested that you have an echocardiogram. Echocardiography is a painless test that uses sound waves to create images of your heart. It provides your doctor with information about the size and shape of your heart and how well your heart's chambers and valves are working. This procedure takes approximately one hour. There are no restrictions for this procedure.  2.  Your physician has recommended that you wear a Zio monitor for 2 weeks. This monitor is a medical device that records the heart's electrical activity. Doctors most often use these monitors to diagnose arrhythmias. Arrhythmias are problems with the speed or rhythm of the  heartbeat. The monitor is a small device applied to your chest. You can wear one while you do your normal daily activities. While wearing this monitor if you have any symptoms to push the button and record what you felt. Once you have worn this monitor for the period of time provider prescribed (Usually 14 days), you will return the monitor device in the postage paid box. Once it is returned they will download the data collected and provide [place] with a report which the provider will then review and we will call you with those results. Important tips:  1. Avoid showering during the first 24 hours of wearing the monitor. 2. Avoid excessive sweating to help maximize wear time. 3. Do not submerge the device, no hot tubs, and no swimming pools. 4. Keep any lotions or oils away from the patch. 5. After 24 hours you may shower with the patch on. Take brief showers with your back facing the shower head.  6. Do not remove patch once it has been placed because that will interrupt data and decrease adhesive wear time. 7. Push the button when you have any symptoms and write down what you were feeling. 8. Once you have completed wearing your monitor, remove and place into box which has postage paid and place in your outgoing mailbox.  9. If for some reason you have misplaced your box then call our office and we can provide another box and/or mail it off for you.         Follow-Up: At [facility], you and your health needs are our priority.  As part of our continuing mission to provide you with exceptional heart care, we have created designated Provider Care Teams.  These Care Teams include your primary Cardiologist (physician) and Advanced Practice Providers (APPs -  Physician Assistants and Nurse Practitioners) who all work together to provide you with the care you need, when you need it.  We recommend signing up for the patient portal called "MyChart".  Sign up information is provided on this After Visit  Summary.  MyChart is used to connect with patients for Virtual Visits (Telemedicine).  Patients are able to view lab/test results, encounter notes, upcoming appointments, etc.  Non-urgent messages can be sent to your provider as well.   To  learn more about what you can do with MyChart, go to [email or web address].    Your next appointment:   6 week(s)  The format for your next appointment:   In Person  Provider:   Debbe Odea, MD   Other Instructions      Signed, Debbe Odea, MD  08/10/2020 10:48 AM    [facility]

## 2020-08-12 ENCOUNTER — Ambulatory Visit: Payer: BC Managed Care – PPO

## 2020-08-12 ENCOUNTER — Encounter: Payer: BC Managed Care – PPO | Admitting: Advanced Practice Midwife

## 2020-08-12 DIAGNOSIS — Z3689 Encounter for other specified antenatal screening: Secondary | ICD-10-CM

## 2020-08-12 DIAGNOSIS — O0990 Supervision of high risk pregnancy, unspecified, unspecified trimester: Secondary | ICD-10-CM

## 2020-08-16 ENCOUNTER — Ambulatory Visit: Payer: BLUE CROSS/BLUE SHIELD | Admitting: Family Medicine

## 2020-08-16 DIAGNOSIS — Z20822 Contact with and (suspected) exposure to covid-19: Secondary | ICD-10-CM | POA: Diagnosis not present

## 2020-08-16 DIAGNOSIS — Z03818 Encounter for observation for suspected exposure to other biological agents ruled out: Secondary | ICD-10-CM | POA: Diagnosis not present

## 2020-08-19 ENCOUNTER — Ambulatory Visit: Payer: BC Managed Care – PPO

## 2020-08-19 ENCOUNTER — Encounter: Payer: BC Managed Care – PPO | Admitting: Advanced Practice Midwife

## 2020-08-20 ENCOUNTER — Other Ambulatory Visit: Payer: Self-pay | Admitting: Obstetrics and Gynecology

## 2020-08-20 DIAGNOSIS — O0990 Supervision of high risk pregnancy, unspecified, unspecified trimester: Secondary | ICD-10-CM

## 2020-08-20 DIAGNOSIS — Z3689 Encounter for other specified antenatal screening: Secondary | ICD-10-CM

## 2020-08-23 ENCOUNTER — Ambulatory Visit: Payer: BC Managed Care – PPO

## 2020-08-25 ENCOUNTER — Ambulatory Visit
Admission: RE | Admit: 2020-08-25 | Discharge: 2020-08-25 | Disposition: A | Discharge status: Home / Self Care | Payer: BC Managed Care – PPO | Source: Ambulatory Visit | Attending: Obstetrics and Gynecology | Admitting: Obstetrics and Gynecology

## 2020-08-25 ENCOUNTER — Other Ambulatory Visit: Payer: Self-pay

## 2020-08-25 ENCOUNTER — Ambulatory Visit: Payer: BC Managed Care – PPO

## 2020-08-25 DIAGNOSIS — O0990 Supervision of high risk pregnancy, unspecified, unspecified trimester: Secondary | ICD-10-CM | POA: Diagnosis not present

## 2020-08-25 DIAGNOSIS — Z3689 Encounter for other specified antenatal screening: Secondary | ICD-10-CM | POA: Diagnosis not present

## 2020-08-25 DIAGNOSIS — O09511 Supervision of elderly primigravida, first trimester: Secondary | ICD-10-CM | POA: Diagnosis not present

## 2020-08-25 DIAGNOSIS — Z3A09 9 weeks gestation of pregnancy: Secondary | ICD-10-CM | POA: Diagnosis not present

## 2020-08-25 IMAGING — US US OB < 14 WEEKS - US OB TV
1 series · 14 of 28 positions shown · non-contrast
Comparison: None

CLINICAL DATA: First trimester pregnancy, dating

EXAM:
OBSTETRIC <14 WK US AND TRANSVAGINAL OB US
TECHNIQUE: Both transabdominal and transvaginal ultrasound examinations were
performed for complete evaluation of the gestation as well as the
maternal uterus, adnexal regions, and pelvic cul-de-sac.
Transvaginal technique was performed to assess early pregnancy.

[Series 1: us ob less than 14 weeks with ob transvaginal · 14 of 99 slices shown]
[im 4/99]
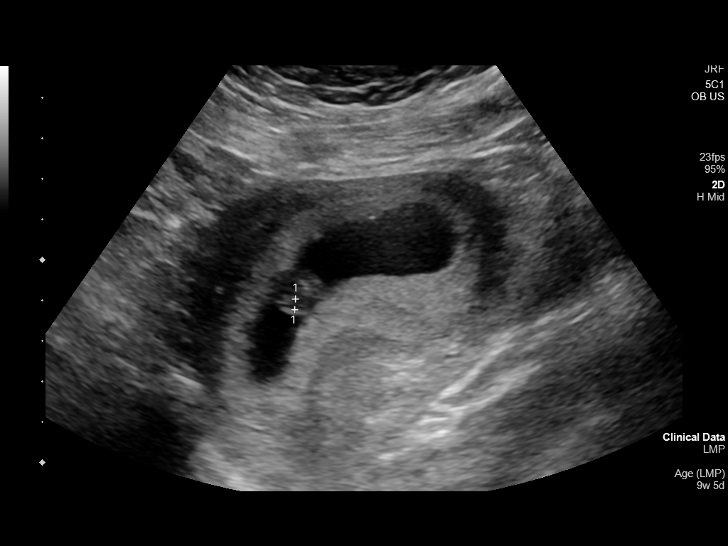
[im 11/99]
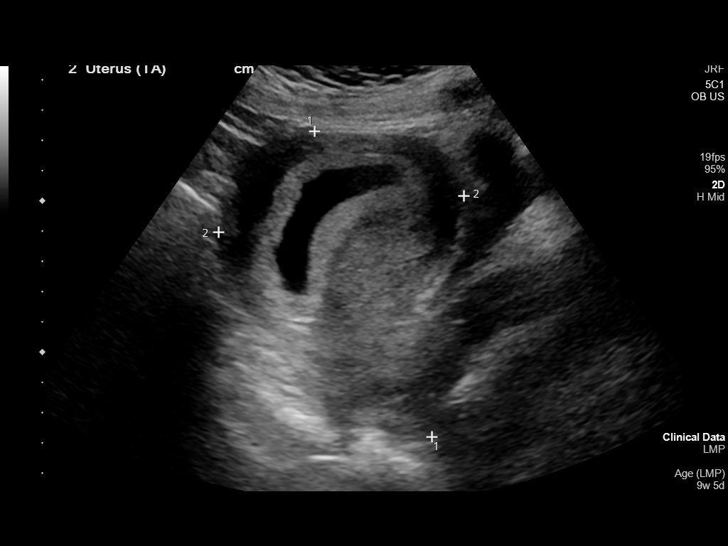
[im 19/99]
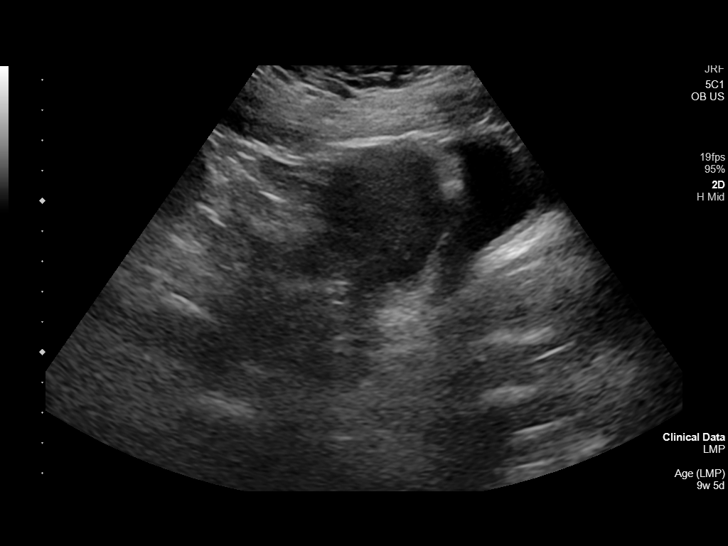
[im 26/99]
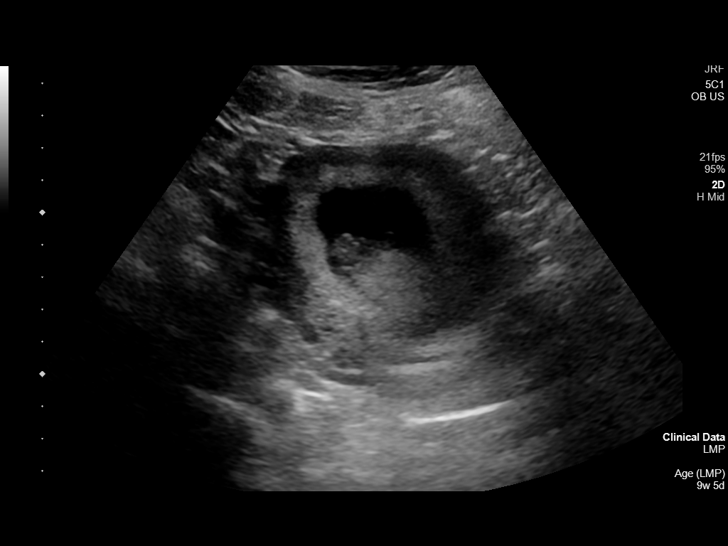
[im 33/99]
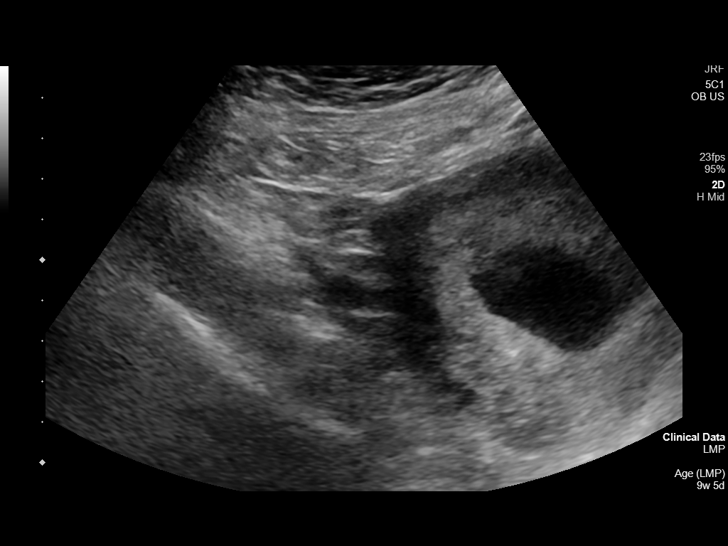
[im 40/99]
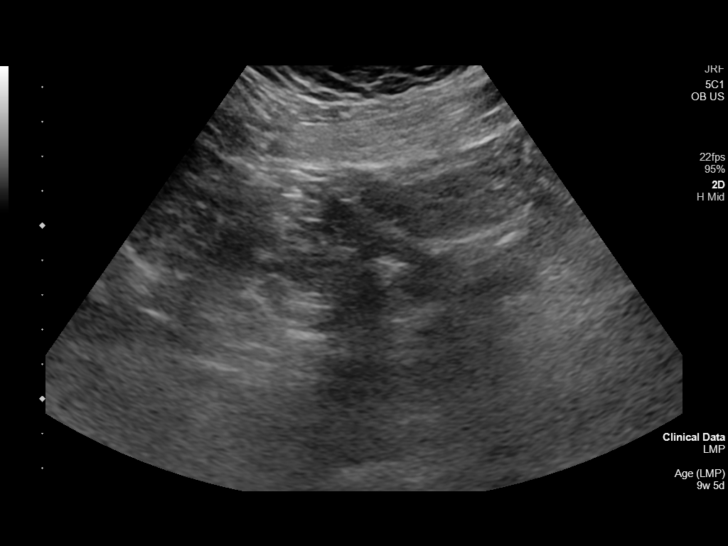
[im 48/99]
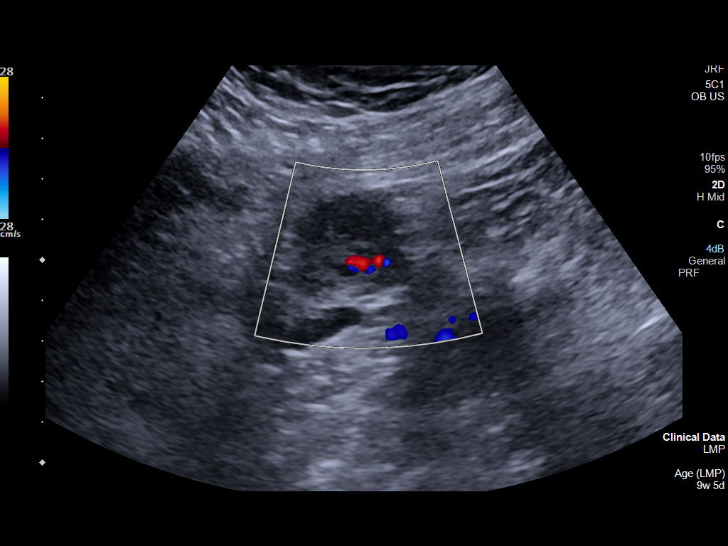
[im 55/99]
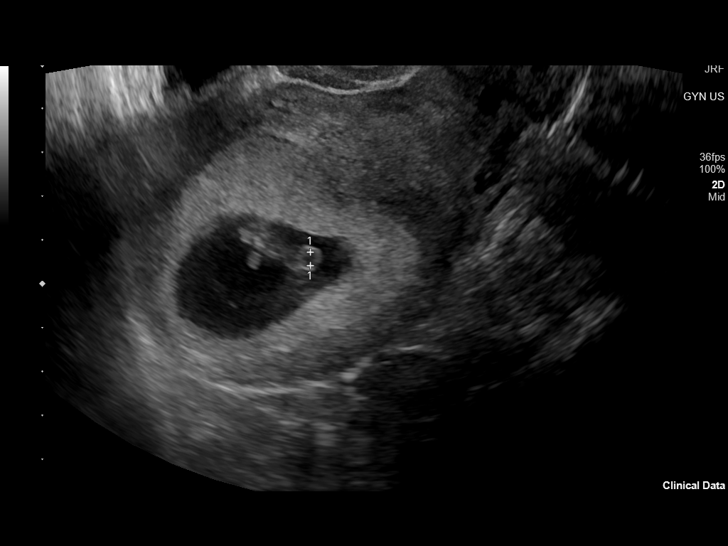
[im 62/99]
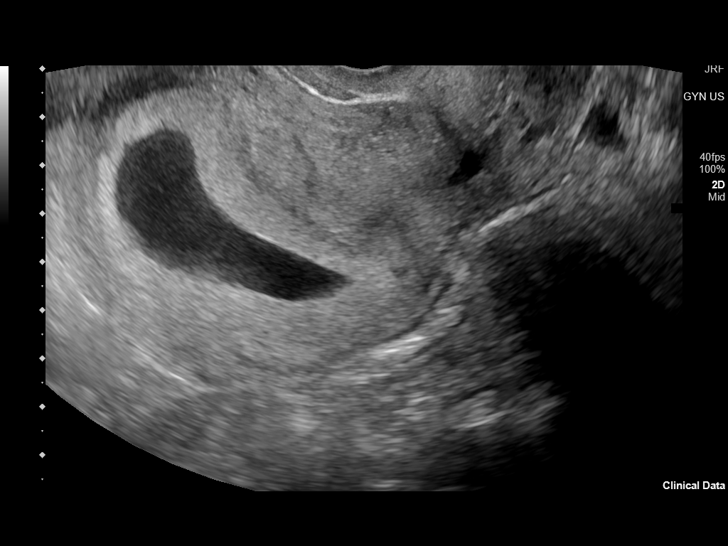
[im 69/99]
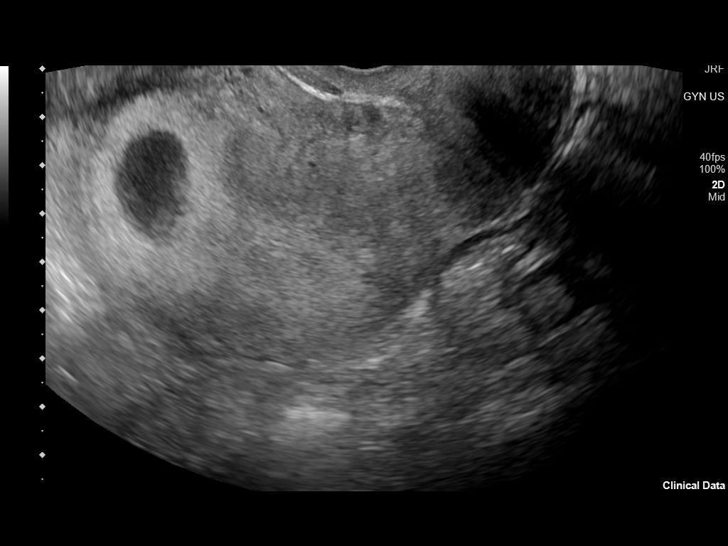
[im 77/99]
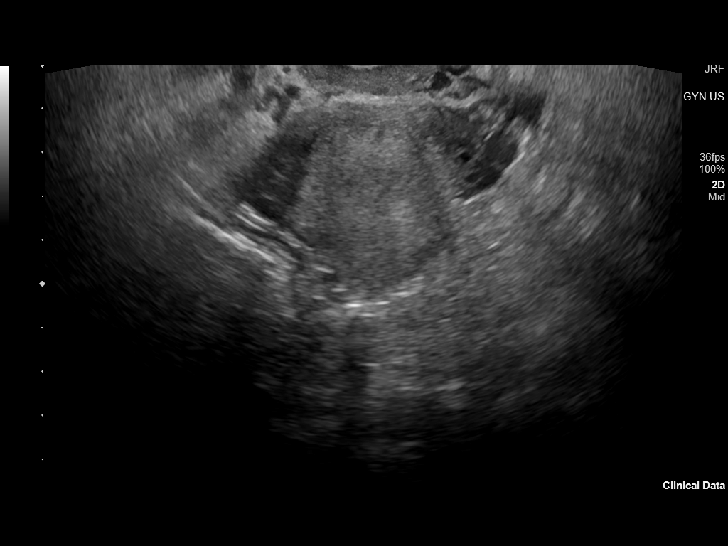
[im 84/99]
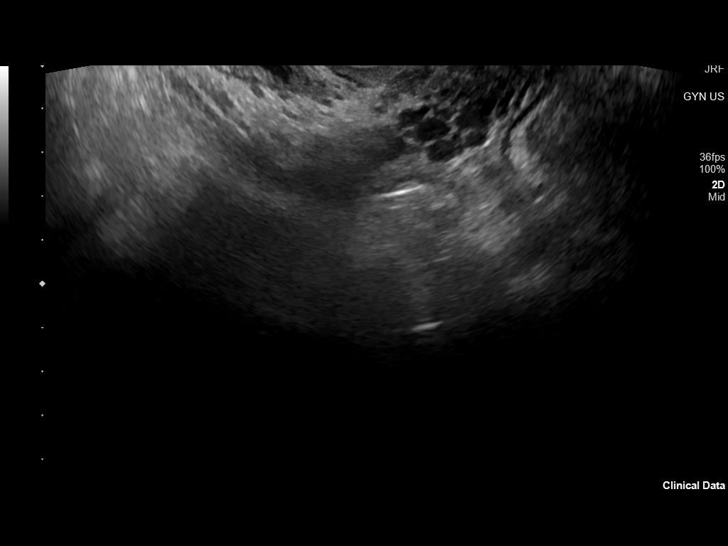
[im 91/99]
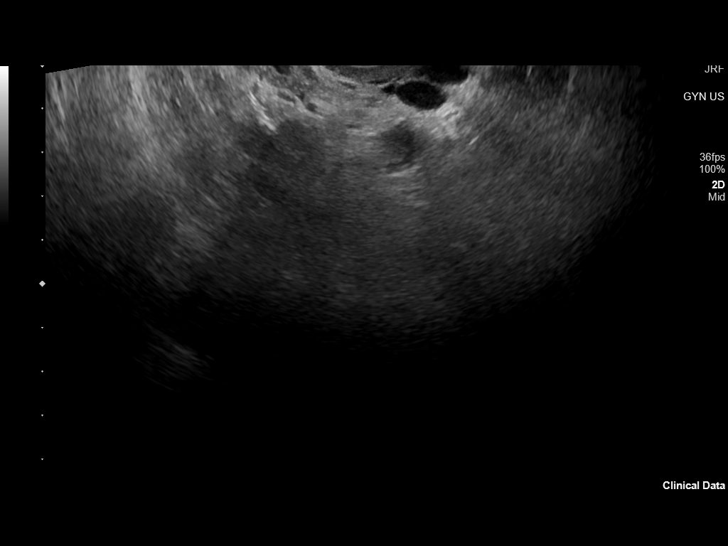
[im 99/99]
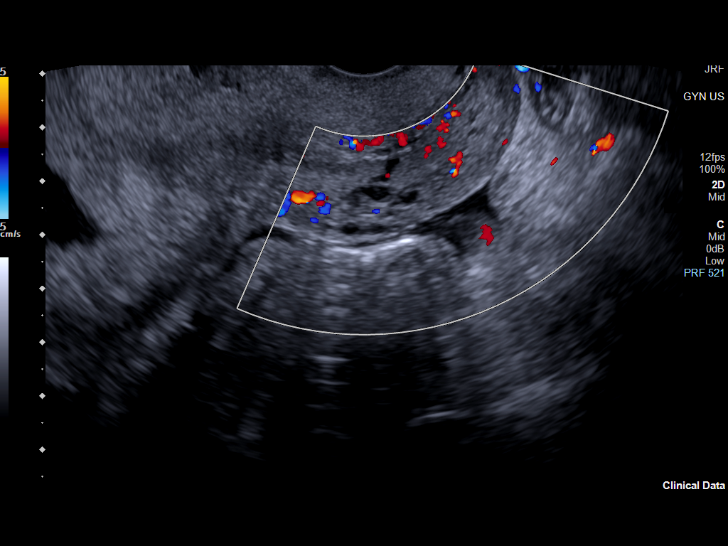

[14 of 28 positions shown; findings below may reference images not displayed]

FINDINGS: Intrauterine gestational sac: Present

Yolk sac:  Present

Embryo:  Present

Cardiac Activity: Present

Heart Rate: 166 bpm

CRL:  25.2 mm   9 w   2 d                  US EDC: [DATE]

Subchorionic hemorrhage:  None visualized.

Maternal uterus/adnexae:

Uterus anteverted, otherwise unremarkable.

LEFT ovary normal size and morphology, 3.3 x 2.6 x 2.6 cm.

RIGHT ovary normal size and morphology, 1.5 x 2.1 x 1.8 cm.

Trace free pelvic fluid.

No adnexal masses.
IMPRESSION: Single live intrauterine gestation at 9 weeks 2 days EGA by
crown-rump length.

No acute abnormalities.

## 2020-08-26 ENCOUNTER — Ambulatory Visit (INDEPENDENT_AMBULATORY_CARE_PROVIDER_SITE_OTHER): Payer: BC Managed Care – PPO | Admitting: Obstetrics

## 2020-08-26 VITALS — BP 100/70 | Wt 203.0 lb

## 2020-08-26 DIAGNOSIS — Z23 Encounter for immunization: Secondary | ICD-10-CM | POA: Diagnosis not present

## 2020-08-26 DIAGNOSIS — O0990 Supervision of high risk pregnancy, unspecified, unspecified trimester: Secondary | ICD-10-CM

## 2020-08-26 DIAGNOSIS — R110 Nausea: Secondary | ICD-10-CM

## 2020-08-26 DIAGNOSIS — K219 Gastro-esophageal reflux disease without esophagitis: Secondary | ICD-10-CM

## 2020-08-26 DIAGNOSIS — Z3A09 9 weeks gestation of pregnancy: Secondary | ICD-10-CM

## 2020-08-26 LAB — POCT URINALYSIS DIPSTICK OB
Glucose, UA: NEGATIVE
POC,PROTEIN,UA: NEGATIVE

## 2020-08-26 MED ORDER — PANTOPRAZOLE SODIUM 40 MG PO TBEC: DELAYED_RELEASE_TABLET | ORAL | 1 refills | Status: DC

## 2020-08-26 MED ORDER — PROMETHAZINE HCL 25 MG PO TABS: 25.0000 mg | ORAL_TABLET | Freq: Three times a day (TID) | ORAL | 0 refills | Status: DC | PRN

## 2020-08-26 NOTE — Progress Notes (Signed)
  Routine Prenatal Care Visit  Subjective  Brenda [name] is a 40 y.o. G3P1011 at [redacted]w[redacted]d being seen today for ongoing prenatal care.  She is currently monitored for the following issues for this high-risk pregnancy and has Adult ADHD; Blood in stool; Rectal polyp; Heartburn; Nausea; Epigastric abdominal pain; Abdominal pain; Gastroesophageal reflux disease; Essential hypertension; Performance anxiety; Supervision of high risk pregnancy, antepartum; Antepartum multigravida of advanced maternal age; Maternal obesity, antepartum; and Pre-existing essential hypertension during pregnancy, antepartum on their problem list.  ----------------------------------------------------------------------------------- Patient reports no complaints.  She just had her dating scan at [facility] yesterday, which confirms she is 9 weeks 3 days gestation today.  .  .   Pincus Large Fluid denies.  ----------------------------------------------------------------------------------- The following portions of the patient's history were reviewed and updated as appropriate: allergies, current medications, past family history, past medical history, past social history, past surgical history and problem list. Problem list updated.  Objective  Blood pressure 100/70, weight 203 lb (92.1 kg), last menstrual period 06/18/2020. Pregravid weight 190 lb (86.2 kg) Total Weight Gain 13 lb (5.897 kg) Urinalysis: Urine Protein Negative  Urine Glucose Negative  Fetal Status:           General:  Alert, oriented and cooperative. Patient is in no acute distress.  Skin: Skin is warm and dry. No rash noted.   Cardiovascular: Normal heart rate noted  Respiratory: Normal respiratory effort, no problems with respiration noted  Abdomen: Soft, gravid, appropriate for gestational age.       Pelvic:  Cervical exam deferred        Extremities: Normal range of motion.  Edema: None  Mental Status: Normal mood and affect. Normal behavior. Normal judgment  and thought content.   Assessment   40 y.o. G3P1011 at [redacted]w[redacted]d by  03/25/2021, by Last Menstrual Period presenting for routine prenatal visit  Plan   pregnancy 3 Problems (from 08/05/20 to present)    No problems associated with this episode.       Preterm labor symptoms and general obstetric precautions including but not limited to vaginal bleeding, contractions, leaking of fluid and fetal movement were reviewed in detail with the patient. Please refer to After Visit Summary for other counseling recommendations.   Return in about 4 weeks (around 09/23/2020) for return OB.  Mirna Mires, CNM  08/26/2020 1:50 PM

## 2020-08-26 NOTE — Progress Notes (Signed)
ROB- flu shot today, no concerns 

## 2020-08-26 NOTE — Addendum Note (Signed)
Addended by: Mirna Mires on: 08/26/2020 02:22 PM   Modules accepted: Orders

## 2020-09-01 ENCOUNTER — Telehealth: Payer: Self-pay | Admitting: Cardiology

## 2020-09-01 DIAGNOSIS — I472 Ventricular tachycardia, unspecified: Secondary | ICD-10-CM

## 2020-09-01 DIAGNOSIS — I442 Atrioventricular block, complete: Secondary | ICD-10-CM

## 2020-09-01 NOTE — Telephone Encounter (Signed)
Discussed with Dr. Azucena Cecil. Patient needs an urgent EP referral and she needs to stop labetalol.  Attempted to contact the patient again. Lmtcb.  DPR on file. Called and spoke with the patients husband. Adv him that we have been trying to get in contact with the patient Regarding her zio monitor results and medication changes.  He will have the patient call our office.

## 2020-09-01 NOTE — Telephone Encounter (Addendum)
Incoming call to triage. Critical i[employer] reporting. Emily @ [employer] calling.  Patient wore a zio XT from 2/15-08/24/20.  Patient had 3 episodes of complete heart block. 3 pauses. The longest duration 11.7 seconds. 1 run of Vtach for 7 beats max HR 210bpm.  Full monitor report is being uploaded now for review.

## 2020-09-01 NOTE — Telephone Encounter (Signed)
Spoke with the patient. She has not had any reccurrence of syncope. She does some time get a lightheaded when changing positions.  Advised her that we have reviewed her zio report and several pauses were noted. Advised her of Dr. Merita Norton recommendations. Stop Labetalol. Consult with EP asap. Consult scheduled with Dr. Graciela Husbands for 09/02/20 @ 10 am. Patient is aware of the appt, date, time and location.  Patient verbalized understanding and voiced appreciation for the assistance.

## 2020-09-01 NOTE — Telephone Encounter (Signed)
Called to check on the patient. lmtcb.

## 2020-09-01 NOTE — Telephone Encounter (Signed)
[employer] calling with abnormal reading from monitor

## 2020-09-02 ENCOUNTER — Encounter: Payer: Self-pay | Admitting: Internal Medicine

## 2020-09-02 ENCOUNTER — Other Ambulatory Visit: Payer: Self-pay

## 2020-09-02 ENCOUNTER — Ambulatory Visit (INDEPENDENT_AMBULATORY_CARE_PROVIDER_SITE_OTHER): Payer: BC Managed Care – PPO | Admitting: Internal Medicine

## 2020-09-02 VITALS — BP 128/70 | HR 69 | Ht 64.0 in | Wt 209.1 lb

## 2020-09-02 DIAGNOSIS — I442 Atrioventricular block, complete: Secondary | ICD-10-CM | POA: Diagnosis not present

## 2020-09-02 DIAGNOSIS — I472 Ventricular tachycardia, unspecified: Secondary | ICD-10-CM

## 2020-09-02 NOTE — Progress Notes (Signed)
ELECTROPHYSIOLOGY CONSULT NOTE  Patient ID: Brenda [name], MRN: [id number], DOB/AGE: 1981-06-01 40 y.o. Admit date: (Not on file) Date of Consult: 09/02/2020  Primary Physician: Duanne Limerick, MD Primary Cardiologist: BAE     Brenda [name] is a 40 y.o. female who is being seen today for the evaluation of pauses on the monitor at the request of BAE.    HPI Brenda [name] is a 40 y.o. female referred because of an abnormal monitor.  Has a history of Syncope preceded by a somewhat stereotypical prodrome, triggered by anxiety.  She found herself on the floor this was some years ago.  She may well have been pregnant at this time.    Also has had problems with dizziness preceding her recent identification of being pregnant, now about 10 weeks, which prompted a monitor referral not infrequently orthostatic  Anxiety triggered other spells associated with hand spasms with perioral paresthesias and hyperventilation, occurring in any number of situations gradually abates that she tries to slow her breathing down.  Has been described along the way to hyperventilation.  She is now pregnant as noted.  She was wearing a event recorder which was sent in and just recently reviewed demonstrating particularly on 23 February a series of bradycardia arrhythmic events associated with gradual PR prolongation concurrent with sinus node slowing and with pauses which on some occasions are associated with intermittent complete heart block.  This day was particularly bad for morning sickness.  She recalls no syncope.  She was having vomiting.  Also history of sleep apnea although this is been better since she lost 40 pounds over the last couple of years.  Her episodes have a relatively stereotypical prodrome is noted but are not associated with flushing nausea or diaphoresis.  History of hypertension previous of hydrochlorothiazide.  With her pregnancy with transition to labetalol.  With the  bradycardia it was stopped.  Echo pending Date Cr K Hgb  2/22 0.68 3.6 15.5         Takes Adderall for adult ADD  Has had debilitating anxiety at different times.  Past Medical History:  Diagnosis Date  . ADHD, adult residual type   . Anxiety   . Depression   . GERD (gastroesophageal reflux disease)   . Hypertension   . Wears contact lenses       Surgical History:  Past Surgical History:  Procedure Laterality Date  . BREAST REDUCTION SURGERY    . COLONOSCOPY WITH PROPOFOL N/A 11/09/2016   Procedure: COLONOSCOPY WITH PROPOFOL;  Surgeon: [name] Minium, MD;  Location: [facility];  Service: Endoscopy;  Laterality: N/A;  . ESOPHAGOGASTRODUODENOSCOPY (EGD) WITH PROPOFOL N/A 11/09/2016   Procedure: ESOPHAGOGASTRODUODENOSCOPY (EGD) WITH PROPOFOL;  Surgeon: [name] Minium, MD;  Location: [facility];  Service: Endoscopy;  Laterality: N/A;  . TONSILLECTOMY       Home Meds: Current Meds  Medication Sig  . amphetamine-dextroamphetamine (ADDERALL XR) 20 MG 24 hr capsule Take 20 mg by mouth daily as needed.  . Doxylamine-Pyridoxine (DICLEGIS) 10-10 MG TBEC Take 2 tablets by mouth at bedtime. If symptoms persist, add one tablet in the morning and one in the afternoon  . pantoprazole (PROTONIX) 40 MG tablet Take 1 tablet once a day  . promethazine (PHENERGAN) 25 MG tablet Take 1 tablet (25 mg total) by mouth every 8 (eight) hours as needed for nausea or vomiting.  . traZODone (DESYREL) 100 MG tablet Take 200 mg by mouth at bedtime.  Allergies:  Allergies  Allergen Reactions  . Ace Inhibitors Swelling    Lips/ face    Social History   Socioeconomic History  . Marital status: Married    Spouse name: Not on file  . Number of children: Not on file  . Years of education: Not on file  . Highest education level: Not on file  Occupational History  . Not on file  Tobacco Use  . Smoking status: Never Smoker  . Smokeless tobacco: Never Used  Vaping Use  . Vaping Use:  Never used  Substance and Sexual Activity  . Alcohol use: Yes    Alcohol/week: 10.0 standard drinks    Types: 5 Glasses of wine, 5 Cans of beer per week  . Drug use: No  . Sexual activity: Yes    Birth control/protection: None  Other Topics Concern  . Not on file  Social History Narrative  . Not on file   Social Determinants of Health   Financial Resource Strain: Not on file  Food Insecurity: Not on file  Transportation Needs: Not on file  Physical Activity: Not on file  Stress: Not on file  Social Connections: Not on file  Intimate Partner Violence: Not on file     Family History  Problem Relation Age of Onset  . Heart disease Maternal Grandmother   . Heart disease Maternal Grandfather   . Diabetes Paternal Grandfather   . Hypertension Mother   . Hypertension Father      ROS:  Please see the history of present illness.     All other systems reviewed and negative.    Physical Exam: Blood pressure 128/70, pulse 69, height 5\' 4"  (1.626 m), weight 209 lb 2 oz (94.9 kg), last menstrual period 06/18/2020, SpO2 99 %. General: Well developed, well nourished female in no acute distress. Head: Normocephalic, atraumatic, sclera non-icteric, no xanthomas, nares are without discharge. EENT: normal  Lymph Nodes:  none Neck: Negative for carotid bruits. JVD not elevated. Back:without scoliosis kyphosis Lungs: Clear bilaterally to auscultation without wheezes, rales, or rhonchi. Breathing is unlabored. Heart: RRR with S1 S2. No murmur . No rubs, or gallops appreciated. Abdomen: Soft, non-tender, non-distended with normoactive bowel sounds. No hepatomegaly. No rebound/guarding. No obvious abdominal masses. Msk:  Strength and tone appear normal for age. Extremities: No clubbing or cyanosis. No  edema.  Distal pedal pulses are 2+ and equal bilaterally. Skin: Warm and Dry Neuro: Alert and oriented X 3. CN III-XII intact Grossly normal sensory and motor function . Psych:  Responds to  questions appropriately with a normal affect.      Labs: Cardiac Enzymes No results for input(s): CKTOTAL, CKMB, TROPONINI in the last 72 hours. CBC Lab Results  Component Value Date   WBC 14.4 (H) 08/05/2020   HGB 15.5 08/05/2020   HCT 45.0 08/05/2020   MCV 86 08/05/2020   PLT 390 08/05/2020   PROTIME: No results for input(s): LABPROT, INR in the last 72 hours. Chemistry No results for input(s): NA, K, CL, CO2, BUN, CREATININE, CALCIUM, PROT, BILITOT, ALKPHOS, ALT, AST, GLUCOSE in the last 168 hours.  Invalid input(s): LABALBU Lipids No results found for: CHOL, HDL, LDLCALC, TRIG BNP No results found for: PROBNP Thyroid Function Tests: No results for input(s): TSH, T4TOTAL, T3FREE, THYROIDAB in the last 72 hours.  Invalid input(s): FREET3 Miscellaneous No results found for: DDIMER  Radiology/Studies:  10/03/2020 OB LESS THAN 14 WEEKS WITH OB TRANSVAGINAL  Result Date: 08/25/2020 CLINICAL DATA:  First trimester pregnancy, dating  EXAM: OBSTETRIC <14 WK [place] AND TRANSVAGINAL OB US TECHNIQUE: Both transabdominal and transvaginal ultrasound examinations were performed for complete evaluation of the gestation as well as the maternal uterus, adnexal regions, and pelvic cul-de-sac. Transvaginal technique was performed to assess early pregnancy. COMPARISON:  None FINDINGS: Intrauterine gestational sac: Present Yolk sac:  Present Embryo:  Present Cardiac Activity: Present Heart Rate: 166 bpm CRL:  25.2 mm   9 w   2 d                  [place] EDC: 03/28/2021 Subchorionic hemorrhage:  None visualized. Maternal uterus/adnexae: Uterus anteverted, otherwise unremarkable. LEFT ovary normal size and morphology, 3.3 x 2.6 x 2.6 cm. RIGHT ovary normal size and morphology, 1.5 x 2.1 x 1.8 cm. Trace free pelvic fluid. No adnexal masses. IMPRESSION: Single live intrauterine gestation at 9 weeks 2 days EGA by crown-rump length. No acute abnormalities. Electronically Signed   By: Ulyses Southward M.D.   On: 08/25/2020 15:22     EKG: Sinus at 69 intervals 16/07/40 Otherwise normal apart from low voltage   Assessment and Plan:  Ventricular tachycardia-nonsustained  Pauses associated with simultaneous sinus slowing and AV nodal conduction, first-degree AV block giving rise to complete heart block  Pregnancy with significant hyperemesis  Anxiety  Hypertension  Orthostatic dizziness   The patient had a monitor placed for a presyncopal episode associated with orthostatic lightheadedness as well as remote history of syncope.  Occurring in the context of anxiety could have been neurally mediated.  Has had repeated episodes associated with anxiety suggestive of the same.  Also has spells that are distinct with hand spasms and perioral paresthesias to suggest hyperventilation  Of note today was a monitor-XT which demonstrated 2 abnormalities.  The first was nonsustained ventricular tachycardia unassociated with symptoms-7 beats more striking were pauses that occurred on a day associated with significant hyperemesis and unassociated with syncope.  The strips demonstrate simultaneous effects on the sinus node and AV node to suggest hyper vagotonia as an extrinsic cause as opposed to an intrinsic issue with her heart.  Nonsustained ventricular tachycardia is more concerning in terms of her cardiac issue particularly in light of the low voltage and will require further evaluation.  Her pregnancy right now makes this difficult.  An echocardiogram was ordered.  We will also undertake signal-averaged ECG to look for evidence of late potentials.  Depending on the findings above, further evaluation will be deferred to the end of her pregnancy which would include a cMRI potentially.  Hyperventilation and management of anxiety will be an important part of health care going forward.       Sherryl Manges

## 2020-09-02 NOTE — Patient Instructions (Signed)
Medication Instructions:  - Your physician recommends that you continue on your current medications as directed. Please refer to the Current Medication list given to you today.  *If you need a refill on your cardiac medications before your next appointment, please call your pharmacy*   Lab Work: - none ordered  If you have labs (blood work) drawn today and your tests are completely normal, you will receive your results only by: Marland Kitchen MyChart Message (if you have MyChart) OR . A paper copy in the mail If you have any lab test that is abnormal or we need to change your treatment, we will call you to review the results.   Testing/Procedures: 1) Echocardiogram - ASAP (as ordered by Dr. Azucena Cecil)  2) Signal Averaged EKG: Wednesday 09/08/20 (arrive at 9:45 am) [facility] (off [facility]) Entrance C (you will need to use the Valet parking)   Follow-Up: At [facility], you and your health needs are our priority.  As part of our continuing mission to provide you with exceptional heart care, we have created designated Provider Care Teams.  These Care Teams include your primary Cardiologist (physician) and Advanced Practice Providers (APPs -  Physician Assistants and Nurse Practitioners) who all work together to provide you with the care you need, when you need it.  We recommend signing up for the patient portal called "MyChart".  Sign up information is provided on this After Visit Summary.  MyChart is used to connect with patients for Virtual Visits (Telemedicine).  Patients are able to view lab/test results, encounter notes, upcoming appointments, etc.  Non-urgent messages can be sent to your provider as well.   To learn more about what you can do with MyChart, go to [email or web address].    Your next appointment:   6 month(s)  The format for your next appointment:   In Person  Provider:   Sherryl Manges, MD   Other Instructions n/a

## 2020-09-08 ENCOUNTER — Ambulatory Visit (HOSPITAL_COMMUNITY)
Admission: RE | Admit: 2020-09-08 | Discharge: 2020-09-08 | Disposition: A | Discharge status: Home / Self Care | Payer: BC Managed Care – PPO | Source: Ambulatory Visit | Attending: Internal Medicine | Admitting: Internal Medicine

## 2020-09-08 ENCOUNTER — Encounter: Payer: BC Managed Care – PPO | Admitting: Obstetrics and Gynecology

## 2020-09-08 ENCOUNTER — Other Ambulatory Visit: Payer: Self-pay

## 2020-09-08 DIAGNOSIS — I517 Cardiomegaly: Secondary | ICD-10-CM | POA: Diagnosis not present

## 2020-09-08 DIAGNOSIS — I472 Ventricular tachycardia, unspecified: Secondary | ICD-10-CM

## 2020-09-08 DIAGNOSIS — I442 Atrioventricular block, complete: Secondary | ICD-10-CM

## 2020-09-09 ENCOUNTER — Ambulatory Visit (INDEPENDENT_AMBULATORY_CARE_PROVIDER_SITE_OTHER): Payer: BC Managed Care – PPO | Admitting: Obstetrics & Gynecology

## 2020-09-09 ENCOUNTER — Ambulatory Visit (INDEPENDENT_AMBULATORY_CARE_PROVIDER_SITE_OTHER): Payer: BC Managed Care – PPO | Admitting: Advanced Practice Midwife

## 2020-09-09 ENCOUNTER — Encounter: Payer: Self-pay | Admitting: Obstetrics & Gynecology

## 2020-09-09 VITALS — BP 138/88 | Wt 210.0 lb

## 2020-09-09 DIAGNOSIS — Z3491 Encounter for supervision of normal pregnancy, unspecified, first trimester: Secondary | ICD-10-CM

## 2020-09-09 DIAGNOSIS — Z3A11 11 weeks gestation of pregnancy: Secondary | ICD-10-CM

## 2020-09-09 DIAGNOSIS — Z369 Encounter for antenatal screening, unspecified: Secondary | ICD-10-CM

## 2020-09-09 DIAGNOSIS — R110 Nausea: Secondary | ICD-10-CM

## 2020-09-09 DIAGNOSIS — O0991 Supervision of high risk pregnancy, unspecified, first trimester: Secondary | ICD-10-CM | POA: Diagnosis not present

## 2020-09-09 DIAGNOSIS — O10019 Pre-existing essential hypertension complicating pregnancy, unspecified trimester: Secondary | ICD-10-CM

## 2020-09-09 DIAGNOSIS — O99340 Other mental disorders complicating pregnancy, unspecified trimester: Secondary | ICD-10-CM

## 2020-09-09 DIAGNOSIS — O09529 Supervision of elderly multigravida, unspecified trimester: Secondary | ICD-10-CM | POA: Diagnosis not present

## 2020-09-09 DIAGNOSIS — F419 Anxiety disorder, unspecified: Secondary | ICD-10-CM

## 2020-09-09 MED ORDER — PROMETHAZINE HCL 25 MG PO TABS
25.0000 mg | ORAL_TABLET | Freq: Two times a day (BID) | ORAL | 0 refills | Status: DC | PRN
Start: 2020-09-09 — End: 2020-10-06

## 2020-09-09 MED ORDER — HYDROXYZINE HCL 25 MG PO TABS: 25.0000 mg | ORAL_TABLET | Freq: Three times a day (TID) | ORAL | 2 refills | Status: DC | PRN

## 2020-09-09 NOTE — Progress Notes (Signed)
ULTRASOUND REPORT  Location: [facility] Date of Service: 09/09/2020   Indications:Pt at 11 weeks and unable to auscultate FHTs in office.  Pt has a h/o miscarriage, and has anxiety with this early pregnancy.  [place] 2 weeks ago identified IUP and FHT.  No fetal movements, no pain or bleeding. Findings:  Singleton intrauterine pregnancy is visualized with a CRL consistent with [redacted]w[redacted]d gestation, giving an (U/S) EDD of 03/25/21. The (U/S) EDD is consistent with the clinically established EDD of 03/25/21.  FHR: 150 BPM Yolk sac is not visualized. Amnion: visualized and appears normal   Survey of the adnexa demonstrates no adnexal masses. There is no free peritoneal fluid in the cul de sac.  Impression: 1. [redacted]w[redacted]d Viable Singleton Intrauterine pregnancy by U/S. 2. (U/S) EDD is consistent with Clinically established EDD of 03/25/21.  Recommendations: 1.Clinical correlation with the patient's History and Physical Exam. 2. Patient is reassured by findings today  Letitia Libra, MD

## 2020-09-09 NOTE — Progress Notes (Signed)
  Routine Prenatal Care Visit  Subjective  Brenda [name] is a 40 y.o. G3P1011 at [redacted]w[redacted]d being seen today for ongoing prenatal care.  She is currently monitored for the following issues for this high-risk pregnancy and has Adult ADHD; Blood in stool; Rectal polyp; Heartburn; Nausea; Epigastric abdominal pain; Abdominal pain; Gastroesophageal reflux disease; Essential hypertension; Performance anxiety; Supervision of high risk pregnancy, antepartum; Antepartum multigravida of advanced maternal age; Maternal obesity, antepartum; and Pre-existing essential hypertension during pregnancy, antepartum on their problem list.  ----------------------------------------------------------------------------------- Patient reports anxiety and concern regarding discontinuation of BP med (labetalol) by Dr Graciela Husbands. He had concern for irregular heart beat, heart pauses, ventricular tachycardia based on 2 week monitor. She has Echo scheduled for tomorrow. She requests anti-anxiety medication and a refill of phenergan for nausea/vomiting. She does have headaches- she describes as end of day tension type and some dizziness after vomiting. She uses some coping techniques for anxiety. I was unable to hear FHTs by doppler and unable to use the [place] office ultrasound which caused an increase in anxiety. Apologies given. Will do MaterniT 21 here and then go to [facility] to see Dr Tiburcio Pea.    . Vag. Bleeding: None.   . Leaking Fluid denies.  ----------------------------------------------------------------------------------- The following portions of the patient's history were reviewed and updated as appropriate: allergies, current medications, past family history, past medical history, past social history, past surgical history and problem list. Problem list updated.  Objective  Blood pressure 138/88, weight 210 lb (95.3 kg), last menstrual period 06/18/2020. Pregravid weight 190 lb (86.2 kg) Total Weight Gain 20 lb (9.072  kg) Urinalysis: Urine Protein    Urine Glucose    Fetal Status:           General:  Alert, oriented and cooperative. Patient is in no acute distress.  Skin: Skin is warm and dry. No rash noted.   Cardiovascular: Normal heart rate noted  Respiratory: Normal respiratory effort, no problems with respiration noted  Abdomen: Soft, gravid, appropriate for gestational age. Pain/Pressure: Absent     Pelvic:  Cervical exam deferred        Extremities: Normal range of motion.     Mental Status: Normal mood and affect. Normal behavior. Normal judgment and thought content.   Assessment   40 y.o. G3P1011 at [redacted]w[redacted]d by  03/25/2021, by Last Menstrual Period presenting for routine prenatal visit  Plan    Preterm labor symptoms and general obstetric precautions including but not limited to vaginal bleeding, contractions, leaking of fluid and fetal movement were reviewed in detail with the patient.  MaterniT 21 today  Go to [facility] office to be seen by Dr Tiburcio Pea for fetal heart rate evaluation.  Return in about 4 weeks (around 10/07/2020) for rob.  Tresea Mall, CNM 09/09/2020 10:30 AM

## 2020-09-09 NOTE — Progress Notes (Signed)
No vb. No lof. Went to cardio and going tomorrow for echo. Wore heart monitor for 2 weeks. Took her off BP meds, really anxious about that

## 2020-09-10 ENCOUNTER — Other Ambulatory Visit: Payer: Self-pay

## 2020-09-10 ENCOUNTER — Other Ambulatory Visit: Payer: BC Managed Care – PPO

## 2020-09-10 ENCOUNTER — Ambulatory Visit (INDEPENDENT_AMBULATORY_CARE_PROVIDER_SITE_OTHER): Payer: BC Managed Care – PPO

## 2020-09-10 DIAGNOSIS — R55 Syncope and collapse: Secondary | ICD-10-CM

## 2020-09-10 LAB — ECHOCARDIOGRAM COMPLETE
AR max vel: 2.66 cm2
AV Area VTI: 2.67 cm2
AV Area mean vel: 2.47 cm2
AV Mean grad: 6 mmHg
AV Peak grad: 10.4 mmHg
Ao pk vel: 1.61 m/s
Area-P 1/2: 4.6 cm2
Calc EF: 63.3 %
S' Lateral: 2.8 cm
Single Plane A2C EF: 63.5 %
Single Plane A4C EF: 61.4 %

## 2020-09-13 ENCOUNTER — Telehealth: Payer: Self-pay | Admitting: Cardiology

## 2020-09-13 LAB — MATERNIT21 PLUS CORE+SCA
Fetal Fraction: 7
Monosomy X (Turner Syndrome): NOT DETECTED
Result (T21): NEGATIVE
Trisomy 13 (Patau syndrome): NEGATIVE
Trisomy 18 (Edwards syndrome): NEGATIVE
Trisomy 21 (Down syndrome): NEGATIVE
XXX (Triple X Syndrome): NOT DETECTED
XXY (Klinefelter Syndrome): NOT DETECTED
XYY (Jacobs Syndrome): NOT DETECTED

## 2020-09-13 NOTE — Telephone Encounter (Signed)
I spoke with the patient regarding her echo results.  The patient voices understanding of these results.  She then inquired about what the interpretation is on why her EKG's are not coming back quite normal. I have advised her that sometimes these are non-specific findings, but she did have a signal averaged EKG done on 09/08/20 that is waiting for review by Dr. Graciela Husbands.  I have advised her I will try to have him look at this in clinic tomorrow.   She also inquired if she should stay off of her labatelol as she has been on this some time for high blood pressure.  This was stopped on 09/01/20 by Dr. Azucena Cecil when the patient had abnormal findings on her heart monitor report.   The patient was previously taking labetolol 200 mg BID, but her HR would also drop down to 55 bpm.  She is currently feeling good. She does not check her BP at home on purpose as to not get too anxious about the readings.   Her last BP check was on 09/09/20 at her OB-GYN office: ~ 130/83.  I advised her I will review this message with Dr. Graciela Husbands tomorrow and call her back as soon as I can with her signal averaged EKG results and further MD recommendations.   The patient voices understanding and is agreeable.

## 2020-09-13 NOTE — Telephone Encounter (Signed)
Debbe Odea, MD  09/13/2020 10:06 AM EDT      Normal systolic and diastolic function, normal echocardiogram, no findings to suggest etiology of syncope.

## 2020-09-14 ENCOUNTER — Other Ambulatory Visit: Payer: Self-pay | Admitting: Family Medicine

## 2020-09-14 DIAGNOSIS — I10 Essential (primary) hypertension: Secondary | ICD-10-CM

## 2020-09-14 NOTE — Telephone Encounter (Signed)
Patient has been prescribed through another doctor (gyn) on a different medicine.

## 2020-09-14 NOTE — Telephone Encounter (Signed)
   Notes to clinic:  medication is not on the current medication list Medication has been d/c     Requested Prescriptions  Pending Prescriptions Disp Refills   hydrochlorothiazide (HYDRODIURIL) 25 MG tablet [Pharmacy Med Name: HYDROCHLOROTHIAZIDE 25 MG TAB] 30 tablet 0    Sig: TAKE 1 TABLET BY MOUTH EVERY DAY *NEEDS APPT      Cardiovascular: Diuretics - Thiazide Failed - 09/14/2020  8:31 AM      Failed - Ca in normal range and within 360 days    Calcium  Date Value Ref Range Status  08/05/2020 10.5 (H) 8.7 - 10.2 mg/dL Final          Failed - Valid encounter within last 6 months    Recent Outpatient Visits           8 months ago Acute low back pain without sciatica, unspecified back pain laterality   [facility] Duanne Limerick, MD   11 months ago Essential hypertension   [facility] Duanne Limerick, MD   1 year ago Essential hypertension   [facility] Duanne Limerick, MD   1 year ago Essential hypertension   [facility] Duanne Limerick, MD   2 years ago Acute bacterial conjunctivitis of both eyes   [facility] Duanne Limerick, MD       Future Appointments             In 2 weeks Agbor-Etang, Arlys John, MD [facility]   In 5 months Duke Salvia, MD [facility]             Passed - Cr in normal range and within 360 days    Creatinine, Ser  Date Value Ref Range Status  08/05/2020 0.68 0.57 - 1.00 mg/dL Final          Passed - K in normal range and within 360 days    Potassium  Date Value Ref Range Status  08/05/2020 3.6 3.5 - 5.2 mmol/L Final          Passed - Na in normal range and within 360 days    Sodium  Date Value Ref Range Status  08/05/2020 138 134 - 144 mmol/L Final          Passed - Last BP in normal range    BP Readings from Last 1 Encounters:  09/09/20 138/88

## 2020-09-14 NOTE — Telephone Encounter (Signed)
Will need to get to the hospital to read ECG-Signal averaged. Or perhaps can get it faxed, no one answered the phone when I just called [phone] Thanks SK

## 2020-09-16 NOTE — Telephone Encounter (Signed)
I called and spoke with staff at [facility] [place]- [phone] to request a copy of the Signal Averaged EKG done on 09/08/20.  Per staff, they will pass this message on to the EKG techs and have them fax this over to [phone].  Awaiting fax.

## 2020-09-21 MED ORDER — LABETALOL HCL 200 MG PO TABS: 200.0000 mg | ORAL_TABLET | Freq: Two times a day (BID) | ORAL | Status: DC

## 2020-09-21 NOTE — Telephone Encounter (Signed)
I have reviewed the patient's chart again with Dr. Graciela Husbands. He was able to review the patient's signal averaged EKG at the hospital and noted this was normal. He said to please advise the patient of this and that any slight abnormalities on her previous EKG's do not appear to be of concern since the signal averaged EKG was normal. He did advise that it would be worth obtaining a Cardiac MRI post partum to evaluate her intracardiac tissue, but no urgency in this. In regards to resuming her labetalol, she is ok to resume labetalol 200 mg BID as previously taking as the bradycardia noted on her ZIO monitor was in the context of a particularly bad day of morning sickness and vomiting.  I have called and spoken with the patient and notified her of the above MD recommendations.  She voices understanding and is agreeable.   She advised she still does have labetalol at home with at least 1 refill she thinks.  She is aware we will see her at her follow up appointments as scheduled.  The patient voices understanding and is agreeable.

## 2020-09-23 DIAGNOSIS — Z03818 Encounter for observation for suspected exposure to other biological agents ruled out: Secondary | ICD-10-CM | POA: Diagnosis not present

## 2020-09-23 DIAGNOSIS — Z20822 Contact with and (suspected) exposure to covid-19: Secondary | ICD-10-CM | POA: Diagnosis not present

## 2020-09-30 ENCOUNTER — Other Ambulatory Visit: Payer: Self-pay

## 2020-09-30 ENCOUNTER — Ambulatory Visit (INDEPENDENT_AMBULATORY_CARE_PROVIDER_SITE_OTHER): Payer: BC Managed Care – PPO | Admitting: Cardiology

## 2020-09-30 ENCOUNTER — Encounter: Payer: Self-pay | Admitting: Cardiology

## 2020-09-30 VITALS — BP 110/74 | HR 63 | Ht 64.0 in | Wt 213.0 lb

## 2020-09-30 DIAGNOSIS — I10 Essential (primary) hypertension: Secondary | ICD-10-CM | POA: Diagnosis not present

## 2020-09-30 DIAGNOSIS — R55 Syncope and collapse: Secondary | ICD-10-CM

## 2020-09-30 DIAGNOSIS — Z3A14 14 weeks gestation of pregnancy: Secondary | ICD-10-CM

## 2020-09-30 NOTE — Patient Instructions (Signed)

## 2020-09-30 NOTE — Progress Notes (Signed)
Cardiology Office Note:    Date:  09/30/2020   ID:  Brenda [name], DOB Dec 26, 1980, MRN [id number]  PCP:  Duanne Limerick, MD   [facility]  Cardiologist:  Debbe Odea, MD  Advanced Practice Provider:  No care team member to display Electrophysiologist:  None       Referring MD: Duanne Limerick, MD   Chief Complaint  Patient presents with  . Other    6 week follow up - Meds reviewed verbally with patient.     History of Present Illness:    Brenda [name] is a 40 y.o. female with a hx of anxiety, hypertension, currently [redacted] weeks pregnant who presents for follow-up.  Previously seen due to syncope associated with dizziness.  The echo monitor was placed to evaluate any significant arrhythmias, echocardiogram also ordered to evaluate systolic and diastolic function.  Cardiac monitor did reveal an episode of VT's lasting 7 beats, sinus pauses and heart block noted.  Beta-blocker was held.  Apparently incident of heart block coincided with episodes of severe morning sickness.  Patient evaluated by electrophysiology, increased vagal tone was deemed likely culprit.  Due to currently being pregnant, further evaluation with CMR will be considered after pregnancy.  Beta-blocker/labetalol was restarted at 200 mg twice daily for hypertension.  HCTZ was previously stopped due to dizziness and syncope.  She denies any further episodes of syncope.  Morning stiffness is also gotten better.  Denies palpitations, chest pain or shortness of breath.  Prior notes Echo 09/10/2020 showed normal systolic and diastolic function, EF 60 to 65%.  Mild MR Cardiac monitor 09/02/2020 showed sinus pauses, one episode of VT lasting 7 beats. HCTZ was stopped due to patient being pregnant, also dizziness and syncope.   Past Medical History:  Diagnosis Date  . ADHD, adult residual type   . Anxiety   . Depression   . GERD (gastroesophageal reflux disease)   . Hypertension    . Wears contact lenses     Past Surgical History:  Procedure Laterality Date  . BREAST REDUCTION SURGERY    . COLONOSCOPY WITH PROPOFOL N/A 11/09/2016   Procedure: COLONOSCOPY WITH PROPOFOL;  Surgeon: [name] Minium, MD;  Location: [facility];  Service: Endoscopy;  Laterality: N/A;  . ESOPHAGOGASTRODUODENOSCOPY (EGD) WITH PROPOFOL N/A 11/09/2016   Procedure: ESOPHAGOGASTRODUODENOSCOPY (EGD) WITH PROPOFOL;  Surgeon: [name] Minium, MD;  Location: [facility];  Service: Endoscopy;  Laterality: N/A;  . TONSILLECTOMY      Current Medications: Current Meds  Medication Sig  . amphetamine-dextroamphetamine (ADDERALL XR) 20 MG 24 hr capsule Take 20 mg by mouth daily as needed.  . Doxylamine-Pyridoxine (DICLEGIS) 10-10 MG TBEC Take 2 tablets by mouth at bedtime. If symptoms persist, add one tablet in the morning and one in the afternoon  . hydrOXYzine (ATARAX/VISTARIL) 25 MG tablet Take 1 tablet (25 mg total) by mouth every 8 (eight) hours as needed for anxiety.  Marland Kitchen labetalol (NORMODYNE) 200 MG tablet Take 1 tablet (200 mg total) by mouth 2 (two) times daily.  . pantoprazole (PROTONIX) 40 MG tablet Take 1 tablet once a day  . promethazine (PHENERGAN) 25 MG tablet Take 1 tablet (25 mg total) by mouth every 12 (twelve) hours as needed for nausea or vomiting.  . traZODone (DESYREL) 100 MG tablet Take 200 mg by mouth at bedtime.     Allergies:   Ace inhibitors   Social History   Socioeconomic History  . Marital status: Married  Spouse name: Not on file  . Number of children: Not on file  . Years of education: Not on file  . Highest education level: Not on file  Occupational History  . Not on file  Tobacco Use  . Smoking status: Never Smoker  . Smokeless tobacco: Never Used  Vaping Use  . Vaping Use: Never used  Substance and Sexual Activity  . Alcohol use: Yes    Alcohol/week: 10.0 standard drinks    Types: 5 Glasses of wine, 5 Cans of beer per week  . Drug use: No  .  Sexual activity: Yes    Birth control/protection: None  Other Topics Concern  . Not on file  Social History Narrative  . Not on file   Social Determinants of Health   Financial Resource Strain: Not on file  Food Insecurity: Not on file  Transportation Needs: Not on file  Physical Activity: Not on file  Stress: Not on file  Social Connections: Not on file     Family History: The patient's family history includes Diabetes in her paternal grandfather; Heart disease in her maternal grandfather and maternal grandmother; Hypertension in her father and mother.  ROS:   Please see the history of present illness.     All other systems reviewed and are negative.  EKGs/Labs/Other Studies Reviewed:    The following studies were reviewed today:   EKG:  EKG is  ordered today.  The ekg ordered today demonstrates normal sinus rhythm.  Recent Labs: 08/05/2020: ALT 17; BUN 12; Creatinine, Ser 0.68; Hemoglobin 15.5; Platelets 390; Potassium 3.6; Sodium 138; TSH 1.640  Recent Lipid Panel No results found for: CHOL, TRIG, HDL, CHOLHDL, VLDL, LDLCALC, LDLDIRECT   Risk Assessment/Calculations:      Physical Exam:    VS:  BP 110/74 (BP Location: Left Arm, Patient Position: Sitting, Cuff Size: Normal)   Pulse 63   Ht 5\' 4"  (1.626 m)   Wt 213 lb (96.6 kg)   LMP 06/18/2020 (Exact Date)   SpO2 99%   BMI 36.56 kg/m     Wt Readings from Last 3 Encounters:  09/30/20 213 lb (96.6 kg)  09/09/20 210 lb (95.3 kg)  09/02/20 209 lb 2 oz (94.9 kg)     GEN:  Well nourished, well developed in no acute distress HEENT: Normal NECK: No JVD; No carotid bruits LYMPHATICS: No lymphadenopathy CARDIAC: RRR, no murmurs, rubs, gallops RESPIRATORY:  Clear to auscultation without rales, wheezing or rhonchi  ABDOMEN: Soft, non-tender, non-distended MUSCULOSKELETAL:  No edema; No deformity  SKIN: Warm and dry NEUROLOGIC:  Alert and oriented x 3 PSYCHIATRIC:  Normal affect   ASSESSMENT:    1. Syncope  and collapse   2. Primary hypertension   3. [redacted] weeks gestation of pregnancy    PLAN:    In order of problems listed above:  1. Syncope, dizziness.  Echocardiogram normal, cardiac monitor revealed 1 run of VT, sinus pauses.  Sinus pauses coincided with morning sickness as such, pauses could be secondary to increase vagal tone.  She otherwise feels well, no other episodes of syncope, dizziness noted.  May consider CMR after pregnancy to evaluate any intrinsic abnormalities due to episode of VT. 2. Hypertension, BP controlled.  Continue labetalol 200 mg twice daily. 3. [redacted] weeks pregnant, management as per OB/GYN.   Follow-up in 6 months   Medication Adjustments/Labs and Tests Ordered: Current medicines are reviewed at length with the patient today.  Concerns regarding medicines are outlined above.  Orders Placed This  Encounter  Procedures  . EKG 12-Lead   No orders of the defined types were placed in this encounter.   Patient Instructions  Medication Instructions:  Your physician recommends that you continue on your current medications as directed. Please refer to the Current Medication list given to you today. *If you need a refill on your cardiac medications before your next appointment, please call your pharmacy*   Lab Work: None ordered If you have labs (blood work) drawn today and your tests are completely normal, you will receive your results only by: Marland Kitchen MyChart Message (if you have MyChart) OR . A paper copy in the mail If you have any lab test that is abnormal or we need to change your treatment, we will call you to review the results.   Testing/Procedures: None ordered   Follow-Up: At [facility], you and your health needs are our priority.  As part of our continuing mission to provide you with exceptional heart care, we have created designated Provider Care Teams.  These Care Teams include your primary Cardiologist (physician) and Advanced Practice Providers (APPs  -  Physician Assistants and Nurse Practitioners) who all work together to provide you with the care you need, when you need it.  We recommend signing up for the patient portal called "MyChart".  Sign up information is provided on this After Visit Summary.  MyChart is used to connect with patients for Virtual Visits (Telemedicine).  Patients are able to view lab/test results, encounter notes, upcoming appointments, etc.  Non-urgent messages can be sent to your provider as well.   To learn more about what you can do with MyChart, go to [email or web address].    Your next appointment:   6 month(s)  The format for your next appointment:   In Person  Provider:   Debbe Odea, MD   Other Instructions       Signed, Debbe Odea, MD  09/30/2020 12:49 PM    [facility]

## 2020-10-02 ENCOUNTER — Other Ambulatory Visit: Payer: Self-pay | Admitting: Family Medicine

## 2020-10-02 ENCOUNTER — Other Ambulatory Visit: Payer: Self-pay | Admitting: Advanced Practice Midwife

## 2020-10-02 DIAGNOSIS — R110 Nausea: Secondary | ICD-10-CM

## 2020-10-02 DIAGNOSIS — M545 Low back pain, unspecified: Secondary | ICD-10-CM

## 2020-10-06 ENCOUNTER — Ambulatory Visit (INDEPENDENT_AMBULATORY_CARE_PROVIDER_SITE_OTHER): Payer: BC Managed Care – PPO | Admitting: Obstetrics and Gynecology

## 2020-10-06 ENCOUNTER — Other Ambulatory Visit: Payer: Self-pay

## 2020-10-06 VITALS — BP 117/79 | Wt 214.0 lb

## 2020-10-06 DIAGNOSIS — O09529 Supervision of elderly multigravida, unspecified trimester: Secondary | ICD-10-CM

## 2020-10-06 DIAGNOSIS — R110 Nausea: Secondary | ICD-10-CM

## 2020-10-06 DIAGNOSIS — Z3A15 15 weeks gestation of pregnancy: Secondary | ICD-10-CM

## 2020-10-06 DIAGNOSIS — O0990 Supervision of high risk pregnancy, unspecified, unspecified trimester: Secondary | ICD-10-CM

## 2020-10-06 DIAGNOSIS — O10019 Pre-existing essential hypertension complicating pregnancy, unspecified trimester: Secondary | ICD-10-CM

## 2020-10-06 DIAGNOSIS — Z363 Encounter for antenatal screening for malformations: Secondary | ICD-10-CM

## 2020-10-06 DIAGNOSIS — O99210 Obesity complicating pregnancy, unspecified trimester: Secondary | ICD-10-CM

## 2020-10-06 LAB — POCT URINALYSIS DIPSTICK OB
Glucose, UA: NEGATIVE
POC,PROTEIN,UA: NEGATIVE

## 2020-10-06 MED ORDER — PROCHLORPERAZINE MALEATE 10 MG PO TABS: 10.0000 mg | ORAL_TABLET | Freq: Four times a day (QID) | ORAL | 3 refills | Status: DC | PRN

## 2020-10-06 MED ORDER — PROMETHAZINE HCL 25 MG PO TABS: 25.0000 mg | ORAL_TABLET | Freq: Two times a day (BID) | ORAL | 0 refills | Status: DC | PRN

## 2020-10-06 NOTE — Progress Notes (Signed)
Routine Prenatal Care Visit  Subjective  Brenda [name] is a 40 y.o. G3P1011 at [redacted]w[redacted]d being seen today for ongoing prenatal care.  She is currently monitored for the following issues for this high-risk pregnancy and has Adult ADHD; Blood in stool; Rectal polyp; Heartburn; Nausea; Epigastric abdominal pain; Abdominal pain; Gastroesophageal reflux disease; Essential hypertension; Performance anxiety; Supervision of high risk pregnancy, antepartum; Antepartum multigravida of advanced maternal age; Maternal obesity, antepartum; and Pre-existing essential hypertension during pregnancy, antepartum on their problem list.  ----------------------------------------------------------------------------------- Patient reports no complaints.   Contractions: Not present. Vag. Bleeding: None.  Movement: Absent. Denies leaking of fluid.  ----------------------------------------------------------------------------------- The following portions of the patient's history were reviewed and updated as appropriate: allergies, current medications, past family history, past medical history, past social history, past surgical history and problem list. Problem list updated.   Objective  Blood pressure 117/79, weight 214 lb (97.1 kg), last menstrual period 06/18/2020. Pregravid weight 190 lb (86.2 kg) Total Weight Gain 24 lb (10.9 kg)  Body mass index is 36.73 kg/m.  Urinalysis:      Fetal Status: Fetal Heart Rate (bpm): 152   Movement: Absent     General:  Alert, oriented and cooperative. Patient is in no acute distress.  Skin: Skin is warm and dry. No rash noted.   Cardiovascular: Normal heart rate noted  Respiratory: Normal respiratory effort, no problems with respiration noted  Abdomen: Soft, gravid, appropriate for gestational age. Pain/Pressure: Absent     Pelvic:  Cervical exam deferred        Extremities: Normal range of motion.     ental Status: Normal mood and affect. Normal behavior. Normal  judgment and thought content.     Assessment   40 y.o. G3P1011 at [redacted]w[redacted]d by  03/25/2021, by Last Menstrual Period presenting for routine prenatal visit  Plan   pregnancy 3 Problems (from 08/05/20 to present)    Problem Noted Resolved   Supervision of high risk pregnancy, antepartum 08/05/2020 by Vena Austria, MD No   Overview Signed 10/06/2020  9:34 AM by Vena Austria, MD    [facility] Prenatal Labs  Dating LMP = 9 week [place] Blood type: O/Positive/-- (02/10 1202)   Genetic Screen NIPS: Normal XY Antibody:Negative (02/10 1202)  Anatomic [place]  Rubella: 1.64 (02/10 1202) Varicella: Immune  GTT Early:               Third trimester:  RPR: Non Reactive (02/10 1202)   Rhogam  HBsAg: Negative (02/10 1202)   TDaP vaccine                       Flu Shot: HIV: Non Reactive (02/10 1202)   Baby Food                                GBS:   Contraception  Pap: 08/05/2020 ASCUS HPV negative  CBB     CS/VBAC    Support Person Husband Arville Care           Antepartum multigravida of advanced maternal age 68/03/2021 by Vena Austria, MD No   Pre-existing essential hypertension during pregnancy, antepartum 08/05/2020 by Vena Austria, MD No       Gestational age appropriate obstetric precautions including but not limited to vaginal bleeding, contractions, leaking of fluid and fetal movement were reviewed in detail with the patient.    - MFM anatomy scan ordered - Headaches  still some nausea too.  Refill promethazine add prn compazine - Allergies discussed antihistamines safe to use  Return in about 4 weeks (around 11/03/2020) for ROB.  Vena Austria, MD, [facility], [facility] 10/06/2020, 10:06 AM

## 2020-10-12 ENCOUNTER — Other Ambulatory Visit: Payer: Self-pay | Admitting: Obstetrics and Gynecology

## 2020-10-12 DIAGNOSIS — O10019 Pre-existing essential hypertension complicating pregnancy, unspecified trimester: Secondary | ICD-10-CM

## 2020-10-12 DIAGNOSIS — O09529 Supervision of elderly multigravida, unspecified trimester: Secondary | ICD-10-CM

## 2020-10-12 DIAGNOSIS — Z363 Encounter for antenatal screening for malformations: Secondary | ICD-10-CM

## 2020-11-04 ENCOUNTER — Other Ambulatory Visit: Payer: Self-pay

## 2020-11-04 ENCOUNTER — Ambulatory Visit (INDEPENDENT_AMBULATORY_CARE_PROVIDER_SITE_OTHER): Payer: BC Managed Care – PPO | Admitting: Obstetrics and Gynecology

## 2020-11-04 VITALS — BP 122/74 | Wt 225.0 lb

## 2020-11-04 DIAGNOSIS — O99210 Obesity complicating pregnancy, unspecified trimester: Secondary | ICD-10-CM

## 2020-11-04 DIAGNOSIS — O09529 Supervision of elderly multigravida, unspecified trimester: Secondary | ICD-10-CM

## 2020-11-04 DIAGNOSIS — Z3A19 19 weeks gestation of pregnancy: Secondary | ICD-10-CM

## 2020-11-04 DIAGNOSIS — O0990 Supervision of high risk pregnancy, unspecified, unspecified trimester: Secondary | ICD-10-CM

## 2020-11-04 DIAGNOSIS — O10019 Pre-existing essential hypertension complicating pregnancy, unspecified trimester: Secondary | ICD-10-CM

## 2020-11-04 LAB — POCT URINALYSIS DIPSTICK OB
Glucose, UA: NEGATIVE
POC,PROTEIN,UA: NEGATIVE

## 2020-11-04 NOTE — Progress Notes (Signed)
Routine Prenatal Care Visit  Subjective  Brenda [name] is a 40 y.o. G3P1011 at [redacted]w[redacted]d being seen today for ongoing prenatal care.  She is currently monitored for the following issues for this low-risk pregnancy and has Adult ADHD; Blood in stool; Rectal polyp; Heartburn; Nausea; Epigastric abdominal pain; Abdominal pain; Gastroesophageal reflux disease; Essential hypertension; Performance anxiety; Supervision of high risk pregnancy, antepartum; Antepartum multigravida of advanced maternal age; Maternal obesity, antepartum; and Pre-existing essential hypertension during pregnancy, antepartum on their problem list.  ----------------------------------------------------------------------------------- Patient reports no complaints.   Contractions: Not present. Vag. Bleeding: None.  Movement: Absent. Denies leaking of fluid.  ----------------------------------------------------------------------------------- The following portions of the patient's history were reviewed and updated as appropriate: allergies, current medications, past family history, past medical history, past social history, past surgical history and problem list. Problem list updated.   Objective  Blood pressure 122/74, weight 225 lb (102.1 kg), last menstrual period 06/18/2020. Pregravid weight 190 lb (86.2 kg) Total Weight Gain 35 lb (15.9 kg) Urinalysis:      Fetal Status: Fetal Heart Rate (bpm): 155   Movement: Absent     General:  Alert, oriented and cooperative. Patient is in no acute distress.  Skin: Skin is warm and dry. No rash noted.   Cardiovascular: Normal heart rate noted  Respiratory: Normal respiratory effort, no problems with respiration noted  Abdomen: Soft, gravid, appropriate for gestational age. Pain/Pressure: Absent     Pelvic:  Cervical exam deferred        Extremities: Normal range of motion.     ental Status: Normal mood and affect. Normal behavior. Normal judgment and thought content.      Assessment   40 y.o. G3P1011 at [redacted]w[redacted]d by  03/25/2021, by Last Menstrual Period presenting for routine prenatal visit  Plan   pregnancy 3 Problems (from 08/05/20 to present)    Problem Noted Resolved   Supervision of high risk pregnancy, antepartum 08/05/2020 by Vena Austria, MD No   Overview Addendum 10/06/2020  2:45 PM by Vena Austria, MD    [facility] Prenatal Labs  Dating LMP = 9 week [place] Blood type: O/Positive/-- (02/10 1202)   Genetic Screen NIPS: Normal XY Antibody:Negative (02/10 1202)  Anatomic [place]  Rubella: 1.64 (02/10 1202) Varicella: Immune  GTT Early:               Third trimester:  RPR: Non Reactive (02/10 1202)   Rhogam N/A HBsAg: Negative (02/10 1202)   TDaP vaccine                       Flu Shot: HIV: Non Reactive (02/10 1202)   Baby Food                                GBS:   Contraception  Pap: 08/05/2020 ASCUS HPV negative  CBB     CS/VBAC N/A   Support Person Husband Arville Care            Previous Version   Antepartum multigravida of advanced maternal age 47/03/2021 by Vena Austria, MD No   Maternal obesity, antepartum 08/05/2020 by Vena Austria, MD No   Pre-existing essential hypertension during pregnancy, antepartum 08/05/2020 by Vena Austria, MD No       Gestational age appropriate obstetric precautions including but not limited to vaginal bleeding, contractions, leaking of fluid and fetal movement were reviewed in detail with the patient.  Return in about 12 days (around 11/16/2020) for ROB and follow up from anatomy scan (already scheduled).  Vena Austria, MD, [facility], [facility] 11/04/2020, 10:08 AM

## 2020-11-16 ENCOUNTER — Other Ambulatory Visit: Payer: Self-pay

## 2020-11-16 ENCOUNTER — Ambulatory Visit: Payer: BC Managed Care – PPO | Attending: Maternal & Fetal Medicine

## 2020-11-16 ENCOUNTER — Ambulatory Visit (HOSPITAL_BASED_OUTPATIENT_CLINIC_OR_DEPARTMENT_OTHER): Payer: BC Managed Care – PPO | Admitting: Maternal & Fetal Medicine

## 2020-11-16 DIAGNOSIS — O09522 Supervision of elderly multigravida, second trimester: Secondary | ICD-10-CM

## 2020-11-16 DIAGNOSIS — O99342 Other mental disorders complicating pregnancy, second trimester: Secondary | ICD-10-CM | POA: Insufficient documentation

## 2020-11-16 DIAGNOSIS — E669 Obesity, unspecified: Secondary | ICD-10-CM | POA: Diagnosis not present

## 2020-11-16 DIAGNOSIS — O10012 Pre-existing essential hypertension complicating pregnancy, second trimester: Secondary | ICD-10-CM | POA: Diagnosis not present

## 2020-11-16 DIAGNOSIS — O09529 Supervision of elderly multigravida, unspecified trimester: Secondary | ICD-10-CM

## 2020-11-16 DIAGNOSIS — O10912 Unspecified pre-existing hypertension complicating pregnancy, second trimester: Secondary | ICD-10-CM | POA: Diagnosis not present

## 2020-11-16 DIAGNOSIS — F329 Major depressive disorder, single episode, unspecified: Secondary | ICD-10-CM | POA: Insufficient documentation

## 2020-11-16 DIAGNOSIS — O99212 Obesity complicating pregnancy, second trimester: Secondary | ICD-10-CM

## 2020-11-16 DIAGNOSIS — F419 Anxiety disorder, unspecified: Secondary | ICD-10-CM | POA: Insufficient documentation

## 2020-11-16 DIAGNOSIS — F908 Attention-deficit hyperactivity disorder, other type: Secondary | ICD-10-CM | POA: Diagnosis not present

## 2020-11-16 DIAGNOSIS — Z363 Encounter for antenatal screening for malformations: Secondary | ICD-10-CM

## 2020-11-16 DIAGNOSIS — O10019 Pre-existing essential hypertension complicating pregnancy, unspecified trimester: Secondary | ICD-10-CM

## 2020-11-16 DIAGNOSIS — Z3A21 21 weeks gestation of pregnancy: Secondary | ICD-10-CM

## 2020-11-16 DIAGNOSIS — Z79899 Other long term (current) drug therapy: Secondary | ICD-10-CM | POA: Diagnosis not present

## 2020-11-16 IMAGING — US US MFM OB DETAIL+14 WK
1 series · 13 of 28 positions shown · non-contrast
Comparison: none

[Series 1: us mfm ob detail+14 wk · 13 of 75 slices shown]
[im 3/75]
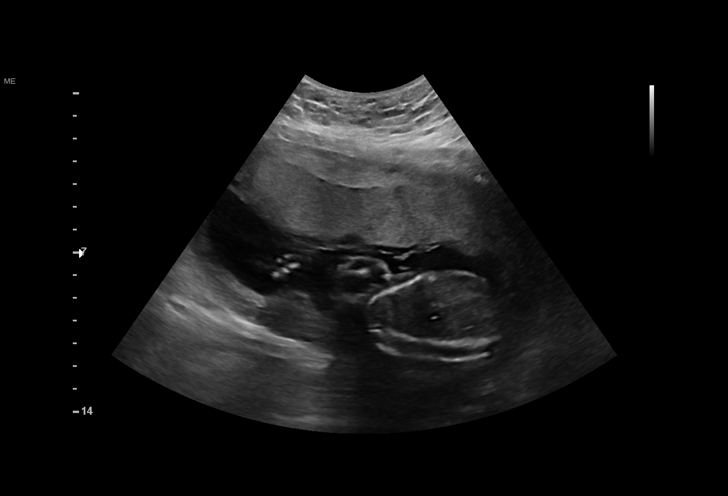
[im 9/75]
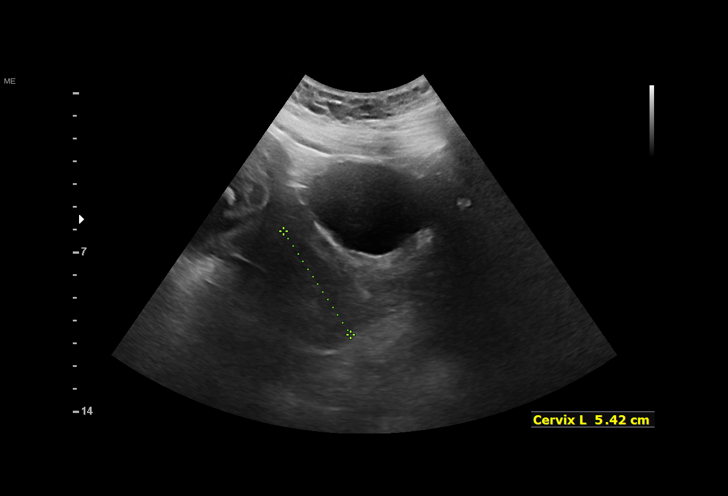
[im 14/75]
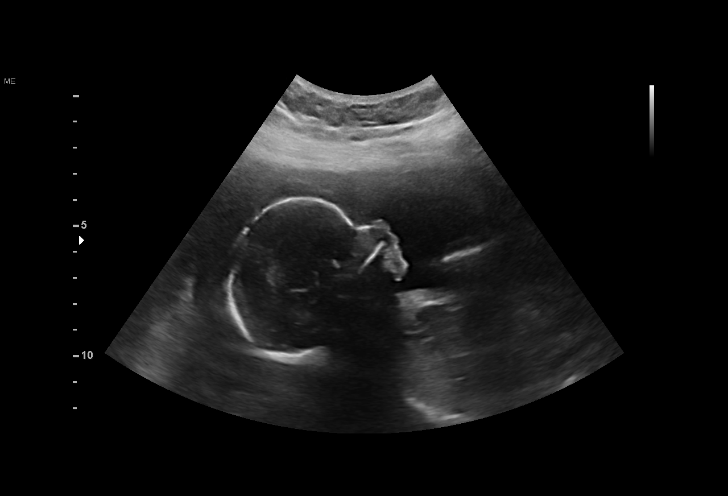
[im 20/75]
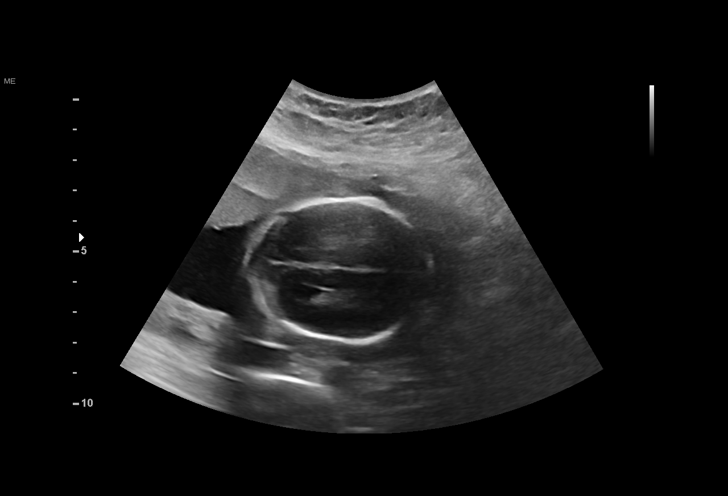
[im 25/75]
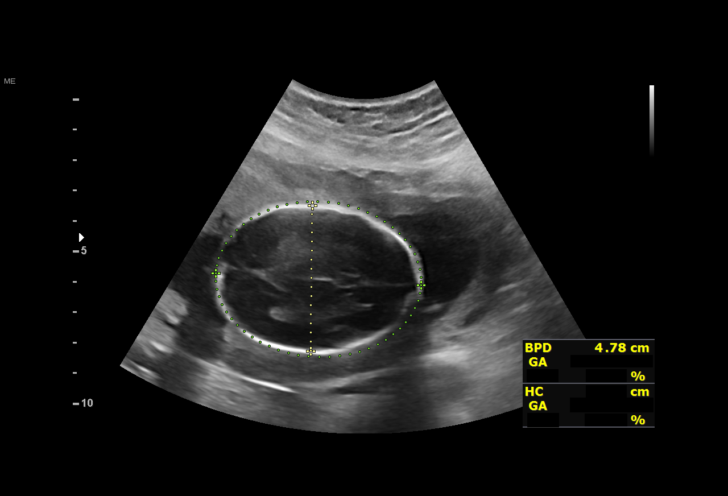
[im 31/75]
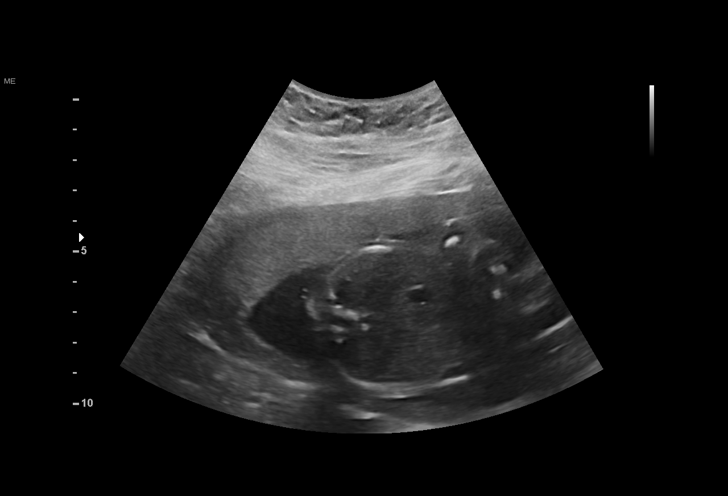
[im 39/75]
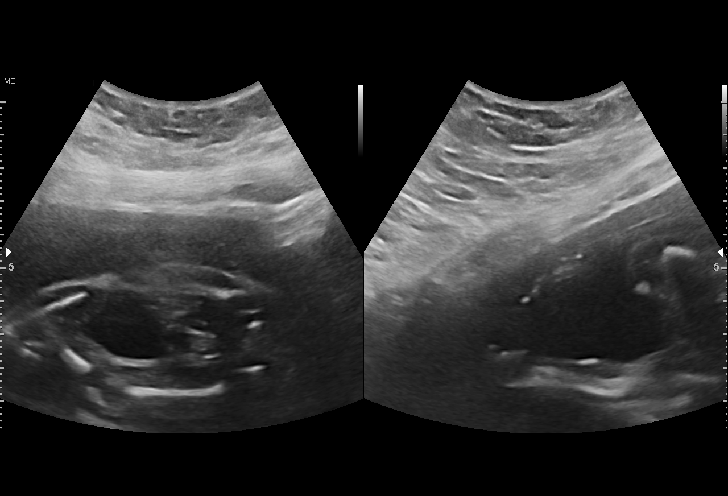
[im 44/75]
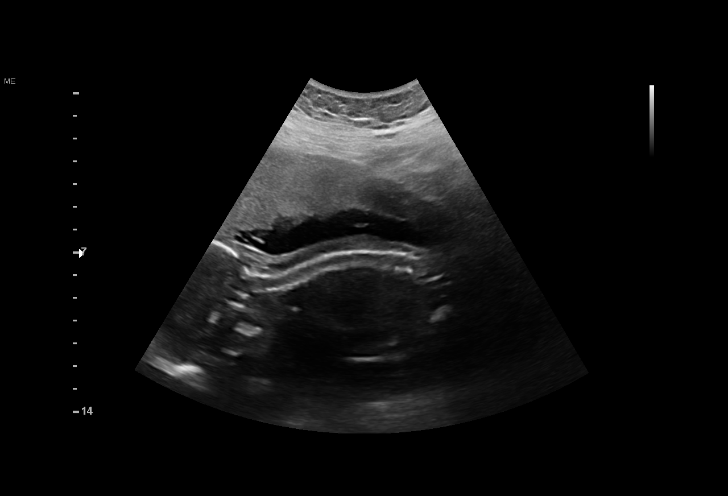
[im 50/75]
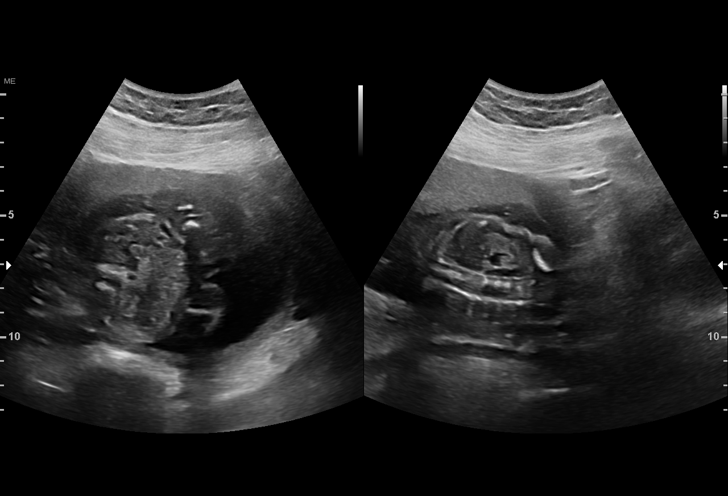
[im 55/75]
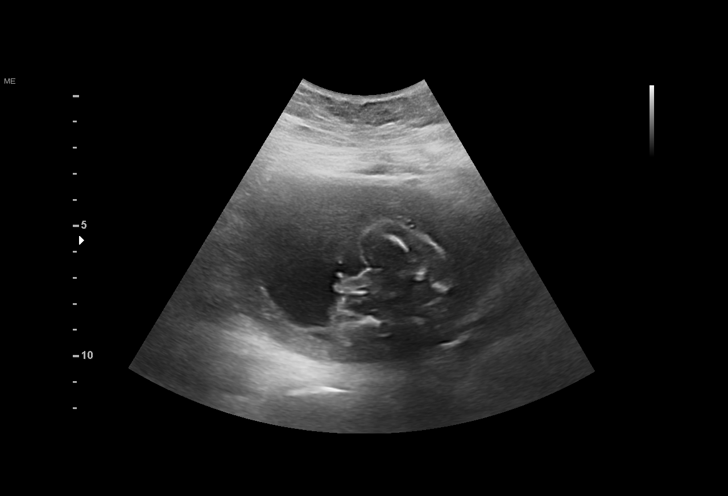
[im 61/75]
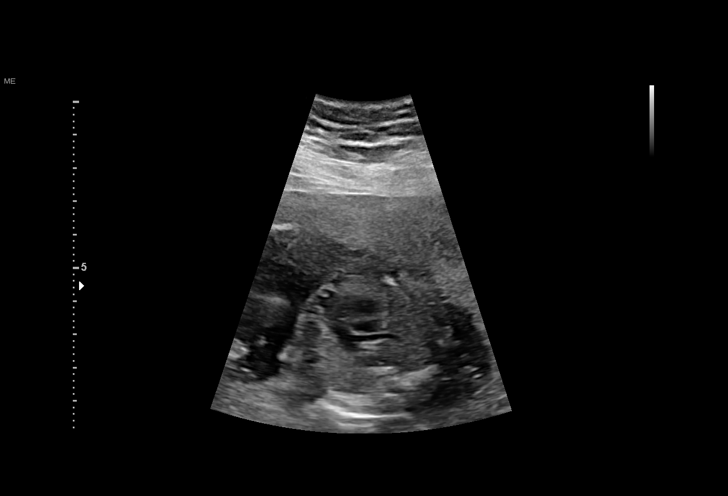
[im 66/75]
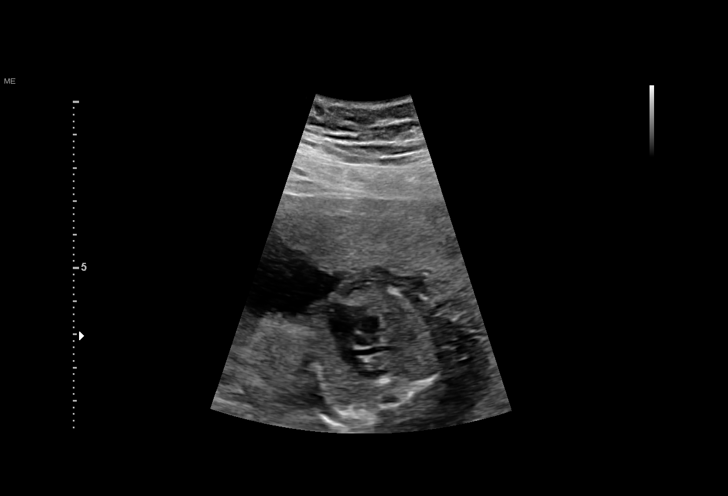
[im 72/75]
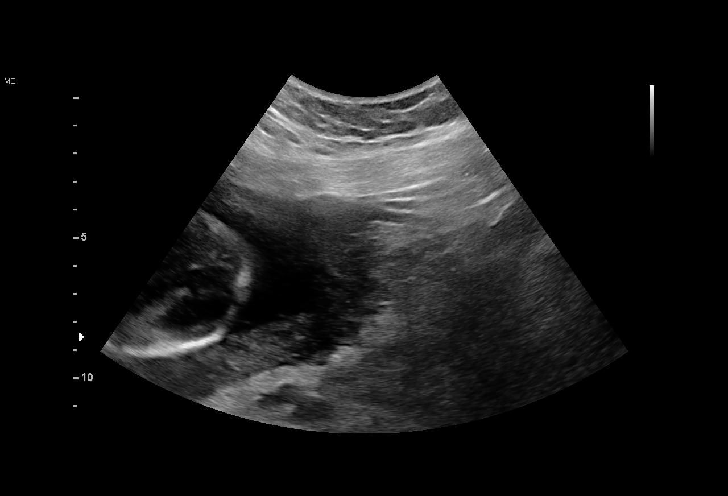

[13 of 28 positions shown; findings below may reference images not displayed]

Indications

 Advanced maternal age multigravida 35+,        [IW]
 second trimester
 Obesity complicating pregnancy, second         [IW]
 trimester
 Pre-existing essential hypertension            [IW]
 complicating pregnancy, second trimester
 21 weeks gestation of pregnancy
Fetal Evaluation

 Num Of Fetuses:         1
 Fetal Heart Rate(bpm):  167
 Cardiac Activity:       Observed
 Presentation:           Breech
 Placenta:               Anterior
 P. Cord Insertion:      Visualized, central

 Amniotic Fluid
 AFI FV:      Within normal limits
Biometry

 BPD:      48.2  mm     G. Age:  20w 4d         13  %    CI:        67.19   %    70 - 86
                                                         FL/HC:      17.9   %    18.4 -
 HC:      188.3  mm     G. Age:  21w 1d         22  %    HC/AC:      1.20        1.06 -
 AC:      156.4  mm     G. Age:  20w 6d         20  %    FL/BPD:     69.9   %    71 - 87
 FL:       33.7  mm     G. Age:  20w 4d         12  %    FL/AC:      21.5   %    20 - 24
 HUM:      31.1  mm     G. Age:  20w 2d         16  %

 Est. FW:     373  gm    0 lb 13 oz      11  %
Gestational Age

 LMP:           21w 4d        Date:  [DATE]                 EDD:   [DATE]
 U/S Today:     20w 6d                                        EDD:   [DATE]
 Best:          21w 4d     Det. By:  LMP  ([DATE])          EDD:   [DATE]
Anatomy

 Cranium:               Appears normal         Aortic Arch:            Appears normal
 Cavum:                 Appears normal         Ductal Arch:            Not well visualized
 Ventricles:            Appears normal         Diaphragm:              Appears normal
 Choroid Plexus:        Appears normal         Stomach:                Appears normal, left
                                                                       sided
 Cerebellum:            Appears normal         Abdomen:                Appears normal
 Posterior Fossa:       Appears normal         Abdominal Wall:         Appears nml (cord
                                                                       insert, abd wall)
 Nuchal Fold:           Appears normal         Cord Vessels:           Appears normal (3
                                                                       vessel cord)
 Face:                  Appears normal         Kidneys:                Appear normal
                        (orbits and profile)
 Lips:                  Appears normal         Bladder:                Appears normal
 Heart:                 Not well visualized    Spine:                  Appears normal
 RVOT:                  Appears normal         Upper Extremities:      Appears normal
 LVOT:                  Appears normal         Lower Extremities:      Appears normal
Cervix Uterus Adnexa

 Cervix
 Length:            4.8  cm.

 Adnexa
 No abnormality visualized.
Comments

 MFM Consultation ( please see detailed consultation in [REDACTED]).

 Single intraurterine pregnancy with measurements consistent
 with dates. EFW 11% with normal AC and amniotic fluid
 volume.
 Suboptimal views of the fetal anatomy was obtained
 secondary to fetal position and maternal habitus.
 There were no markers of aneuploidy observed.

 Impression/Counseling:

 I discussed with Ms. MORELLI the normal nature of today's
 examination. In addition, we discussed her prior Mat 21 result
 of a low risk screening. I discussed the option of diagnostic
 testing and she declined at this time. In addition, I offered
 genetic counseling and she also declined.

 With regards to chronic hypertension she is taking
 hydrochlorothiazide 25 mg daily prn and Labetalol 200 mg
 BID.

 I reviewed the increased risk for preeclampsia, fetal growth
 restriction, placental abruption and stillbirth in women with
 chronic hypertension. We recommend maintaining blood
 pressure < 150/105 mmHg and to increase the therapy to
 stay below this threshold.

 In addition, given the risk to the pregnancy we recommend
 daily low dose ASA for preeclampsia prevention. Fetal growth
 surveillance should occur every 4 to 6 weeks ( etc [DATE]
 week).

 In initiate weekly testing at 32 weeks.

 Delivery is recommended between 37-39 weeks pending
 maternal and fetal status.

 Secondly, Ms. MORELLI BMI is 38 kg/m3 we discussed the
 increased risk for fetal macrosomia, thrombosis in pregnancy,
 cesarean delivery, gestational diabetes and preeclampsia.
 We discussed the goal of 11-20lb weight gain.

 Lastly, we discussed that the fetus is small for gestational
 age at this time but is normal in growth. We will continue
 serial fetal surveillance.

 All questions answered.
 I spent 60 minutes with > 50% in face face consultation.

## 2020-11-16 NOTE — Progress Notes (Signed)
MFM Consultation  Date of Service: 11/16/20 Reason for request: Hypertension in pregnancy Requesting Provider: Dr. Vena Austria.   Ms. Brenda [name] is a 40 yo G3P1 who is here in consultation regarding the above noted indications. She is 21 w 4 d with an EDD fo 03/25/21 dated by LMP that is consistent with a 9w 3d ultrasound. She is seen at the request of Dr. Bonney Aid  Ms. Dimick is doing well without complaints. She denies no s/sx of preeclampsia or preterm labor.  Her pregnancy related issues included:  1) AMA she has a low risk Mat 21 and declined genetic counseling.  2) Elevated BMI of 38 kg/m3  3) Chronic hypertension. She is taking Labetalol 200 mg BID and is no longer taking hydrochlorothiazide. She had an early presentation at 6 weeks with a blood pressure recorded at 156/95. Since then her blood pressure appears normal. Her baseline labs are normal including CBC, CMP and UPC. She reports that she has carried the diagnosis of chronic hypertension prior to the pregnancy. She had a prior pregnancy that went to term and did not have preeclampsia.    Vitals with BMI 11/16/2020 11/04/2020 10/06/2020  Height 5\' 4"  - -  Weight 223 lbs 225 lbs 214 lbs  BMI 38.26 - 36.72  Systolic 114 122  Diastolic 69 74 79  Pulse 65 - -   CBC Latest Ref Rng & Units 08/05/2020 03/09/2018 06/07/2017  WBC 3.4 - 10.8 x10E3/uL 14.4(H) 8.3 15.0(H)  Hemoglobin 11.1 - 15.9 g/dL 06/09/2017 77.9 39.0  Hematocrit 34.0 - 46.6 % 45.0 40.5 45.5  Platelets 150 - 450 x10E3/uL 390 342 382   CMP Latest Ref Rng & Units 08/05/2020 07/10/2019 03/09/2018  Glucose 65 - 99 mg/dL 66 03/11/2018) 923(R)  BUN 6 - 20 mg/dL 12 15 10   Creatinine 0.57 - 1.00 mg/dL 007(M 2.26  Sodium 134 - 144 mmol/L 138 141 138  Potassium 3.5 - 5.2 mmol/L 3.6 4.1 3.5  Chloride 96 - 106 mmol/L 99 99 106  CO2 20 - 29 mmol/L 21 23 24   Calcium 8.7 - 10.2 mg/dL 10.5(H) 10.6(H) 9.1  Total Protein 6.0 - 8.5 g/dL 7.6 - 7.1  Total Bilirubin 0.0 - 1.2 mg/dL  0.3 - 0.3  Alkaline Phos 44 - 121 IU/L 62 - 52  AST 0 - 40 IU/L 14 - 37  ALT 0 - 32 IU/L 17 - 43   OB History  Gravida Para Term Preterm AB Living  3 1 1  0 1 1  SAB IAB Ectopic Multiple Live Births  0 0 0 0 1    # Outcome Date GA Lbr Len/2nd Weight Sex Delivery Anes PTL Lv  3 Current           2 Term 05/13/13   3402 g F Vag-Spont  N LIV  1 AB            Past Medical History:  Diagnosis Date  . ADHD, adult residual type   . Anxiety   . Depression   . GERD (gastroesophageal reflux disease)   . Hypertension   . Wears contact lenses    Past Surgical History:  Procedure Laterality Date  . BREAST REDUCTION SURGERY    . COLONOSCOPY WITH PROPOFOL N/A 11/09/2016   Procedure: COLONOSCOPY WITH PROPOFOL;  Surgeon: , MD;  Location: [facility];  Service: Endoscopy;  Laterality: N/A;  . ESOPHAGOGASTRODUODENOSCOPY (EGD) WITH PROPOFOL N/A 11/09/2016   Procedure: ESOPHAGOGASTRODUODENOSCOPY (EGD) WITH PROPOFOL;  Surgeon: 11/11/2016, MD;  Location: [facility];  Service: Endoscopy;  Laterality: N/A;  . TONSILLECTOMY     Social History   Socioeconomic History  . Marital status: Married    Spouse name: Lewis  . Number of children: Not on file  . Years of education: Not on file  . Highest education level: Not on file  Occupational History  . Occupation: Teacher, adult education   Tobacco Use  . Smoking status: Never Smoker  . Smokeless tobacco: Never Used  Vaping Use  . Vaping Use: Never used  Substance and Sexual Activity  . Alcohol use: Not Currently    Alcohol/week: 10.0 standard drinks    Types: 5 Glasses of wine, 5 Cans of beer per week  . Drug use: No  . Sexual activity: Yes    Birth control/protection: None  Other Topics Concern  . Not on file  Social History Narrative  . Not on file   Social Determinants of Health   Financial Resource Strain: Not on file  Food Insecurity: Not on file  Transportation Needs: Not on file  Physical Activity: Not on file   Stress: Not on file  Social Connections: Not on file  Intimate Partner Violence: Not on file   Family History  Problem Relation Age of Onset  . Heart disease Maternal Grandmother   . Heart disease Maternal Grandfather   . Diabetes Paternal Grandfather   . Hypertension Mother   . Hypertension Father    Allergies  Allergen Reactions  . Ace Inhibitors Swelling    Lips/ face     Current Outpatient Medications (Cardiovascular):  .  labetalol (NORMODYNE) 200 MG tablet, Take 1 tablet (200 mg total) by mouth 2 (two) times daily.  Current Outpatient Medications (Respiratory):  .  promethazine (PHENERGAN) 25 MG tablet, Take 1 tablet (25 mg total) by mouth every 12 (twelve) hours as needed for nausea or vomiting. (Patient not taking: No sig reported)    Current Outpatient Medications (Other):  .  amphetamine-dextroamphetamine (ADDERALL XR) 20 MG 24 hr capsule, Take 20 mg by mouth daily as needed. (Patient not taking: Reported on 11/16/2020) .  Doxylamine-Pyridoxine (DICLEGIS) 10-10 MG TBEC, Take 2 tablets by mouth at bedtime. If symptoms persist, add one tablet in the morning and one in the afternoon (Patient not taking: Reported on 11/16/2020) .  hydrOXYzine (ATARAX/VISTARIL) 25 MG tablet, Take 1 tablet (25 mg total) by mouth every 8 (eight) hours as needed for anxiety. .  pantoprazole (PROTONIX) 40 MG tablet, Take 1 tablet once a day .  prochlorperazine (COMPAZINE) 10 MG tablet, Take 1 tablet (10 mg total) by mouth every 6 (six) hours as needed for nausea or vomiting (headaches). .  traZODone (DESYREL) 100 MG tablet, Take 200 mg by mouth at bedtime.   Imaging:  Single intraurterine pregnancy with measurements consistent with dates. EFW 11% with normal AC and amniotic fluid volume. Suboptimal views of the fetal anatomy was obtained secondary to fetal position and maternal habitus. There were no markers of aneuploidy observed.  Impression/Counseling:   I discussed with Ms. Brenda [name] the  normal nature of today's examination. In addition, we discussed her prior Mat 21 result of a low risk screening. I discussed the option of diagnostic testing and she declined at this time. In addition, I offered genetic counseling and she also declined.  With regards to chronic hypertension she is taking hydrochlorothiazide 25 mg daily prn and Labetalol 200 mg BID.  I reviewed the increased risk for preeclampsia, fetal growth restriction, placental abruption and stillbirth  in women with chronic hypertension. We recommend maintaining blood pressure < 150/105 mmHg and to increase the therapy to stay below this threshold.   In addition, given the risk to the pregnancy we recommend daily low dose ASA for preeclampsia prevention. Fetal growth surveillance should occur every 4 to 6 weeks ( etc 28/32/36 week).  In initiate weekly testing at 32 weeks.  Delivery is recommended between 37-39 weeks pending maternal and fetal status.   Secondly, Ms. Dimick's BMI is 38 kg/m3 we discussed the increased risk for fetal macrosomia, thrombosis in pregnancy, cesarean delivery, gestational diabetes and preeclampsia. We discussed the goal of 11-20lb weight gain.  Lastly, we discussed that the fetus is small for gestational age at this time but is normal in growth. We will continue serial fetal surveillance.   All questions answered.  I spent 60 minutes with > 50% in face face consultation.  Novella Olive, MD

## 2020-11-17 ENCOUNTER — Ambulatory Visit (INDEPENDENT_AMBULATORY_CARE_PROVIDER_SITE_OTHER): Payer: BC Managed Care – PPO | Admitting: Obstetrics and Gynecology

## 2020-11-17 VITALS — BP 128/72 | Wt 223.0 lb

## 2020-11-17 DIAGNOSIS — O99210 Obesity complicating pregnancy, unspecified trimester: Secondary | ICD-10-CM

## 2020-11-17 DIAGNOSIS — Z3A21 21 weeks gestation of pregnancy: Secondary | ICD-10-CM

## 2020-11-17 DIAGNOSIS — O0990 Supervision of high risk pregnancy, unspecified, unspecified trimester: Secondary | ICD-10-CM

## 2020-11-17 DIAGNOSIS — O09529 Supervision of elderly multigravida, unspecified trimester: Secondary | ICD-10-CM

## 2020-11-17 DIAGNOSIS — Z369 Encounter for antenatal screening, unspecified: Secondary | ICD-10-CM

## 2020-11-17 DIAGNOSIS — O10019 Pre-existing essential hypertension complicating pregnancy, unspecified trimester: Secondary | ICD-10-CM

## 2020-11-17 LAB — POCT URINALYSIS DIPSTICK OB: Glucose, UA: NEGATIVE

## 2020-11-17 NOTE — Progress Notes (Signed)
Routine Prenatal Care Visit  Subjective  Brenda [name] is a 40 y.o. G3P1011 at [redacted]w[redacted]d being seen today for ongoing prenatal care.  She is currently monitored for the following issues for this high-risk pregnancy and has Adult ADHD; Blood in stool; Rectal polyp; Heartburn; Nausea; Epigastric abdominal pain; Abdominal pain; Gastroesophageal reflux disease; Essential hypertension; Performance anxiety; Supervision of high risk pregnancy, antepartum; Antepartum multigravida of advanced maternal age; Maternal obesity, antepartum; and Pre-existing essential hypertension during pregnancy, antepartum on their problem list.  ----------------------------------------------------------------------------------- Patient reports no complaints.   Contractions: Not present. Vag. Bleeding: None.  Movement: Present. Denies leaking of fluid.  ----------------------------------------------------------------------------------- The following portions of the patient's history were reviewed and updated as appropriate: allergies, current medications, past family history, past medical history, past social history, past surgical history and problem list. Problem list updated.   Objective  Blood pressure 128/72, weight 223 lb (101.2 kg), last menstrual period 06/18/2020. Pregravid weight 190 lb (86.2 kg) Total Weight Gain 33 lb (15 kg) Urinalysis:      Fetal Status: Fetal Heart Rate (bpm): 155    Movement: Present     General:  Alert, oriented and cooperative. Patient is in no acute distress.  Skin: Skin is warm and dry. No rash noted.   Cardiovascular: Normal heart rate noted  Respiratory: Normal respiratory effort, no problems with respiration noted  Abdomen: Soft, gravid, appropriate for gestational age. Pain/Pressure: Absent     Pelvic:  Cervical exam deferred        Extremities: Normal range of motion.     ental Status: Normal mood and affect. Normal behavior. Normal judgment and thought content.      Assessment   40 y.o. G3P1011 at [redacted]w[redacted]d by  03/25/2021, by Last Menstrual Period presenting for routine prenatal visit  Plan   pregnancy 3 Problems (from 08/05/20 to present)    Problem Noted Resolved   Supervision of high risk pregnancy, antepartum 08/05/2020 by Vena Austria, MD No   Overview Addendum 10/06/2020  2:45 PM by Vena Austria, MD    [facility] Prenatal Labs  Dating LMP = 9 week [place] Blood type: O/Positive/-- (02/10 1202)   Genetic Screen NIPS: Normal XY Antibody:Negative (02/10 1202)  Anatomic [place]  Rubella: 1.64 (02/10 1202) Varicella: Immune  GTT Early:               Third trimester:  RPR: Non Reactive (02/10 1202)   Rhogam N/A HBsAg: Negative (02/10 1202)   TDaP vaccine                       Flu Shot: HIV: Non Reactive (02/10 1202)   Baby Food                                GBS:   Contraception  Pap: 08/05/2020 ASCUS HPV negative  CBB     CS/VBAC N/A   Support Person Husband Arville Care            Previous Version   Antepartum multigravida of advanced maternal age 64/03/2021 by Vena Austria, MD No   Maternal obesity, antepartum 08/05/2020 by Vena Austria, MD No   Pre-existing essential hypertension during pregnancy, antepartum 08/05/2020 by Vena Austria, MD No       Gestational age appropriate obstetric precautions including but not limited to vaginal bleeding, contractions, leaking of fluid and fetal movement were reviewed in detail with the patient.  1) CHTN/Obesity -  ordered 28 week growth (MFM) - BP remains well controlled currently not on medications (was previously on labetalol)  Return in about 4 weeks (around 12/15/2020) for ROB 4 week, 28 week labs and ROB 8 weeks.  Vena Austria, MD, [facility], [facility] 11/17/2020, 10:26 AM

## 2020-11-30 ENCOUNTER — Other Ambulatory Visit: Payer: Self-pay | Admitting: Obstetrics and Gynecology

## 2020-12-08 ENCOUNTER — Telehealth: Payer: Self-pay | Admitting: Obstetrics and Gynecology

## 2020-12-08 NOTE — Telephone Encounter (Signed)
Called and message with Pt about rescheduling her 7/20 appt. with AMS.

## 2020-12-09 ENCOUNTER — Telehealth: Payer: Self-pay | Admitting: Obstetrics and Gynecology

## 2020-12-09 NOTE — Telephone Encounter (Signed)
Called pt to reschedule 7/20 appt (ROB and glucoes) with Dr. Bonney Aid.

## 2020-12-15 ENCOUNTER — Other Ambulatory Visit: Payer: Self-pay | Admitting: Obstetrics and Gynecology

## 2020-12-15 ENCOUNTER — Ambulatory Visit (INDEPENDENT_AMBULATORY_CARE_PROVIDER_SITE_OTHER): Payer: BC Managed Care – PPO | Admitting: Obstetrics and Gynecology

## 2020-12-15 ENCOUNTER — Other Ambulatory Visit: Payer: Self-pay

## 2020-12-15 VITALS — BP 120/84 | Wt 229.0 lb

## 2020-12-15 DIAGNOSIS — O99212 Obesity complicating pregnancy, second trimester: Secondary | ICD-10-CM

## 2020-12-15 DIAGNOSIS — O99340 Other mental disorders complicating pregnancy, unspecified trimester: Secondary | ICD-10-CM

## 2020-12-15 DIAGNOSIS — O0991 Supervision of high risk pregnancy, unspecified, first trimester: Secondary | ICD-10-CM

## 2020-12-15 DIAGNOSIS — O99210 Obesity complicating pregnancy, unspecified trimester: Secondary | ICD-10-CM

## 2020-12-15 DIAGNOSIS — O0992 Supervision of high risk pregnancy, unspecified, second trimester: Secondary | ICD-10-CM

## 2020-12-15 DIAGNOSIS — O09529 Supervision of elderly multigravida, unspecified trimester: Secondary | ICD-10-CM

## 2020-12-15 DIAGNOSIS — O0990 Supervision of high risk pregnancy, unspecified, unspecified trimester: Secondary | ICD-10-CM

## 2020-12-15 DIAGNOSIS — O10019 Pre-existing essential hypertension complicating pregnancy, unspecified trimester: Secondary | ICD-10-CM

## 2020-12-15 DIAGNOSIS — R110 Nausea: Secondary | ICD-10-CM

## 2020-12-15 DIAGNOSIS — F419 Anxiety disorder, unspecified: Secondary | ICD-10-CM

## 2020-12-15 DIAGNOSIS — O09522 Supervision of elderly multigravida, second trimester: Secondary | ICD-10-CM

## 2020-12-15 DIAGNOSIS — O10012 Pre-existing essential hypertension complicating pregnancy, second trimester: Secondary | ICD-10-CM

## 2020-12-15 LAB — POCT URINALYSIS DIPSTICK OB
Glucose, UA: NEGATIVE
POC,PROTEIN,UA: NEGATIVE

## 2020-12-15 MED ORDER — PROMETHAZINE HCL 25 MG PO TABS: 25.0000 mg | ORAL_TABLET | Freq: Two times a day (BID) | ORAL | 2 refills | Status: DC | PRN

## 2020-12-15 MED ORDER — HYDROXYZINE HCL 25 MG PO TABS: 25.0000 mg | ORAL_TABLET | Freq: Three times a day (TID) | ORAL | 2 refills | Status: DC | PRN

## 2020-12-15 NOTE — Progress Notes (Signed)
ROB - nausea is back, requesting refills on Atarax/Vistril and Phenergan. RM 2

## 2020-12-15 NOTE — Progress Notes (Signed)
Routine Prenatal Care Visit  Subjective  Brenda [name] is a 40 y.o. G3P1011 at [redacted]w[redacted]d being seen today for ongoing prenatal care.  She is currently monitored for the following issues for this high-risk pregnancy and has Adult ADHD; Blood in stool; Rectal polyp; Heartburn; Nausea; Epigastric abdominal pain; Abdominal pain; Gastroesophageal reflux disease; Essential hypertension; Performance anxiety; Supervision of high risk pregnancy, antepartum; Antepartum multigravida of advanced maternal age; Maternal obesity, antepartum; and Pre-existing essential hypertension during pregnancy, antepartum on their problem list.  ----------------------------------------------------------------------------------- Patient reports nausea.   Contractions: Not present. Vag. Bleeding: None.  Movement: Present. Denies leaking of fluid.  ----------------------------------------------------------------------------------- The following portions of the patient's history were reviewed and updated as appropriate: allergies, current medications, past family history, past medical history, past social history, past surgical history and problem list. Problem list updated.   Objective  Blood pressure 120/84, weight 229 lb (103.9 kg), last menstrual period 06/18/2020. Pregravid weight 190 lb (86.2 kg) Total Weight Gain 39 lb (17.7 kg) Urinalysis:      Fetal Status: Fetal Heart Rate (bpm): 145 Fundal Height: 25 cm Movement: Present     General:  Alert, oriented and cooperative. Patient is in no acute distress.  Skin: Skin is warm and dry. No rash noted.   Cardiovascular: Normal heart rate noted  Respiratory: Normal respiratory effort, no problems with respiration noted  Abdomen: Soft, gravid, appropriate for gestational age. Pain/Pressure: Absent     Pelvic:  Cervical exam deferred        Extremities: Normal range of motion.     ental Status: Normal mood and affect. Normal behavior. Normal judgment and thought  content.     Assessment   40 y.o. G3P1011 at [redacted]w[redacted]d by  03/25/2021, by Last Menstrual Period presenting for routine prenatal visit  Plan   pregnancy 3 Problems (from 08/05/20 to present)     Problem Noted Resolved   Supervision of high risk pregnancy, antepartum 08/05/2020 by Vena Austria, MD No   Overview Addendum 11/17/2020  1:18 PM by Vena Austria, MD    [facility] Prenatal Labs  Dating LMP = 9 week [place] Blood type: O/Positive/-- (02/10 1202)   Genetic Screen NIPS: Normal XY Antibody:Negative (02/10 1202)  Anatomic [place] Normal 5/24 MFM Rubella: 1.64 (02/10 1202) Varicella: Immune  GTT Early:               Third trimester:  RPR: Non Reactive (02/10 1202)   Rhogam N/A HBsAg: Negative (02/10 1202)   TDaP vaccine                       Flu Shot: HIV: Non Reactive (02/10 1202)   Baby Food                                GBS:   Contraception  Pap: 08/05/2020 ASCUS HPV negative  CBB     CS/VBAC N/A   Support Person Husband Quint             Antepartum multigravida of advanced maternal age 33/03/2021 by Vena Austria, MD No   Maternal obesity, antepartum 08/05/2020 by Vena Austria, MD No   Pre-existing essential hypertension during pregnancy, antepartum 08/05/2020 by Vena Austria, MD No        Gestational age appropriate obstetric precautions including but not limited to vaginal bleeding, contractions, leaking of fluid and fetal movement were reviewed in detail with the patient.  1) Nausea - refill phenergan - discussed holding labetalol as she has been normotensive or to see if any hypotension associated with nausea  2) HTN - remains well controlled - growth scan with MFM monthly  3) Routine prenatal care - 28 week labs next visit  Return in about 3 weeks (around 01/05/2021) for ROb and 28 week labs.  Vena Austria, MD, [facility], [facility] 12/15/2020, 10:16 AM

## 2020-12-23 ENCOUNTER — Ambulatory Visit: Payer: BC Managed Care – PPO | Attending: Obstetrics

## 2020-12-23 ENCOUNTER — Other Ambulatory Visit: Payer: Self-pay

## 2020-12-23 DIAGNOSIS — O09522 Supervision of elderly multigravida, second trimester: Secondary | ICD-10-CM | POA: Diagnosis not present

## 2020-12-23 DIAGNOSIS — E669 Obesity, unspecified: Secondary | ICD-10-CM | POA: Diagnosis not present

## 2020-12-23 DIAGNOSIS — O10012 Pre-existing essential hypertension complicating pregnancy, second trimester: Secondary | ICD-10-CM | POA: Insufficient documentation

## 2020-12-23 DIAGNOSIS — O99212 Obesity complicating pregnancy, second trimester: Secondary | ICD-10-CM | POA: Insufficient documentation

## 2020-12-23 DIAGNOSIS — Z3A26 26 weeks gestation of pregnancy: Secondary | ICD-10-CM

## 2020-12-23 IMAGING — US US MFM OB FOLLOW-UP
1 series · 14 of 28 positions shown · non-contrast
Comparison: none

[Series 1: us mfm ob follow-up · 14 of 55 slices shown]
[im 3/55]
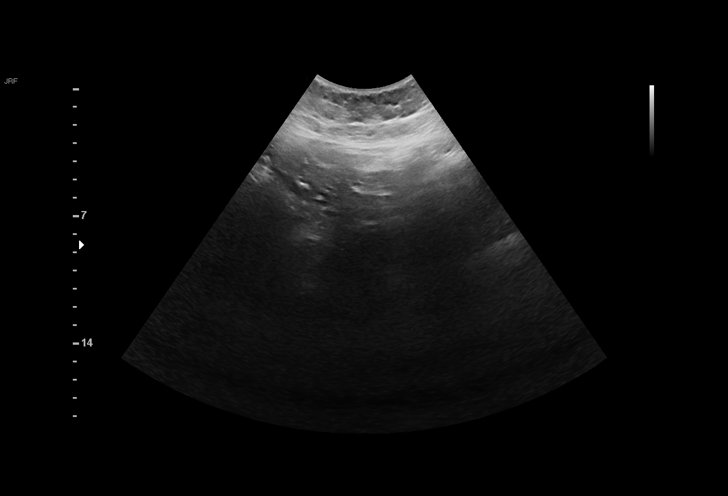
[im 7/55]
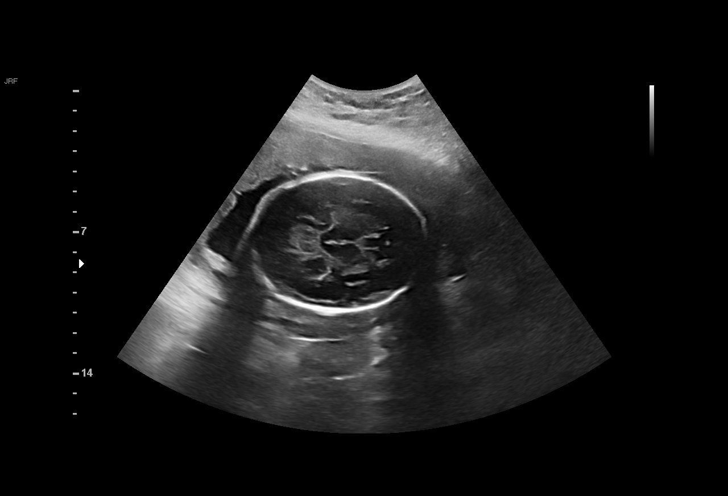
[im 11/55]
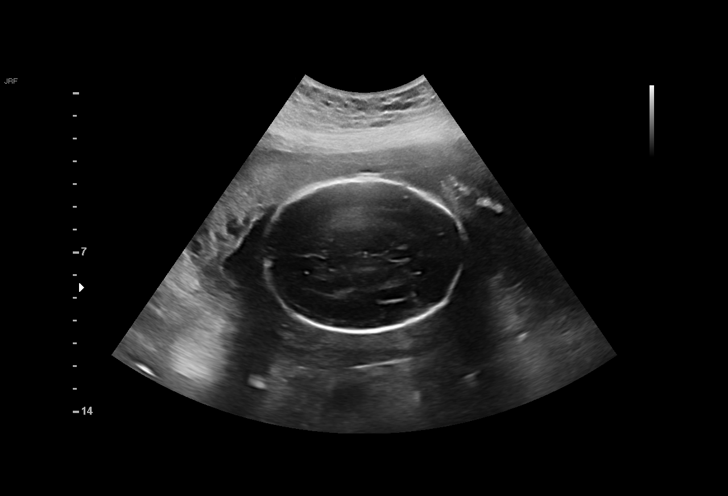
[im 15/55]
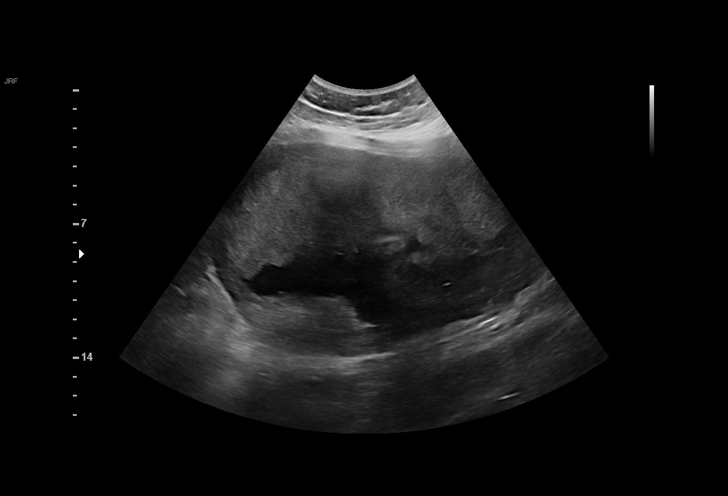
[im 19/55]
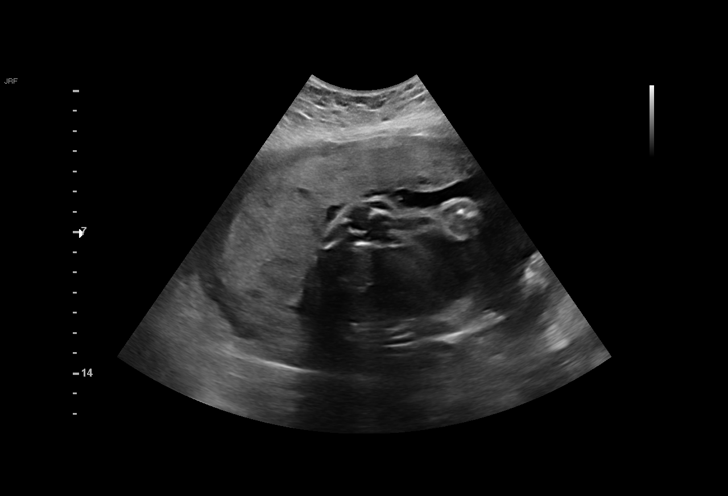
[im 23/55]
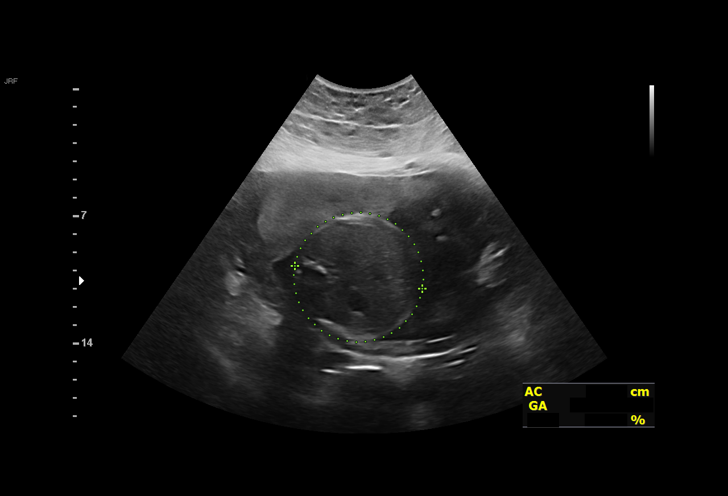
[im 27/55]
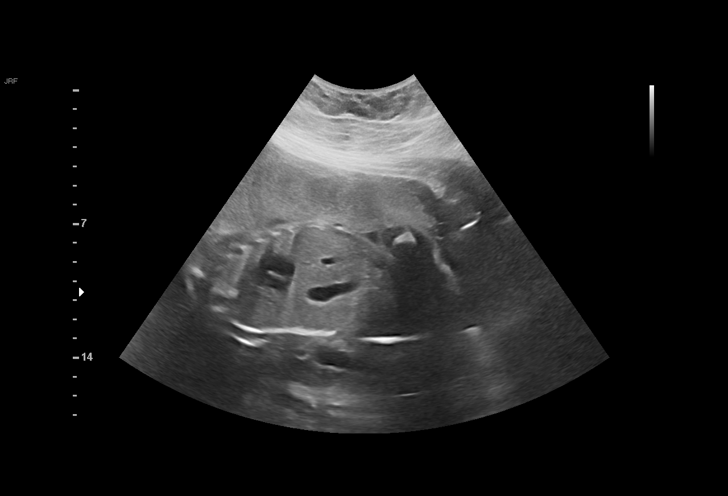
[im 31/55]
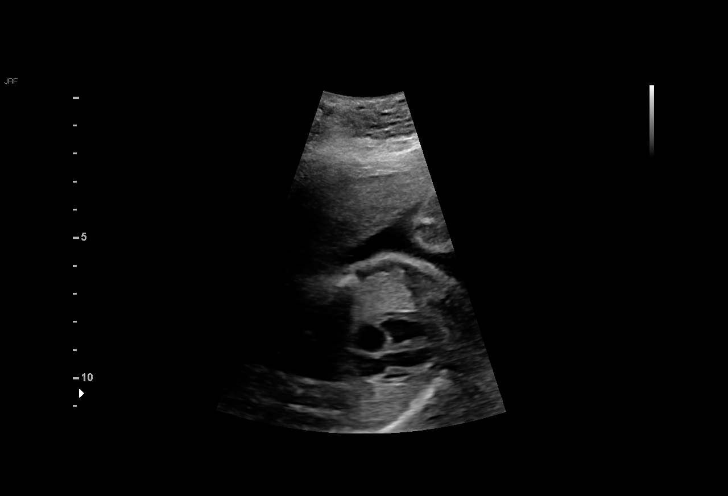
[im 35/55]
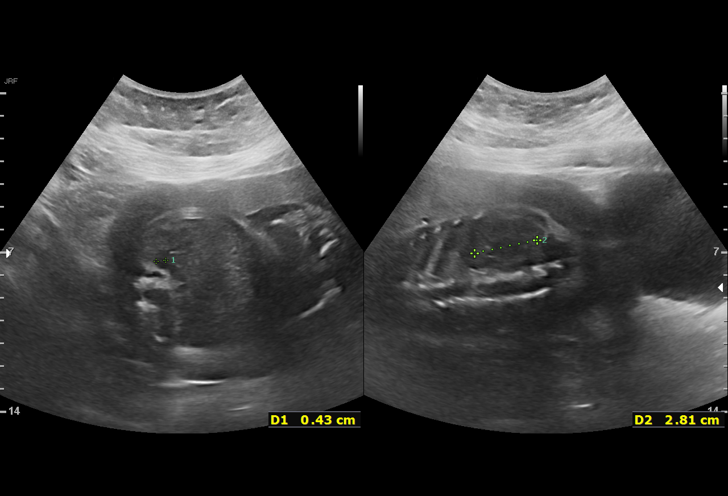
[im 39/55]
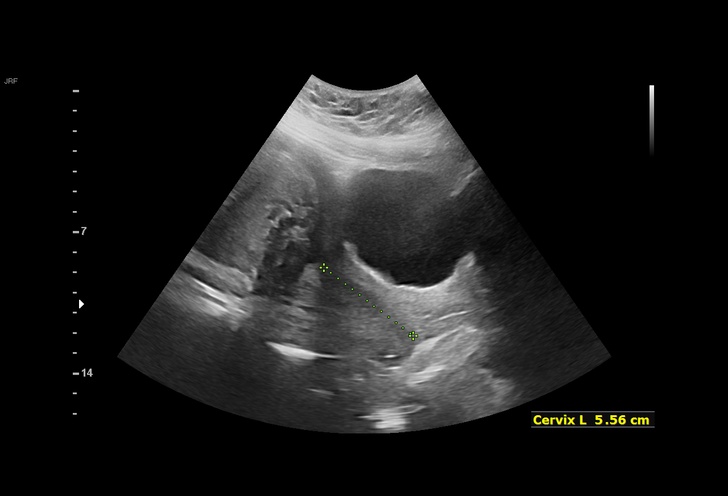
[im 43/55]
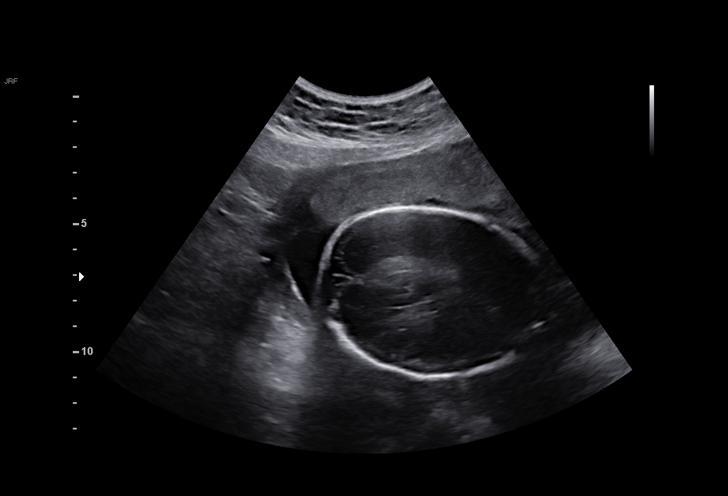
[im 47/55]
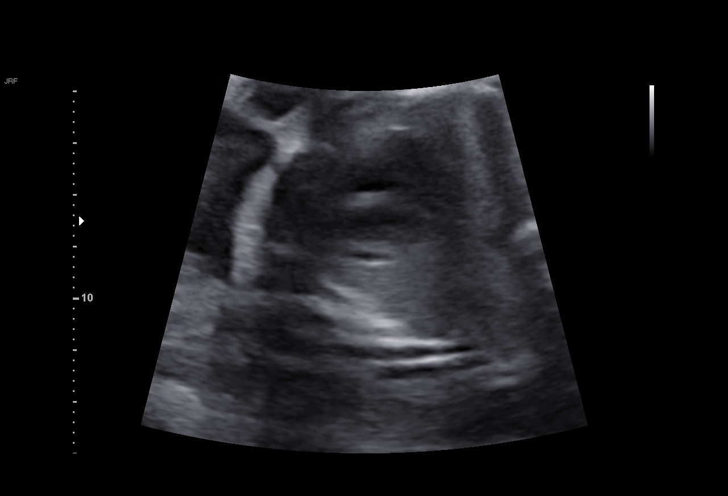
[im 51/55]
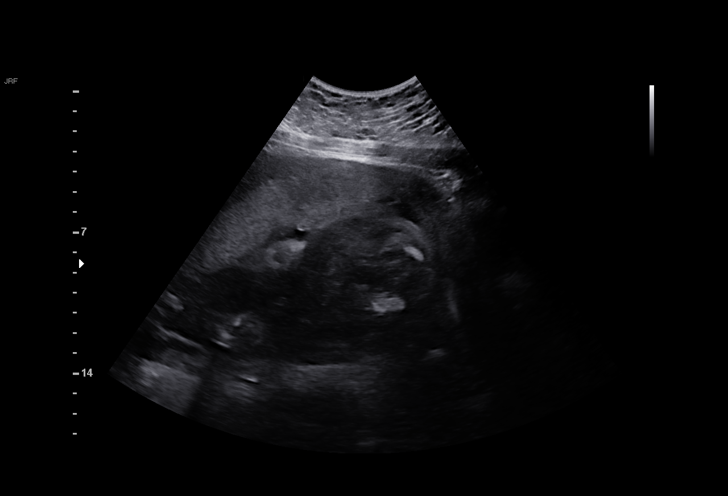
[im 55/55]
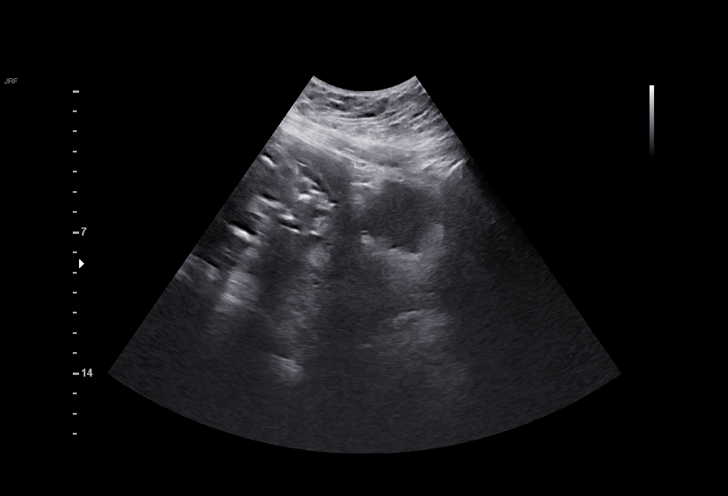

[14 of 28 positions shown; findings below may reference images not displayed]

Indications

 Pre-existing essential hypertension            [ZI]
 complicating pregnancy, second trimester
 Advanced maternal age multigravida 35+,        [ZI]
 second trimester
 Obesity complicating pregnancy, second         [ZI]
 trimester
 26 weeks gestation of pregnancy
Fetal Evaluation

 Num Of Fetuses:         1
 Fetal Heart Rate(bpm):  140
 Cardiac Activity:       Observed
 Presentation:           Variable
 Placenta:               Anterior

                             Largest Pocket(cm)

Biometry

 BPD:      66.3  mm     G. Age:  26w 5d         34  %    CI:        70.19   %    70 - 86
                                                         FL/HC:      18.3   %    18.6 -
 HC:      252.4  mm     G. Age:  27w 3d         40  %    HC/AC:      1.12        1.05 -
 AC:      224.9  mm     G. Age:  26w 6d         43  %    FL/BPD:     69.7   %    71 - 87
 FL:       46.2  mm     G. Age:  25w 2d          5  %    FL/AC:      20.5   %    20 - 24
 HUM:      43.6  mm     G. Age:  26w 0d         26  %

 LV:        4.8  mm
 Est. FW:     930  gm      2 lb 1 oz     21  %
Gestational Age

 LMP:           26w 6d        Date:  [DATE]                 EDD:   [DATE]
 U/S Today:     26w 4d                                        EDD:   [DATE]
 Best:          26w 6d     Det. By:  LMP  ([DATE])          EDD:   [DATE]
Anatomy

 Cranium:               Appears normal         Aortic Arch:            Previously seen
 Cavum:                 Appears normal         Ductal Arch:            Not well visualized
 Ventricles:            Appears normal         Diaphragm:              Appears normal
 Choroid Plexus:        Previously seen        Stomach:                Appears normal, left
                                                                       sided
 Cerebellum:            Previously seen        Abdomen:                Appears normal
 Posterior Fossa:       Previously seen        Abdominal Wall:         Previously seen
 Nuchal Fold:           Not applicable (>20    Cord Vessels:           Previously seen
                        wks GA)
 Face:                  Orbits and profile     Kidneys:                Appear normal
                        previously seen
 Lips:                  Previously seen        Bladder:                Appears normal
 Thoracic:              Appears normal         Spine:                  Previously seen
 Heart:                 Appears normal         Upper Extremities:      Previously seen
                        (4CH, axis, and
                        situs)
 RVOT:                  Previously seen        Lower Extremities:      Previously seen
 LVOT:                  Previously seen
Cervix Uterus Adnexa

 Cervix
 Length:            5.1  cm.
Comments

 This patient was seen for a follow up growth scan due to
 advanced maternal age, maternal obesity, and chronic
 hypertension currently treated with labetalol 200 mg twice a
 day.  She denies any problems since her last exam.
 She was informed that the fetal growth and amniotic fluid
 level appears appropriate for her gestational age.
 The fetal cardiac views remain limited today due to maternal
 body habitus and the fetal position.  She understands that it
 may be possible that we may not be able to clear all of the
 fetal cardiac views during her prenatal ultrasounds.
 Due to chronic hypertension treated with labetalol, weekly
 fetal testing should be started at around 32 weeks.
 A follow up exam was scheduled in 4 weeks.

## 2021-01-12 ENCOUNTER — Ambulatory Visit (INDEPENDENT_AMBULATORY_CARE_PROVIDER_SITE_OTHER): Payer: BC Managed Care – PPO | Admitting: Obstetrics and Gynecology

## 2021-01-12 ENCOUNTER — Other Ambulatory Visit: Payer: BC Managed Care – PPO

## 2021-01-12 ENCOUNTER — Other Ambulatory Visit: Payer: Self-pay

## 2021-01-12 ENCOUNTER — Encounter: Payer: BC Managed Care – PPO | Admitting: Obstetrics and Gynecology

## 2021-01-12 VITALS — BP 124/76

## 2021-01-12 DIAGNOSIS — O99210 Obesity complicating pregnancy, unspecified trimester: Secondary | ICD-10-CM

## 2021-01-12 DIAGNOSIS — O10019 Pre-existing essential hypertension complicating pregnancy, unspecified trimester: Secondary | ICD-10-CM

## 2021-01-12 DIAGNOSIS — O09529 Supervision of elderly multigravida, unspecified trimester: Secondary | ICD-10-CM

## 2021-01-12 DIAGNOSIS — Z3A29 29 weeks gestation of pregnancy: Secondary | ICD-10-CM

## 2021-01-12 DIAGNOSIS — Z369 Encounter for antenatal screening, unspecified: Secondary | ICD-10-CM

## 2021-01-12 DIAGNOSIS — O0990 Supervision of high risk pregnancy, unspecified, unspecified trimester: Secondary | ICD-10-CM

## 2021-01-12 LAB — POCT URINALYSIS DIPSTICK OB: Glucose, UA: NEGATIVE

## 2021-01-12 NOTE — Progress Notes (Signed)
28 wk labs today. Patient has had breast reductions surgery and will not be able to breast feed.

## 2021-01-12 NOTE — Progress Notes (Signed)
Routine Prenatal Care Visit  Subjective  Brenda [name] is a 40 y.o. G3P1011 at [redacted]w[redacted]d being seen today for ongoing prenatal care.  She is currently monitored for the following issues for this low-risk pregnancy and has Adult ADHD; Blood in stool; Rectal polyp; Heartburn; Nausea; Epigastric abdominal pain; Abdominal pain; Gastroesophageal reflux disease; Essential hypertension; Performance anxiety; Supervision of high risk pregnancy, antepartum; Antepartum multigravida of advanced maternal age; Maternal obesity, antepartum; and Pre-existing essential hypertension during pregnancy, antepartum on their problem list.  ----------------------------------------------------------------------------------- Patient reports no complaints.   Contractions: Irritability. Vag. Bleeding: None.  Movement: Present. Denies leaking of fluid.  ----------------------------------------------------------------------------------- The following portions of the patient's history were reviewed and updated as appropriate: allergies, current medications, past family history, past medical history, past social history, past surgical history and problem list. Problem list updated.   Objective  Blood pressure 124/76, last menstrual period 06/18/2020. Pregravid weight 190 lb (86.2 kg) Total Weight Gain 44 lb (20 kg) Urinalysis:      Fetal Status: Fetal Heart Rate (bpm): 145 Fundal Height: 30 cm Movement: Present     General:  Alert, oriented and cooperative. Patient is in no acute distress.  Skin: Skin is warm and dry. No rash noted.   Cardiovascular: Normal heart rate noted  Respiratory: Normal respiratory effort, no problems with respiration noted  Abdomen: Soft, gravid, appropriate for gestational age. Pain/Pressure: Absent     Pelvic:  Cervical exam deferred        Extremities: Normal range of motion.     ental Status: Normal mood and affect. Normal behavior. Normal judgment and thought content.      Assessment   40 y.o. G3P1011 at [redacted]w[redacted]d by  03/25/2021, by Last Menstrual Period presenting for routine prenatal visit  Plan   pregnancy 3 Problems (from 08/05/20 to present)     Problem Noted Resolved   Supervision of high risk pregnancy, antepartum 08/05/2020 by Vena Austria, MD No   Overview Addendum 11/17/2020  1:18 PM by Vena Austria, MD    [facility] Prenatal Labs  Dating LMP = 9 week [place] Blood type: O/Positive/-- (02/10 1202)   Genetic Screen NIPS: Normal XY Antibody:Negative (02/10 1202)  Anatomic [place] Normal 5/24 MFM Rubella: 1.64 (02/10 1202) Varicella: Immune  GTT Early:               Third trimester:  RPR: Non Reactive (02/10 1202)   Rhogam N/A HBsAg: Negative (02/10 1202)   TDaP vaccine                       Flu Shot: HIV: Non Reactive (02/10 1202)   Baby Food                                GBS:   Contraception  Pap: 08/05/2020 ASCUS HPV negative  CBB     CS/VBAC N/A   Support Person Husband Quint            Antepartum multigravida of advanced maternal age 02/02/2021 by Vena Austria, MD No   Maternal obesity, antepartum 08/05/2020 by Vena Austria, MD No   Pre-existing essential hypertension during pregnancy, antepartum 08/05/2020 by Vena Austria, MD No        Gestational age appropriate obstetric precautions including but not limited to vaginal bleeding, contractions, leaking of fluid and fetal movement were reviewed in detail with the patient.    1) CHTN  -  BP remains normotensive - Continue to monitor for need to restart labetalol - growth scan  12/23/2020 2lbs 1oz c/w 21%ile  2) 28 week labs obtained today  Return in about 2 weeks (around 01/26/2021) for ROB.  Vena Austria, MD, [facility], [facility] 01/12/2021, 3:01 PM

## 2021-01-13 LAB — 28 WEEK RH+PANEL
Basophils Absolute: 0 10*3/uL (ref 0.0–0.2)
Basos: 0 %
EOS (ABSOLUTE): 0.4 10*3/uL (ref 0.0–0.4)
Eos: 3 %
Gestational Diabetes Screen: 105 mg/dL (ref 65–139)
HIV Screen 4th Generation wRfx: NONREACTIVE
Hematocrit: 34.6 % (ref 34.0–46.6)
Hemoglobin: 12 g/dL (ref 11.1–15.9)
Immature Grans (Abs): 0.1 10*3/uL (ref 0.0–0.1)
Immature Granulocytes: 1 %
Lymphocytes Absolute: 2.1 10*3/uL (ref 0.7–3.1)
Lymphs: 15 %
MCH: 29.8 pg (ref 26.6–33.0)
MCHC: 34.7 g/dL (ref 31.5–35.7)
MCV: 86 fL (ref 79–97)
Monocytes Absolute: 0.7 10*3/uL (ref 0.1–0.9)
Monocytes: 5 %
Neutrophils Absolute: 10.7 10*3/uL — ABNORMAL HIGH (ref 1.4–7.0)
Neutrophils: 76 %
Platelets: 305 10*3/uL (ref 150–450)
RBC: 4.03 x10E6/uL (ref 3.77–5.28)
RDW: 11.8 % (ref 11.7–15.4)
RPR Ser Ql: NONREACTIVE
WBC: 14 10*3/uL — ABNORMAL HIGH (ref 3.4–10.8)

## 2021-01-18 ENCOUNTER — Other Ambulatory Visit: Payer: Self-pay | Admitting: Maternal & Fetal Medicine

## 2021-01-18 DIAGNOSIS — O09523 Supervision of elderly multigravida, third trimester: Secondary | ICD-10-CM

## 2021-01-18 DIAGNOSIS — O99213 Obesity complicating pregnancy, third trimester: Secondary | ICD-10-CM

## 2021-01-18 DIAGNOSIS — O10919 Unspecified pre-existing hypertension complicating pregnancy, unspecified trimester: Secondary | ICD-10-CM

## 2021-01-20 ENCOUNTER — Other Ambulatory Visit: Payer: Self-pay

## 2021-01-20 ENCOUNTER — Ambulatory Visit: Payer: BC Managed Care – PPO | Attending: Obstetrics and Gynecology

## 2021-01-20 VITALS — BP 117/69 | HR 84 | Temp 98.2°F | Ht 64.0 in | Wt 236.0 lb

## 2021-01-20 DIAGNOSIS — O09523 Supervision of elderly multigravida, third trimester: Secondary | ICD-10-CM | POA: Diagnosis not present

## 2021-01-20 DIAGNOSIS — Z3A30 30 weeks gestation of pregnancy: Secondary | ICD-10-CM | POA: Insufficient documentation

## 2021-01-20 DIAGNOSIS — O10913 Unspecified pre-existing hypertension complicating pregnancy, third trimester: Secondary | ICD-10-CM | POA: Diagnosis not present

## 2021-01-20 DIAGNOSIS — O99213 Obesity complicating pregnancy, third trimester: Secondary | ICD-10-CM

## 2021-01-20 DIAGNOSIS — O0993 Supervision of high risk pregnancy, unspecified, third trimester: Secondary | ICD-10-CM

## 2021-01-20 DIAGNOSIS — O10013 Pre-existing essential hypertension complicating pregnancy, third trimester: Secondary | ICD-10-CM | POA: Diagnosis not present

## 2021-01-20 DIAGNOSIS — O10919 Unspecified pre-existing hypertension complicating pregnancy, unspecified trimester: Secondary | ICD-10-CM

## 2021-01-20 IMAGING — US US MFM OB FOLLOW-UP
1 series · 14 of 28 positions shown · non-contrast
Comparison: none

[Series 1: us mfm ob follow-up · 14 of 34 slices shown]
[im 2/34]
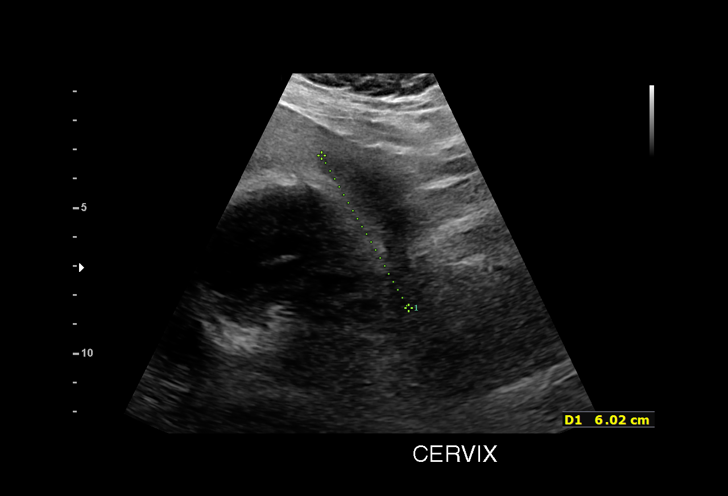
[im 4/34]
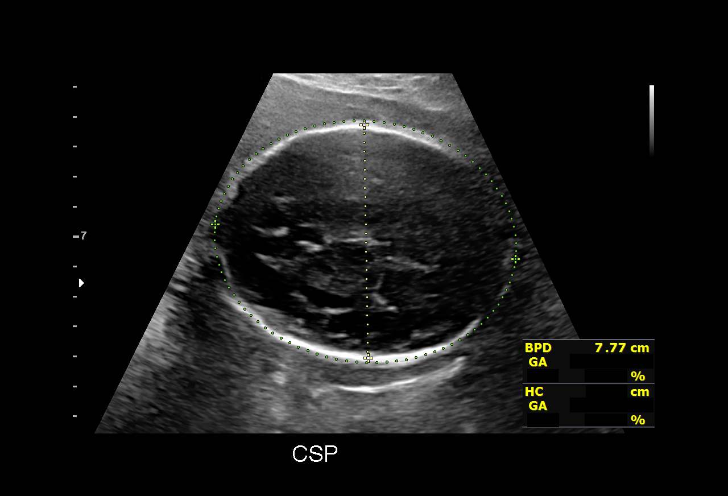
[im 7/34]
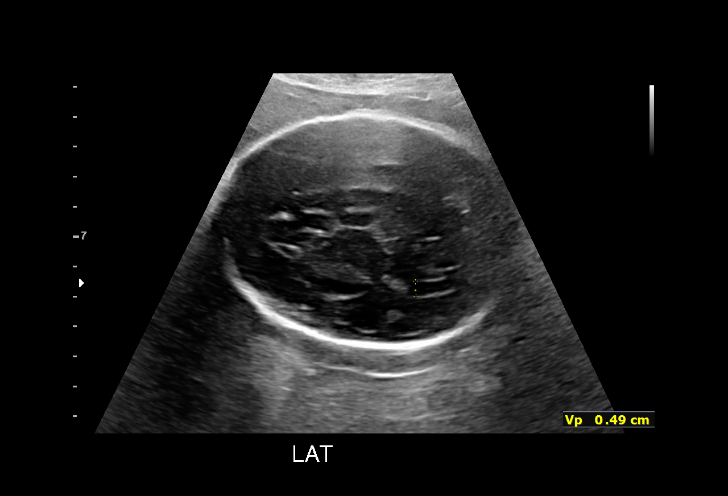
[im 9/34]
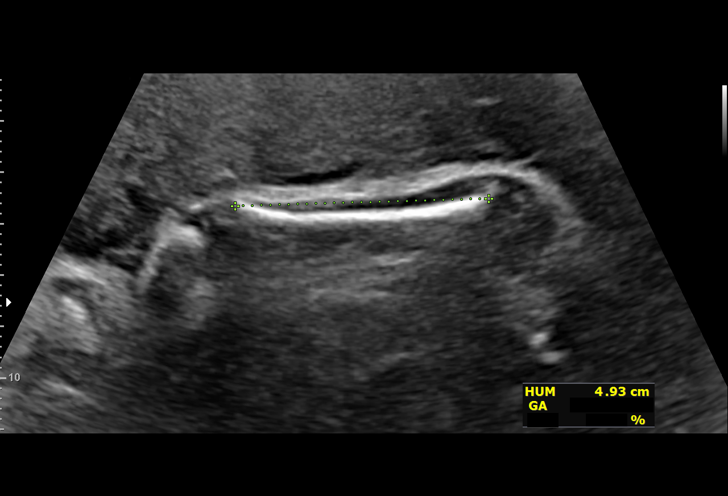
[im 12/34]
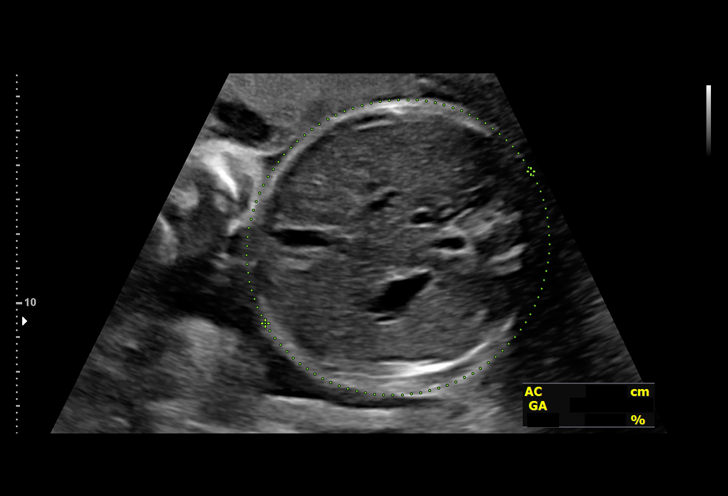
[im 14/34]
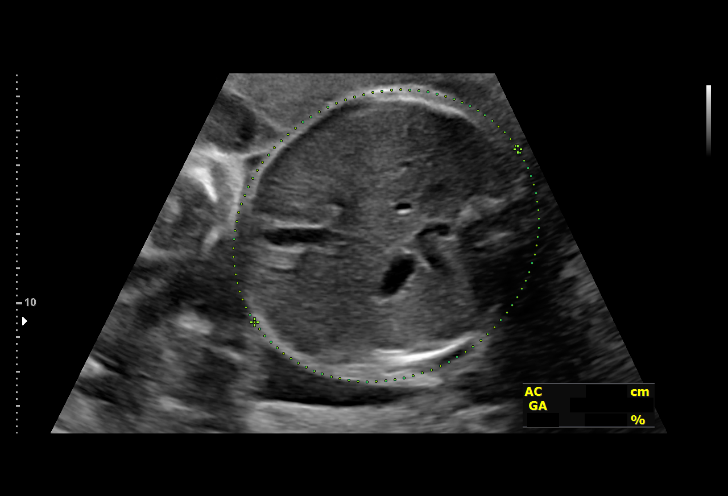
[im 16/34]
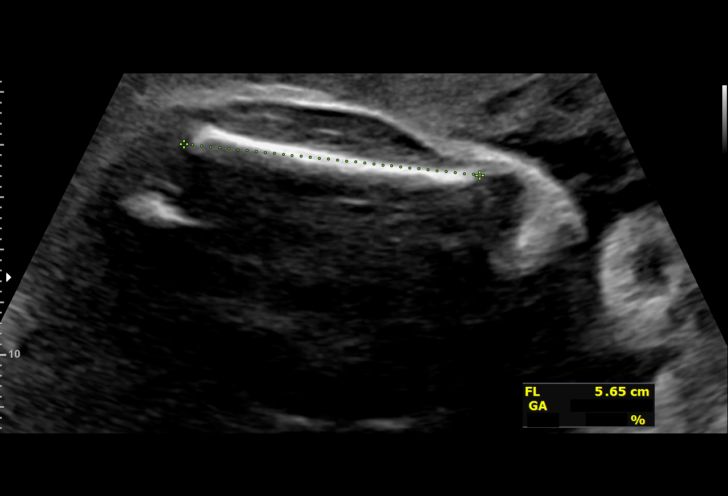
[im 19/34]
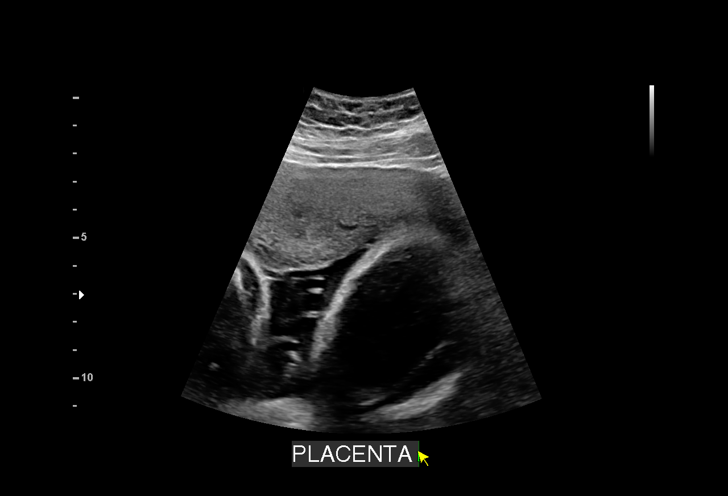
[im 21/34]
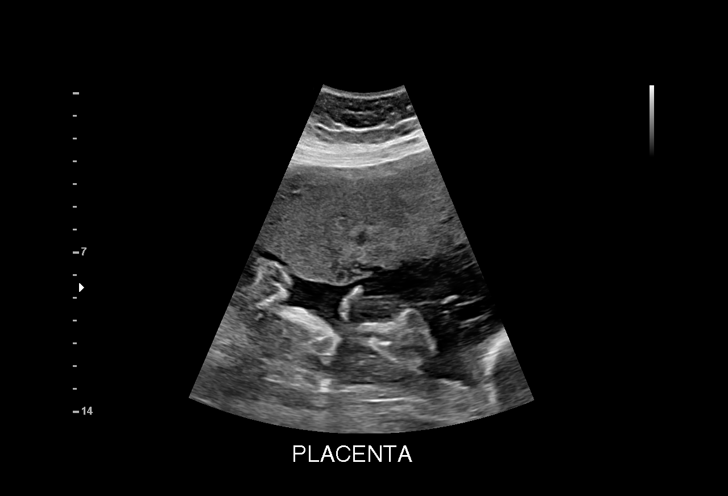
[im 24/34]
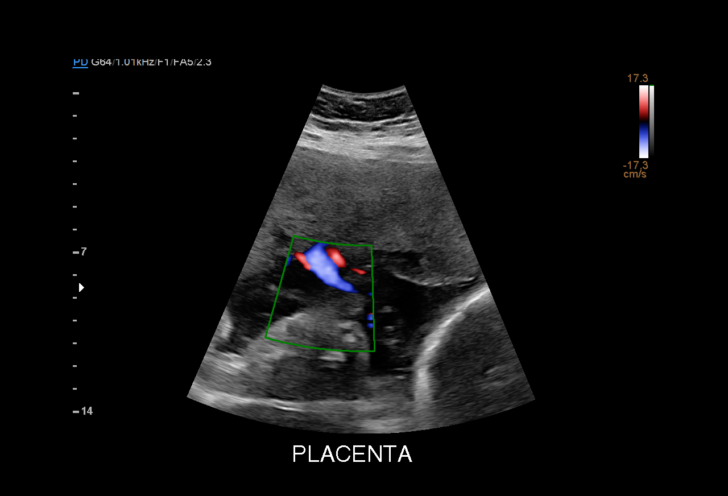
[im 26/34]
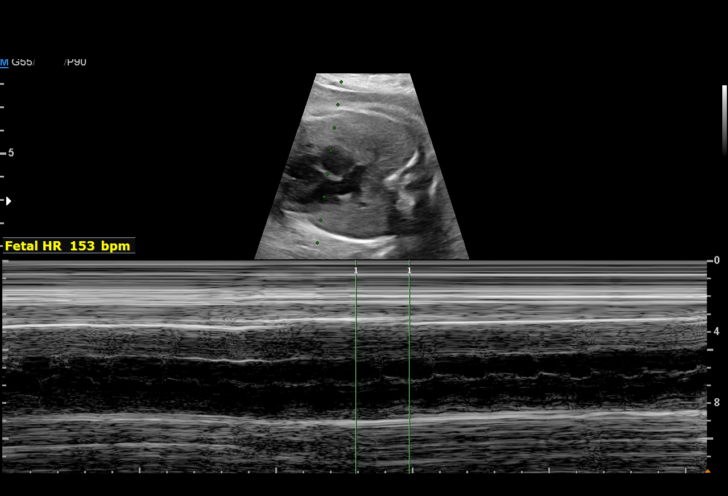
[im 29/34]
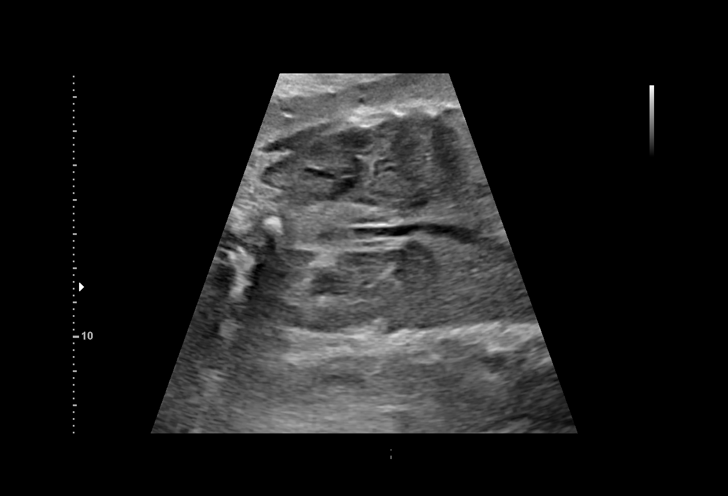
[im 31/34]
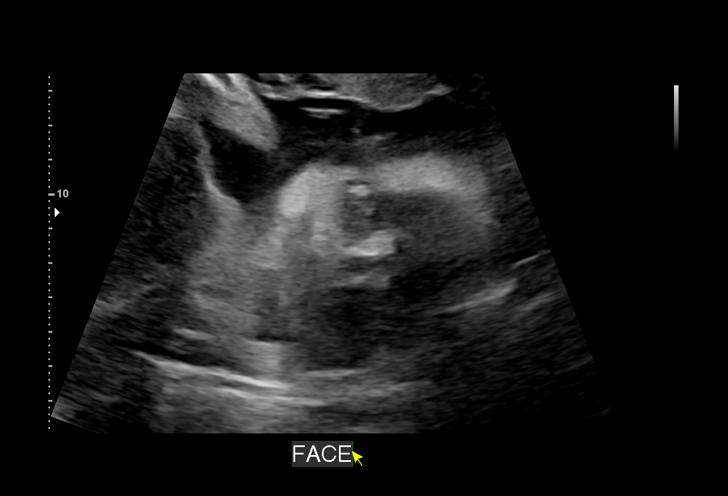
[im 34/34]
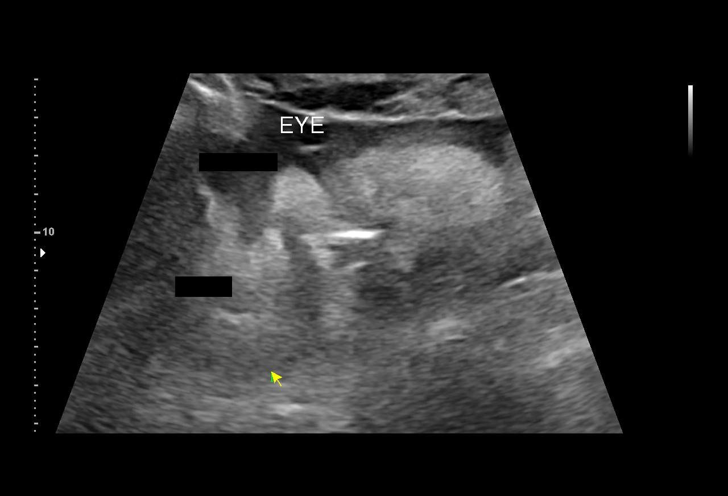

[14 of 28 positions shown; findings below may reference images not displayed]

ORUE

Indications

 Pre-existing essential hypertension            [73]
 complicating pregnancy, third trimester
 Obesity complicating pregnancy, third          [73]
 trimester
 Advanced maternal age multigravida 35+,        [73]
 third trimester
 30 weeks gestation of pregnancy
Fetal Evaluation

 Num Of Fetuses:         1
 Fetal Heart Rate(bpm):  153
 Cardiac Activity:       Observed
 Presentation:           Cephalic
 Placenta:               Anterior
 P. Cord Insertion:      Visualized, central

 Amniotic Fluid
 AFI FV:      Within normal limits

 AFI Sum(cm)     %Tile       Largest Pocket(cm)
 11              22          4

 RUQ(cm)       RLQ(cm)       LUQ(cm)        LLQ(cm)
 4             2
Biometry
 BPD:      77.9  mm     G. Age:  31w 2d         51  %    CI:        73.81   %    70 - 86
                                                         FL/HC:      19.6   %    19.3 -
 HC:       288   mm     G. Age:  31w 5d         36  %    HC/AC:      1.06        0.96 -
 AC:       271   mm     G. Age:  31w 1d         57  %    FL/BPD:     72.5   %    71 - 87
 FL:       56.5  mm     G. Age:  29w 5d         10  %    FL/AC:      20.8   %    20 - 24
 HUM:        50  mm     G. Age:  29w 2d         19  %
 LV:          5  mm

 Est. FW:    [73]  gm    3 lb 10 oz      35  %
Gestational Age

 LMP:           30w 6d        Date:  [DATE]                 EDD:   [DATE]
 U/S Today:     31w 0d                                        EDD:   [DATE]
 Best:          30w 6d     Det. By:  LMP  ([DATE])          EDD:   [DATE]
Anatomy

 Cranium:               Appears normal         Aortic Arch:            Previously seen
 Cavum:                 Appears normal         Ductal Arch:            Not well visualized
 Ventricles:            Appears normal         Diaphragm:              Previously seen
 Choroid Plexus:        Appears normal         Stomach:                Appears normal, left
                                                                       sided
 Cerebellum:            Appears normal         Abdomen:                Previously seen
 Posterior Fossa:       Appears normal         Abdominal Wall:         Previously seen
 Nuchal Fold:           Not applicable (>20    Cord Vessels:           Previously seen
                        wks GA)
 Face:                  Profile previously     Kidneys:                Appear normal
                        seen
 Lips:                  Previously seen        Bladder:                Appears normal
 Heart:                 Appears normal         Spine:                  Previously seen
                        (4CH, axis, and
                        situs)
 RVOT:                  Previously seen        Upper Extremities:      Previously seen
 LVOT:                  Previously seen        Lower Extremities:      Previously seen
Impression

 Chronic hypertension.  Patient takes labetalol 200 mg twice
 daily.  Blood pressures today at her office were 149/73 and
 repeat 117/69 mmHg.
 She does not have gestational diabetes.

 Fetal growth is appropriate for gestational age.  Amniotic fluid
 is normal and good fetal activity seen.  Cephalic presentation.

 I reassured the patient of the findings.
Recommendations

 - BPP in 3 weeks and then weekly till delivery.
                 ORUE

## 2021-01-27 ENCOUNTER — Other Ambulatory Visit: Payer: Self-pay

## 2021-01-27 ENCOUNTER — Ambulatory Visit (INDEPENDENT_AMBULATORY_CARE_PROVIDER_SITE_OTHER): Payer: BC Managed Care – PPO | Admitting: Obstetrics and Gynecology

## 2021-01-27 VITALS — BP 122/79 | Wt 238.0 lb

## 2021-01-27 DIAGNOSIS — O10019 Pre-existing essential hypertension complicating pregnancy, unspecified trimester: Secondary | ICD-10-CM

## 2021-01-27 DIAGNOSIS — Z3A31 31 weeks gestation of pregnancy: Secondary | ICD-10-CM

## 2021-01-27 DIAGNOSIS — O0990 Supervision of high risk pregnancy, unspecified, unspecified trimester: Secondary | ICD-10-CM

## 2021-01-27 NOTE — Progress Notes (Signed)
ROB - no concerns. RM 2 

## 2021-01-27 NOTE — Progress Notes (Signed)
Routine Prenatal Care Visit  Subjective  Brenda [name] is a 40 y.o. G3P1011 at [redacted]w[redacted]d being seen today for ongoing prenatal care.  She is currently monitored for the following issues for this high-risk pregnancy and has Adult ADHD; Blood in stool; Rectal polyp; Heartburn; Nausea; Epigastric abdominal pain; Abdominal pain; Gastroesophageal reflux disease; Essential hypertension; Performance anxiety; Supervision of high risk pregnancy, antepartum; Antepartum multigravida of advanced maternal age; Maternal obesity, antepartum; and Pre-existing essential hypertension during pregnancy, antepartum on their problem list.  ----------------------------------------------------------------------------------- Patient reports no complaints.   Contractions: Not present. Vag. Bleeding: None.  Movement: Present. Denies leaking of fluid.  ----------------------------------------------------------------------------------- The following portions of the patient's history were reviewed and updated as appropriate: allergies, current medications, past family history, past medical history, past social history, past surgical history and problem list. Problem list updated.   Objective  Blood pressure 122/79, weight 238 lb (108 kg), last menstrual period 06/18/2020. Pregravid weight 190 lb (86.2 kg) Total Weight Gain 48 lb (21.8 kg) Urinalysis:      Fetal Status: Fetal Heart Rate (bpm): 140 Fundal Height: 32 cm Movement: Present  Presentation: Vertex  General:  Alert, oriented and cooperative. Patient is in no acute distress.  Skin: Skin is warm and dry. No rash noted.   Cardiovascular: Normal heart rate noted  Respiratory: Normal respiratory effort, no problems with respiration noted  Abdomen: Soft, gravid, appropriate for gestational age. Pain/Pressure: Absent     Pelvic:  Cervical exam deferred        Extremities: Normal range of motion.     ental Status: Normal mood and affect. Normal behavior. Normal  judgment and thought content.   [place] MFM OB FOLLOW UP  Result Date: 01/20/2021 ----------------------------------------------------------------------  OBSTETRICS REPORT                       (Signed Final 01/20/2021 02:44 pm) ---------------------------------------------------------------------- Patient Info  ID #:       [id number]                          D.O.B.:  1980/11/12 (40 yrs)  Name:       Brenda [name]            Visit Date: 01/20/2021 02:05 pm ---------------------------------------------------------------------- Performed By  Attending:        Noralee Space MD        Ref. Address:     [address], [place],                                                             [place] [address]  Performed By:     Lorn Junes,           Location:         [facility]                                                             [facility]  Referred By:      Vena Austria MD ---------------------------------------------------------------------- Orders  #  Description                           Code        Ordered By  1  [place] MFM OB FOLLOW UP                   85027.74    Lin Landsman ----------------------------------------------------------------------  #  Order #                     Accession #                Episode #  1  [id number]                   [id number]                 [id number] ---------------------------------------------------------------------- Indications  Pre-existing essential hypertension            O10.013  complicating pregnancy, third trimester  Obesity complicating pregnancy, third          O99.213  trimester  Advanced maternal age multigravida 52+,        O30.523  third trimester  [redacted] weeks gestation of pregnancy                Z3A.30  ---------------------------------------------------------------------- Fetal Evaluation  Num Of Fetuses:         1  Fetal Heart Rate(bpm):  153  Cardiac Activity:       Observed  Presentation:           Cephalic  Placenta:               Anterior  P. Cord Insertion:      Visualized, central  Amniotic Fluid  AFI FV:      Within normal limits  AFI Sum(cm)     %Tile       Largest Pocket(cm)  11              22          4   RUQ(cm)       RLQ(cm)       LUQ(cm)        LLQ(cm)  4             2  3.2            1.8 ---------------------------------------------------------------------- Biometry  BPD:      77.9  mm     G. Age:  31w 2d         51  %    CI:        73.81   %    70 - 86                                                          FL/HC:      19.6   %    19.3 - 21.3  HC:       288   mm     G. Age:  31w 5d         36  %    HC/AC:      1.06        0.96 - 1.17  AC:       271   mm     G. Age:  31w 1d         57  %    FL/BPD:     72.5   %    71 - 87  FL:       56.5  mm     G. Age:  29w 5d         10  %    FL/AC:      20.8   %    20 - 24  HUM:        50  mm     G. Age:  29w 2d         19  %  LV:          5  mm  Est. FW:    1636  gm    3 lb 10 oz      35  % ---------------------------------------------------------------------- Gestational Age  LMP:           30w 6d        Date:  06/18/20                 EDD:   03/25/21  U/S Today:     31w 0d                                        EDD:   03/24/21  Best:          30w 6d     Det. By:  LMP  (06/18/20)          EDD:   03/25/21 ---------------------------------------------------------------------- Anatomy  Cranium:               Appears normal         Aortic Arch:            Previously seen  Cavum:                 Appears normal         Ductal Arch:            Not well visualized  Ventricles:            Appears normal  Diaphragm:              Previously seen  Choroid Plexus:        Appears normal         Stomach:                Appears normal, left                                                                         sided  Cerebellum:            Appears normal         Abdomen:                Previously seen  Posterior Fossa:       Appears normal         Abdominal Wall:         Previously seen  Nuchal Fold:           Not applicable (>20    Cord Vessels:           Previously seen                         wks GA)  Face:                  Profile previously     Kidneys:                Appear normal                         seen  Lips:                  Previously seen        Bladder:                Appears normal  Heart:                 Appears normal         Spine:                  Previously seen                         (4CH, axis, and                         situs)  RVOT:                  Previously seen        Upper Extremities:      Previously seen  LVOT:                  Previously seen        Lower Extremities:      Previously seen ---------------------------------------------------------------------- Impression  Chronic hypertension.  Patient takes labetalol 200 mg twice  daily.  Blood pressures today at her office were 149/73 and  repeat 117/69 mmHg.  She does not have gestational diabetes.  Fetal growth is appropriate for gestational age.  Amniotic fluid  is normal and good fetal activity  seen.  Cephalic presentation.  I reassured the patient of the findings. ---------------------------------------------------------------------- Recommendations  - BPP in 3 weeks and then weekly till delivery. ----------------------------------------------------------------------                  Noralee Space, MD Electronically Signed Final Report   01/20/2021 02:44 pm ----------------------------------------------------------------------    Assessment   40 y.o. G3P1011 at [redacted]w[redacted]d by  03/25/2021, by Last Menstrual Period presenting for routine prenatal visit  Plan   pregnancy 3 Problems (from 08/05/20 to present)     Problem Noted Resolved   Supervision of high risk pregnancy,  antepartum 08/05/2020 by Vena Austria, MD No   Overview Addendum 01/13/2021  1:12 PM by Vena Austria, MD    [facility] Prenatal Labs  Dating LMP = 9 week [place] Blood type: O/Positive/-- (02/10 1202)   Genetic Screen NIPS: Normal XY Antibody:Negative (02/10 1202)  Anatomic [place] Normal 5/24 MFM Rubella: 1.64 (02/10 1202) Varicella: Immune  GTT Third trimester: 105 RPR: Non Reactive (02/10 1202)   Rhogam N/A HBsAg: Negative (02/10 1202)   TDaP vaccine                       Flu Shot: HIV: Non Reactive (02/10 1202)   Baby Food                                GBS:   Contraception  Pap: 08/05/2020 ASCUS HPV negative  CBB     CS/VBAC N/A   Support Person Husband Quint            Antepartum multigravida of advanced maternal age 62/03/2021 by Vena Austria, MD No   Maternal obesity, antepartum 08/05/2020 by Vena Austria, MD No   Pre-existing essential hypertension during pregnancy, antepartum 08/05/2020 by Vena Austria, MD No        Gestational age appropriate obstetric precautions including but not limited to vaginal bleeding, contractions, leaking of fluid and fetal movement were reviewed in detail with the patient.    Return in about 2 weeks (around 02/10/2021) for ROB/NST weekly going foward for the next 4 weeks.  Vena Austria, MD, [facility], [facility] 01/27/2021, 11:20 AM

## 2021-02-08 ENCOUNTER — Other Ambulatory Visit: Payer: Self-pay | Admitting: Obstetrics and Gynecology

## 2021-02-08 DIAGNOSIS — O10919 Unspecified pre-existing hypertension complicating pregnancy, unspecified trimester: Secondary | ICD-10-CM

## 2021-02-08 DIAGNOSIS — O09523 Supervision of elderly multigravida, third trimester: Secondary | ICD-10-CM

## 2021-02-08 DIAGNOSIS — O99213 Obesity complicating pregnancy, third trimester: Secondary | ICD-10-CM

## 2021-02-10 ENCOUNTER — Other Ambulatory Visit: Payer: Self-pay

## 2021-02-10 ENCOUNTER — Ambulatory Visit: Payer: BC Managed Care – PPO | Attending: Maternal & Fetal Medicine

## 2021-02-10 VITALS — BP 116/91 | HR 81 | Temp 97.9°F | Ht 64.0 in | Wt 241.5 lb

## 2021-02-10 DIAGNOSIS — O99210 Obesity complicating pregnancy, unspecified trimester: Secondary | ICD-10-CM

## 2021-02-10 DIAGNOSIS — Z3A33 33 weeks gestation of pregnancy: Secondary | ICD-10-CM | POA: Diagnosis not present

## 2021-02-10 DIAGNOSIS — O10913 Unspecified pre-existing hypertension complicating pregnancy, third trimester: Secondary | ICD-10-CM | POA: Diagnosis not present

## 2021-02-10 DIAGNOSIS — O10013 Pre-existing essential hypertension complicating pregnancy, third trimester: Secondary | ICD-10-CM | POA: Insufficient documentation

## 2021-02-10 DIAGNOSIS — O99213 Obesity complicating pregnancy, third trimester: Secondary | ICD-10-CM | POA: Diagnosis not present

## 2021-02-10 DIAGNOSIS — O10919 Unspecified pre-existing hypertension complicating pregnancy, unspecified trimester: Secondary | ICD-10-CM

## 2021-02-10 DIAGNOSIS — O09523 Supervision of elderly multigravida, third trimester: Secondary | ICD-10-CM | POA: Insufficient documentation

## 2021-02-10 DIAGNOSIS — O10019 Pre-existing essential hypertension complicating pregnancy, unspecified trimester: Secondary | ICD-10-CM

## 2021-02-10 DIAGNOSIS — O0990 Supervision of high risk pregnancy, unspecified, unspecified trimester: Secondary | ICD-10-CM

## 2021-02-10 DIAGNOSIS — O09529 Supervision of elderly multigravida, unspecified trimester: Secondary | ICD-10-CM

## 2021-02-10 IMAGING — US US MFM FETAL BPP W/O NON-STRESS
1 series · 14 of 17 positions shown · non-contrast
Comparison: none

[Series 1: us mfm fetal bpp w/o non-stress · 17 acquisitions, 14 frames shown]
[im 1/17]
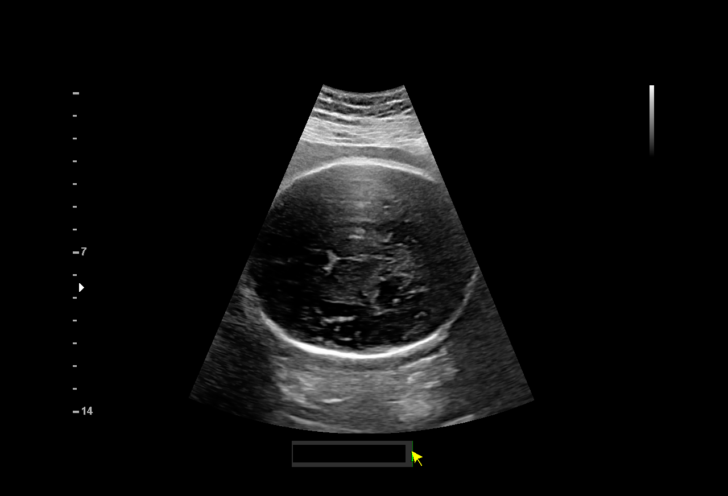
[im 2/17]
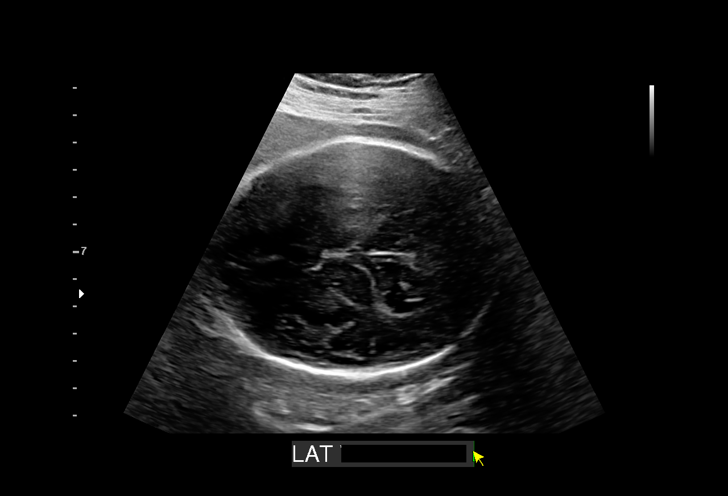
[im 4/17]
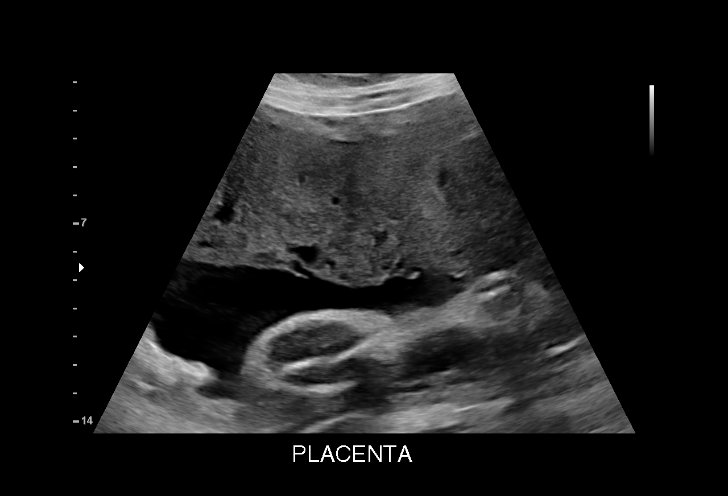
[im 5/17]
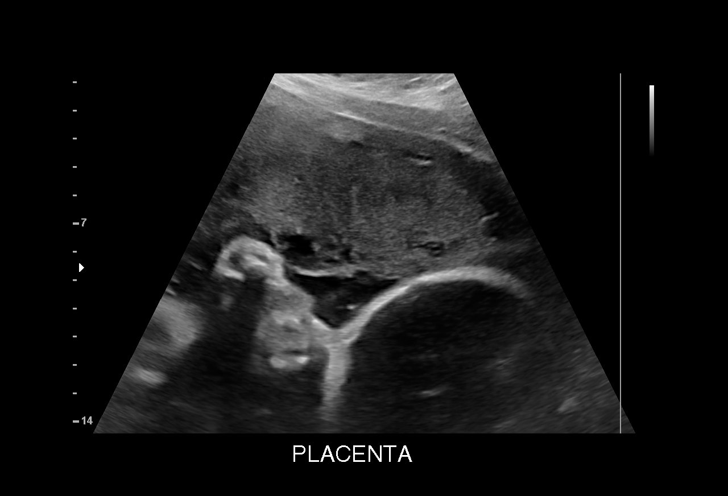
[im 6/17]
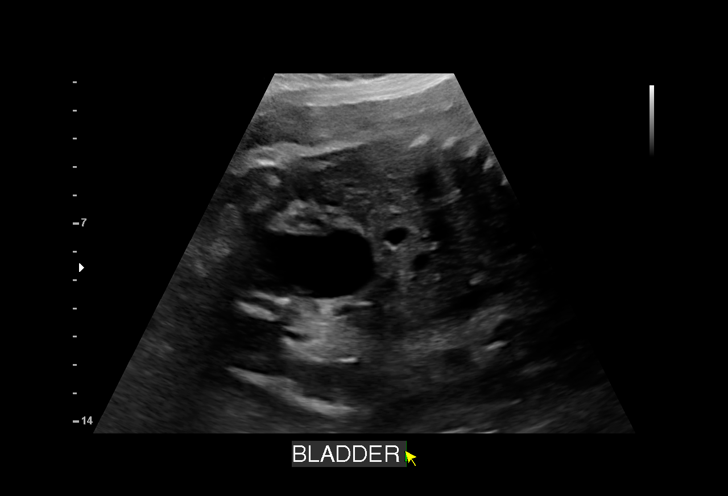
[im 7/17]
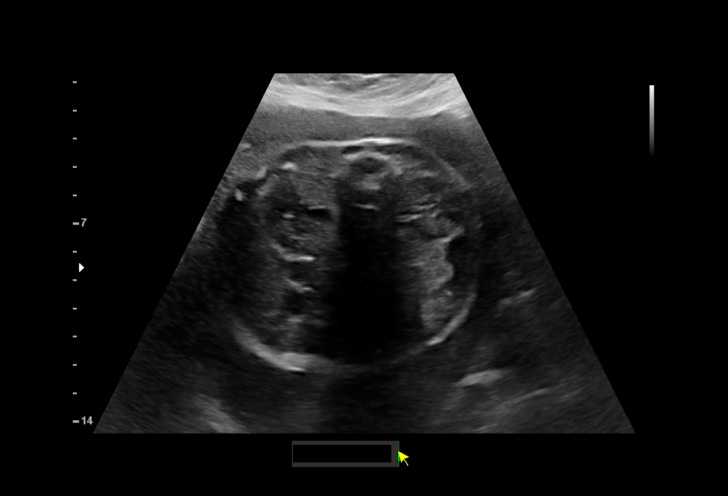
[im 8/17]
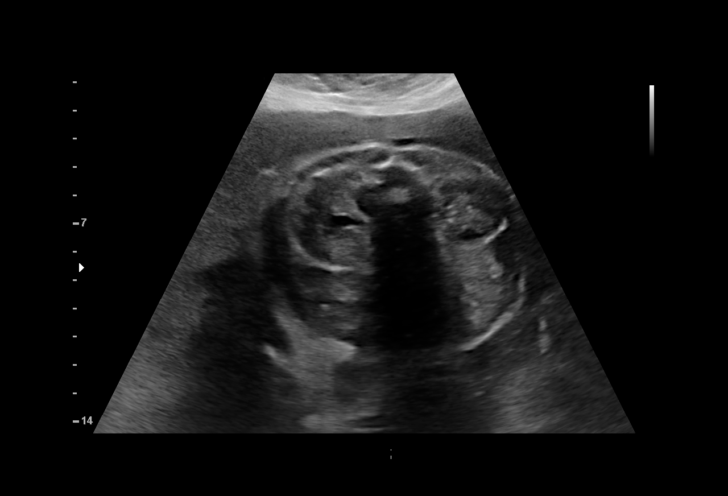
[im 10/17]
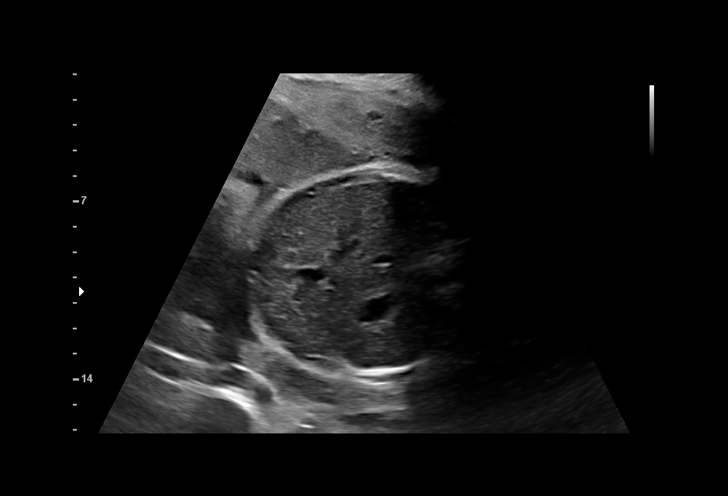
[im 11/17]
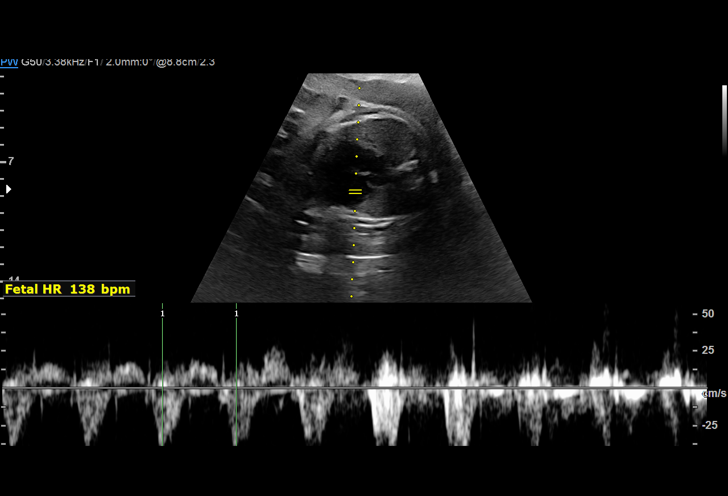
[im 12/17]
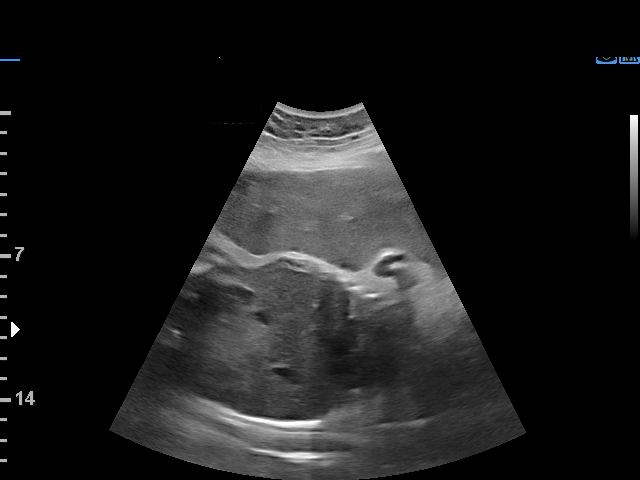
[im 13/17]
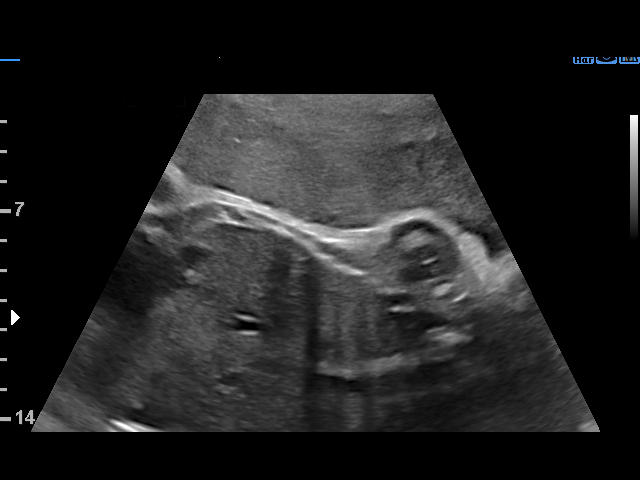
[im 14/17]
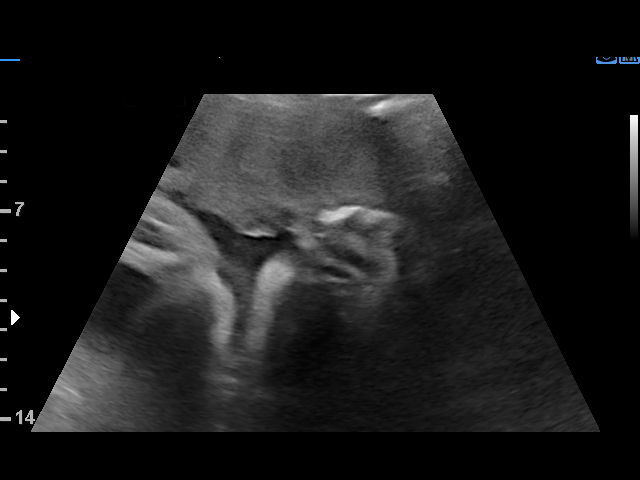
[im 16/17]
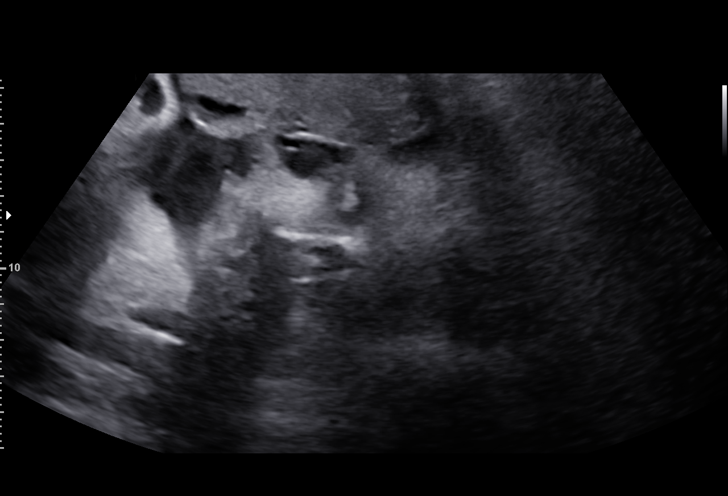
[im 17/17]
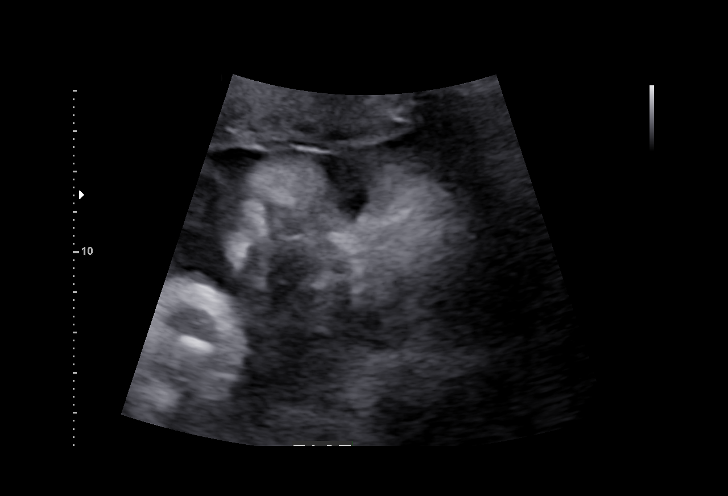

[14 of 17 positions shown; findings below may reference images not displayed]

Indications

 Pre-existing essential hypertension            [NP]
 complicating pregnancy, third trimester
 Obesity complicating pregnancy, third          [NP]
 trimester
 Advanced maternal age multigravida 35+,        [NP]
 third trimester
 33 weeks gestation of pregnancy
Fetal Evaluation

 Num Of Fetuses:         1
 Fetal Heart Rate(bpm):  138
 Cardiac Activity:       Observed
 Presentation:           Cephalic
 Placenta:               Anterior

 Amniotic Fluid
 AFI FV:      Within normal limits

 AFI Sum(cm)     %Tile       Largest Pocket(cm)
 12.9            40

 RUQ(cm)       RLQ(cm)       LUQ(cm)        LLQ(cm)

Biophysical Evaluation
 Amniotic F.V:   Within normal limits       F. Tone:        Observed
 F. Movement:    Observed                   Score:          [DATE]
 F. Breathing:   Observed
Gestational Age

 LMP:           33w 6d        Date:  [DATE]                 EDD:   [DATE]
 Best:          33w 6d     Det. By:  LMP  ([DATE])          EDD:   [DATE]
Anatomy

 Cranium:               Appears normal         Ductal Arch:            Previously seen
 Cavum:                 Appears normal         Diaphragm:              Appears normal
 Ventricles:            Appears normal         Stomach:                Appears normal, left
                                                                       sided
 Choroid Plexus:        Previously seen        Abdomen:                Appears normal
 Cerebellum:            Previously seen        Abdominal Wall:         Previously seen
 Posterior Fossa:       Previously seen        Cord Vessels:           Appears normal (3
                                                                       vessel cord)
 Face:                  Orbits and profile     Kidneys:                Appear normal
                        previously seen
 Lips:                  Previously seen        Bladder:                Appears normal
 Heart:                 Previously seen        Spine:                  Previously seen
 RVOT:                  Previously seen        Upper Extremities:      Previously seen
 LVOT:                  Previously seen        Lower Extremities:      Previously seen
 Aortic Arch:           Previously seen
Impression

 Antenatal testing performed given maternal chronic
 hypertension on medication.
 The biophysical profile was [DATE] with good fetal movement and
 amniotic fluid volume.

 Blood pressure 116/91 mmHg.
Recommendations

 Continue weekly testing.
 Follow up growth in 2 weeks.

## 2021-02-13 ENCOUNTER — Other Ambulatory Visit: Payer: Self-pay | Admitting: Obstetrics

## 2021-02-13 DIAGNOSIS — K219 Gastro-esophageal reflux disease without esophagitis: Secondary | ICD-10-CM

## 2021-02-14 ENCOUNTER — Other Ambulatory Visit: Payer: Self-pay | Admitting: Maternal & Fetal Medicine

## 2021-02-14 DIAGNOSIS — O09523 Supervision of elderly multigravida, third trimester: Secondary | ICD-10-CM

## 2021-02-14 DIAGNOSIS — O10919 Unspecified pre-existing hypertension complicating pregnancy, unspecified trimester: Secondary | ICD-10-CM

## 2021-02-14 DIAGNOSIS — O99213 Obesity complicating pregnancy, third trimester: Secondary | ICD-10-CM

## 2021-02-15 ENCOUNTER — Ambulatory Visit (INDEPENDENT_AMBULATORY_CARE_PROVIDER_SITE_OTHER): Payer: BC Managed Care – PPO | Admitting: Obstetrics and Gynecology

## 2021-02-15 ENCOUNTER — Other Ambulatory Visit: Payer: Self-pay

## 2021-02-15 VITALS — BP 122/72 | Wt 242.0 lb

## 2021-02-15 DIAGNOSIS — Z23 Encounter for immunization: Secondary | ICD-10-CM | POA: Diagnosis not present

## 2021-02-15 DIAGNOSIS — O99210 Obesity complicating pregnancy, unspecified trimester: Secondary | ICD-10-CM

## 2021-02-15 DIAGNOSIS — O09529 Supervision of elderly multigravida, unspecified trimester: Secondary | ICD-10-CM | POA: Diagnosis not present

## 2021-02-15 DIAGNOSIS — O0990 Supervision of high risk pregnancy, unspecified, unspecified trimester: Secondary | ICD-10-CM

## 2021-02-15 DIAGNOSIS — O10019 Pre-existing essential hypertension complicating pregnancy, unspecified trimester: Secondary | ICD-10-CM

## 2021-02-15 DIAGNOSIS — Z3A34 34 weeks gestation of pregnancy: Secondary | ICD-10-CM

## 2021-02-15 DIAGNOSIS — I10 Essential (primary) hypertension: Secondary | ICD-10-CM | POA: Diagnosis not present

## 2021-02-15 LAB — POCT URINALYSIS DIPSTICK OB
Glucose, UA: NEGATIVE
POC,PROTEIN,UA: NEGATIVE

## 2021-02-15 NOTE — Progress Notes (Signed)
Routine Prenatal Care Visit  Subjective  Brenda [name] is a 40 y.o. G3P1011 at [redacted]w[redacted]d being seen today for ongoing prenatal care.  She is currently monitored for the following issues for this high-risk pregnancy and has Adult ADHD; Blood in stool; Rectal polyp; Heartburn; Nausea; Epigastric abdominal pain; Abdominal pain; Gastroesophageal reflux disease; Essential hypertension; Performance anxiety; Supervision of high risk pregnancy, antepartum; Antepartum multigravida of advanced maternal age; Maternal obesity, antepartum; and Pre-existing essential hypertension during pregnancy, antepartum on their problem list.  ----------------------------------------------------------------------------------- Patient reports no complaints.   Contractions: Not present. Vag. Bleeding: None.  Movement: Present. Denies leaking of fluid.  ----------------------------------------------------------------------------------- The following portions of the patient's history were reviewed and updated as appropriate: allergies, current medications, past family history, past medical history, past social history, past surgical history and problem list. Problem list updated.   Objective  Blood pressure 122/72, weight 242 lb (109.8 kg), last menstrual period 06/18/2020. Pregravid weight 190 lb (86.2 kg) Total Weight Gain 52 lb (23.6 kg) Urinalysis:      Fetal Status: Fetal Heart Rate (bpm): 145   Movement: Present  Presentation: Vertex  General:  Alert, oriented and cooperative. Patient is in no acute distress.  Skin: Skin is warm and dry. No rash noted.   Cardiovascular: Normal heart rate noted  Respiratory: Normal respiratory effort, no problems with respiration noted  Abdomen: Soft, gravid, appropriate for gestational age. Pain/Pressure: Absent     Pelvic:  Cervical exam deferred        Extremities: Normal range of motion.     ental Status: Normal mood and affect. Normal behavior. Normal judgment and  thought content.   .Baseline: 145 Variability: moderate Accelerations: present Decelerations: absent Tocometry: N/A The patient was monitored for 30 minutes, fetal heart rate tracing was deemed reactive, category I tracing,  CPT 59025   [place] MFM FETAL BPP WO NON STRESS  Result Date: 02/10/2021 ----------------------------------------------------------------------  OBSTETRICS REPORT                       (Signed Final 02/10/2021 09:01 am) ---------------------------------------------------------------------- Patient Info  ID #:       [id number]                          D.O.B.:  1981/05/11 (40 yrs)  Name:       Brenda [name]            Visit Date: 02/10/2021 07:47 am ---------------------------------------------------------------------- Performed By  Attending:        Lin Landsman      Ref. Address:     [address]                    MD                                                             [place], [place],                                                             [place] [address]  Performed By:     Lorn Junes,           Location:         [facility]                    RDMS, RDCS                               Fetal Care at                                                             [facility]  Referred By:      Vena Austria MD ---------------------------------------------------------------------- Orders  #  Description                           Code        Ordered By  1  [place] MFM FETAL BPP WO NON               76819.01    [facility]     STRESS ----------------------------------------------------------------------  #  Order #                     Accession #                Episode #  1  [id number]                   [id number]                 [id number] ---------------------------------------------------------------------- Indications  Pre-existing essential hypertension            O10.013  complicating pregnancy, third trimester  Obesity complicating pregnancy,  third          O99.213  trimester  Advanced maternal age multigravida 18+,        O48.523  third trimester  [redacted] weeks gestation of pregnancy                Z3A.33 ---------------------------------------------------------------------- Fetal Evaluation  Num Of Fetuses:         1  Fetal Heart Rate(bpm):  138  Cardiac Activity:       Observed  Presentation:           Cephalic  Placenta:               Anterior  Amniotic Fluid  AFI FV:      Within normal limits  AFI Sum(cm)     %Tile       Largest Pocket(cm)  12.9            40          4.4  RUQ(cm)       RLQ(cm)       LUQ(cm)        LLQ(cm)  2.2           2.4           4.4            3.9 ---------------------------------------------------------------------- Biophysical Evaluation  Amniotic F.V:  Within normal limits       F. Tone:        Observed  F. Movement:    Observed                   Score:          8/8  F. Breathing:   Observed ---------------------------------------------------------------------- Gestational Age  LMP:           33w 6d        Date:  06/18/20                 EDD:   03/25/21  Best:          33w 6d     Det. By:  LMP  (06/18/20)          EDD:   03/25/21 ---------------------------------------------------------------------- Anatomy  Cranium:               Appears normal         Ductal Arch:            Previously seen  Cavum:                 Appears normal         Diaphragm:              Appears normal  Ventricles:            Appears normal         Stomach:                Appears normal, left                                                                        sided  Choroid Plexus:        Previously seen        Abdomen:                Appears normal  Cerebellum:            Previously seen        Abdominal Wall:         Previously seen  Posterior Fossa:       Previously seen        Cord Vessels:           Appears normal (3                                                                        vessel cord)  Face:                  Orbits and profile      Kidneys:                Appear normal                         previously seen  Lips:  Previously seen        Bladder:                Appears normal  Heart:                 Previously seen        Spine:                  Previously seen  RVOT:                  Previously seen        Upper Extremities:      Previously seen  LVOT:                  Previously seen        Lower Extremities:      Previously seen  Aortic Arch:           Previously seen ---------------------------------------------------------------------- Impression  Antenatal testing performed given maternal chronic  hypertension on medication.  The biophysical profile was 8/8 with good fetal movement and  amniotic fluid volume.  Blood pressure 116/91 mmHg. ---------------------------------------------------------------------- Recommendations  Continue weekly testing.  Follow up growth in 2 weeks. ----------------------------------------------------------------------               Lin Landsman, MD Electronically Signed Final Report   02/10/2021 09:01 am ----------------------------------------------------------------------  [place] MFM OB FOLLOW UP  Result Date: 01/20/2021 ----------------------------------------------------------------------  OBSTETRICS REPORT                       (Signed Final 01/20/2021 02:44 pm) ---------------------------------------------------------------------- Patient Info  ID #:       [id number]                          D.O.B.:  05/13/1981 (40 yrs)  Name:       Koleen Nimrod TATE [name]            Visit Date: 01/20/2021 02:05 pm ---------------------------------------------------------------------- Performed By  Attending:        Noralee Space MD        Ref. Address:     [address], [place],                                                             [place] [address]  Performed By:     Lorn Junes,           Location:         [facility]                     RDMS, RDCS                               Fetal Care at  [facility]  Referred By:      Vena Austria MD ---------------------------------------------------------------------- Orders  #  Description                           Code        Ordered By  1  [place] MFM OB FOLLOW UP                   40981.19    Lin Landsman ----------------------------------------------------------------------  #  Order #                     Accession #                Episode #  1  [id number]                   [id number]                 [id number] ---------------------------------------------------------------------- Indications  Pre-existing essential hypertension            O10.013  complicating pregnancy, third trimester  Obesity complicating pregnancy, third          O99.213  trimester  Advanced maternal age multigravida 29+,        O19.523  third trimester  [redacted] weeks gestation of pregnancy                Z3A.30 ---------------------------------------------------------------------- Fetal Evaluation  Num Of Fetuses:         1  Fetal Heart Rate(bpm):  153  Cardiac Activity:       Observed  Presentation:           Cephalic  Placenta:               Anterior  P. Cord Insertion:      Visualized, central  Amniotic Fluid  AFI FV:      Within normal limits  AFI Sum(cm)     %Tile       Largest Pocket(cm)  RUQ(cm)       RLQ(cm)       LUQ(cm)        LLQ(cm)  4             2             3.2            1.8 ---------------------------------------------------------------------- Biometry  BPD:      77.9  mm     G. Age:  31w 2d         51  %    CI:        73.81   %    70 - 86  FL/HC:      19.6   %    19.3 - 21.3  HC:       288   mm     G. Age:  31w 5d         36  %    HC/AC:      1.06        0.96 - 1.17  AC:       271   mm      G. Age:  31w 1d         57  %    FL/BPD:     72.5   %    71 - 87  FL:       56.5  mm     G. Age:  29w 5d         10  %    FL/AC:      20.8   %    20 - 24  HUM:        50  mm     G. Age:  29w 2d         19  %  LV:          5  mm  Est. FW:    1636  gm    3 lb 10 oz      35  % ---------------------------------------------------------------------- Gestational Age  LMP:           30w 6d        Date:  06/18/20                 EDD:   03/25/21  U/S Today:     31w 0d                                        EDD:   03/24/21  Best:          30w 6d     Det. By:  LMP  (06/18/20)          EDD:   03/25/21 ---------------------------------------------------------------------- Anatomy  Cranium:               Appears normal         Aortic Arch:            Previously seen  Cavum:                 Appears normal         Ductal Arch:            Not well visualized  Ventricles:            Appears normal         Diaphragm:              Previously seen  Choroid Plexus:        Appears normal         Stomach:                Appears normal, left  sided  Cerebellum:            Appears normal         Abdomen:                Previously seen  Posterior Fossa:       Appears normal         Abdominal Wall:         Previously seen  Nuchal Fold:           Not applicable (>20    Cord Vessels:           Previously seen                         wks GA)  Face:                  Profile previously     Kidneys:                Appear normal                         seen  Lips:                  Previously seen        Bladder:                Appears normal  Heart:                 Appears normal         Spine:                  Previously seen                         (4CH, axis, and                         situs)  RVOT:                  Previously seen        Upper Extremities:      Previously seen  LVOT:                  Previously seen        Lower Extremities:      Previously seen  ---------------------------------------------------------------------- Impression  Chronic hypertension.  Patient takes labetalol 200 mg twice  daily.  Blood pressures today at her office were 149/73 and  repeat 117/69 mmHg.  She does not have gestational diabetes.  Fetal growth is appropriate for gestational age.  Amniotic fluid  is normal and good fetal activity seen.  Cephalic presentation.  I reassured the patient of the findings. ---------------------------------------------------------------------- Recommendations  - BPP in 3 weeks and then weekly till delivery. ----------------------------------------------------------------------                  Noralee Spaceavi Shankar, MD Electronically Signed Final Report   01/20/2021 02:44 pm ----------------------------------------------------------------------    Immunization History  Administered Date(s) Administered   Influenza,inj,Quad PF,6+ Mos 03/20/2018, 07/10/2019, 08/26/2020   PFIZER(Purple Top)SARS-COV-2 Vaccination 08/21/2019, 09/17/2019, 03/23/2020   Tdap 02/15/2021     Assessment   40 y.o. G3P1011 at 2640w4d by  03/25/2021, by Last Menstrual Period presenting for routine prenatal visit  Plan   pregnancy 3 Problems (from 08/05/20 to present)     Problem Noted  Resolved   Supervision of high risk pregnancy, antepartum 08/05/2020 by Vena Austria, MD No   Overview Addendum 01/13/2021  1:12 PM by Vena Austria, MD    [facility] Prenatal Labs  Dating LMP = 9 week [place] Blood type: O/Positive/-- (02/10 1202)   Genetic Screen NIPS: Normal XY Antibody:Negative (02/10 1202)  Anatomic [place] Normal 5/24 MFM Rubella: 1.64 (02/10 1202) Varicella: Immune  GTT Third trimester: 105 RPR: Non Reactive (02/10 1202)   Rhogam N/A HBsAg: Negative (02/10 1202)   TDaP vaccine                       Flu Shot: HIV: Non Reactive (02/10 1202)   Baby Food                                GBS:   Contraception  Pap: 08/05/2020 ASCUS HPV negative  CBB     CS/VBAC N/A    Support Person Husband Quint            Antepartum multigravida of advanced maternal age 35/03/2021 by Vena Austria, MD No   Maternal obesity, antepartum 08/05/2020 by Vena Austria, MD No   Pre-existing essential hypertension during pregnancy, antepartum 08/05/2020 by Vena Austria, MD No        Gestational age appropriate obstetric precautions including but not limited to vaginal bleeding, contractions, leaking of fluid and fetal movement were reviewed in detail with the patient.    1) CHTN  - BP remains well controlled - Growth appropriate 02/10/21 - NST reactive  2) TDAP administered  Return in about 1 week (around 02/22/2021) for ROB and NST.  Vena Austria, MD, [facility], [facility] 02/15/2021, 8:12 AM

## 2021-02-15 NOTE — Progress Notes (Signed)
ROB - NST, TDAP, no concerns. RM 5

## 2021-02-17 ENCOUNTER — Ambulatory Visit: Payer: BC Managed Care – PPO | Attending: Maternal & Fetal Medicine

## 2021-02-17 ENCOUNTER — Other Ambulatory Visit: Payer: Self-pay

## 2021-02-17 DIAGNOSIS — O10913 Unspecified pre-existing hypertension complicating pregnancy, third trimester: Secondary | ICD-10-CM | POA: Diagnosis not present

## 2021-02-17 DIAGNOSIS — E669 Obesity, unspecified: Secondary | ICD-10-CM | POA: Diagnosis not present

## 2021-02-17 DIAGNOSIS — Z3A34 34 weeks gestation of pregnancy: Secondary | ICD-10-CM

## 2021-02-17 DIAGNOSIS — O10013 Pre-existing essential hypertension complicating pregnancy, third trimester: Secondary | ICD-10-CM | POA: Insufficient documentation

## 2021-02-17 DIAGNOSIS — O09523 Supervision of elderly multigravida, third trimester: Secondary | ICD-10-CM | POA: Diagnosis not present

## 2021-02-17 DIAGNOSIS — O10919 Unspecified pre-existing hypertension complicating pregnancy, unspecified trimester: Secondary | ICD-10-CM

## 2021-02-17 DIAGNOSIS — O99213 Obesity complicating pregnancy, third trimester: Secondary | ICD-10-CM | POA: Diagnosis not present

## 2021-02-17 IMAGING — US US MFM OB FOLLOW-UP
1 series · 14 of 28 positions shown · non-contrast
Comparison: none

[Series 1: us mfm ob follow-up · 39 acquisitions, 14 frames shown]
[im 2/39]
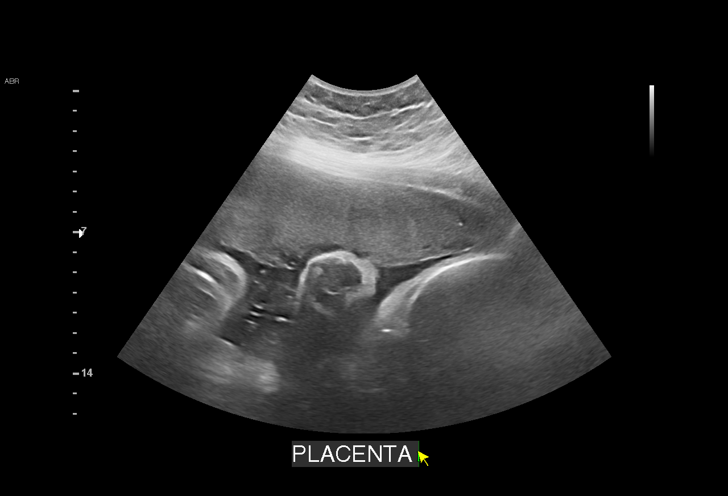
[im 5/39]
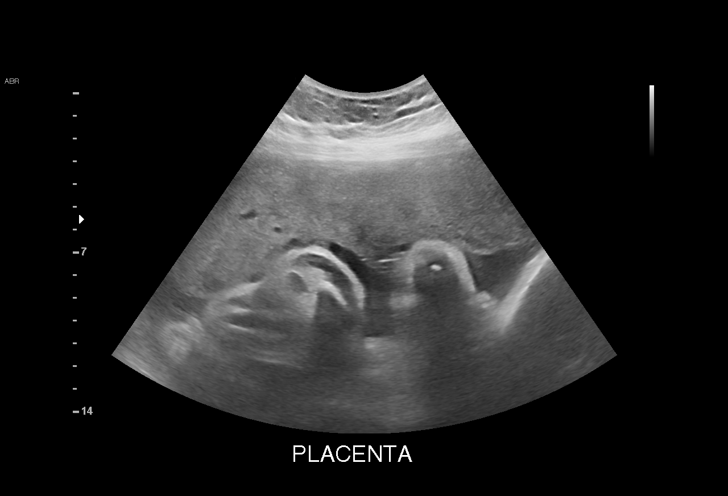
[im 8/39]
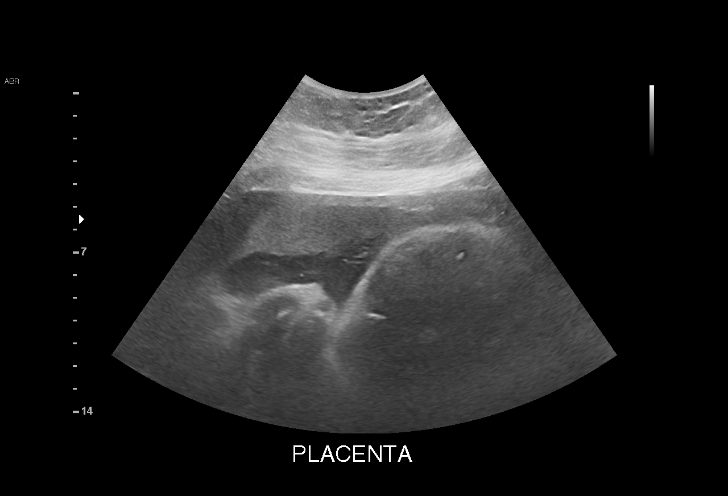
[im 10/39]
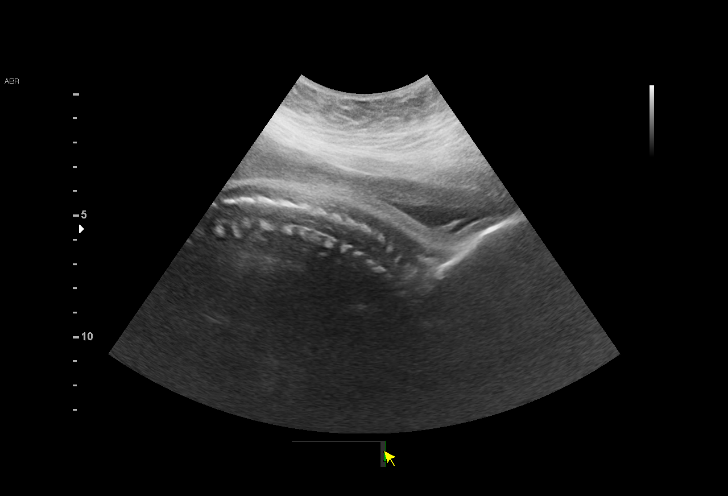
[im 13/39]
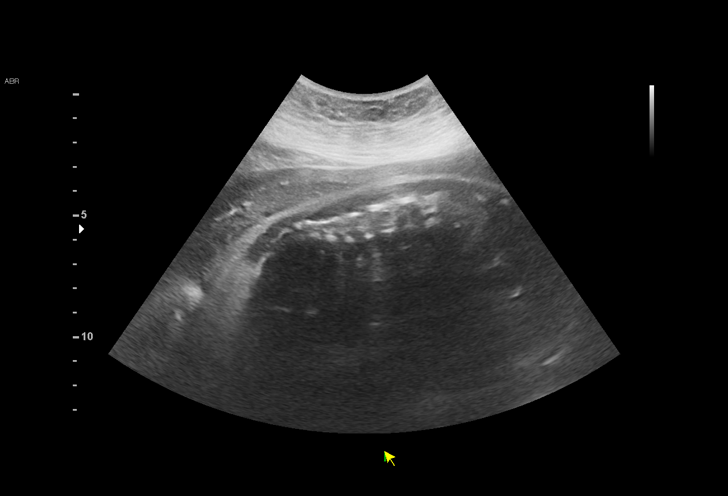
[im 16/39]
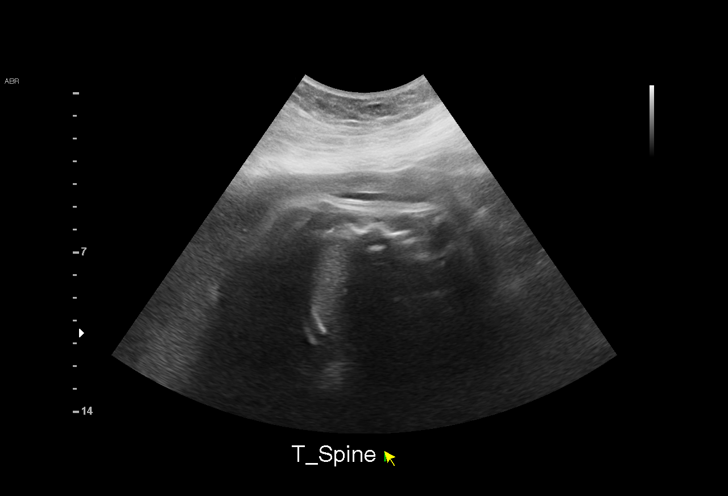
[im 19/39]
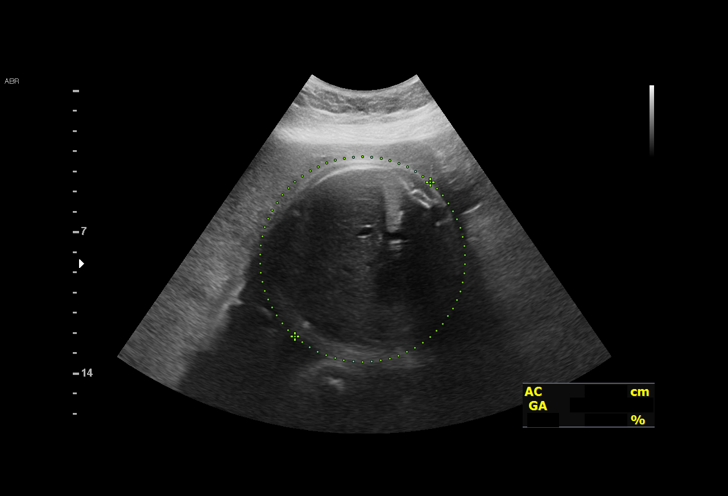
[im 22/39]
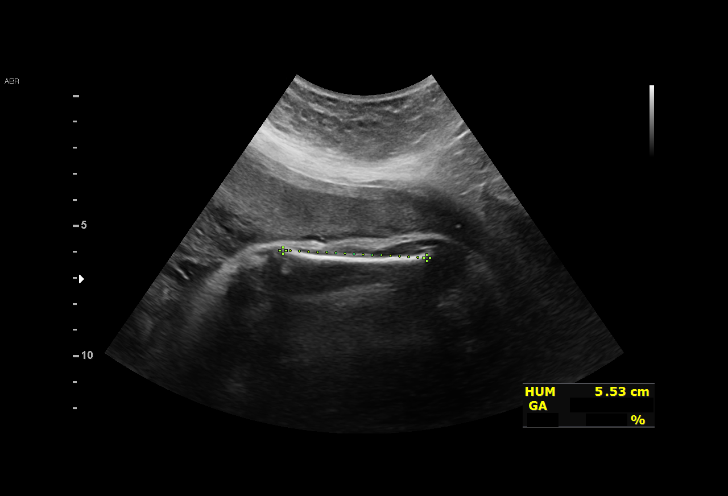
[im 24/39]
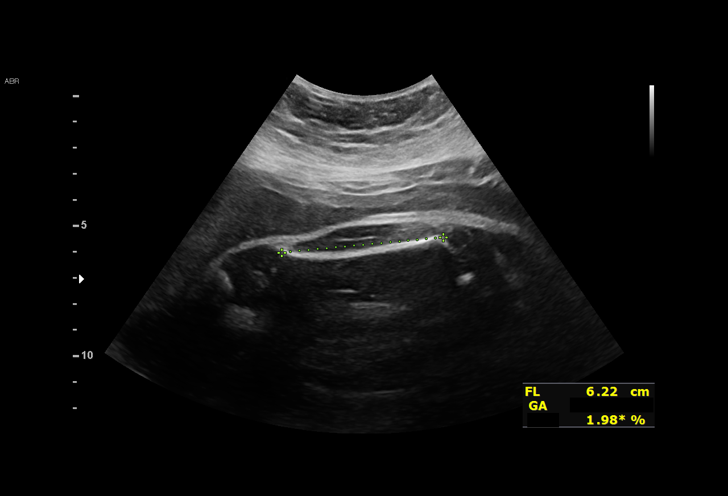
[im 27/39]
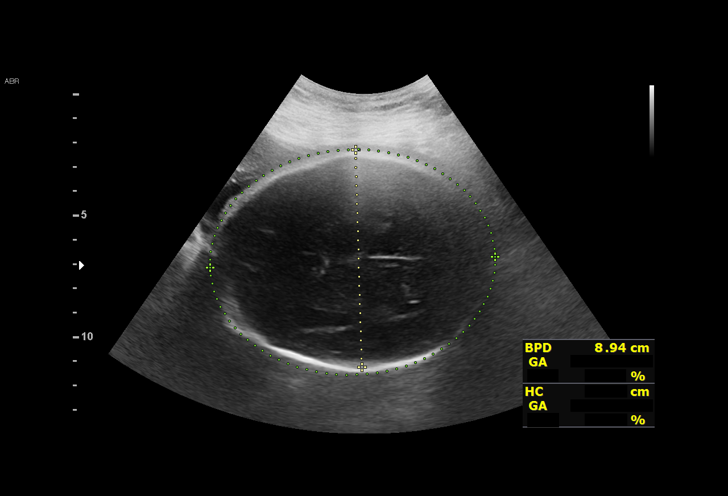
[im 30/39]
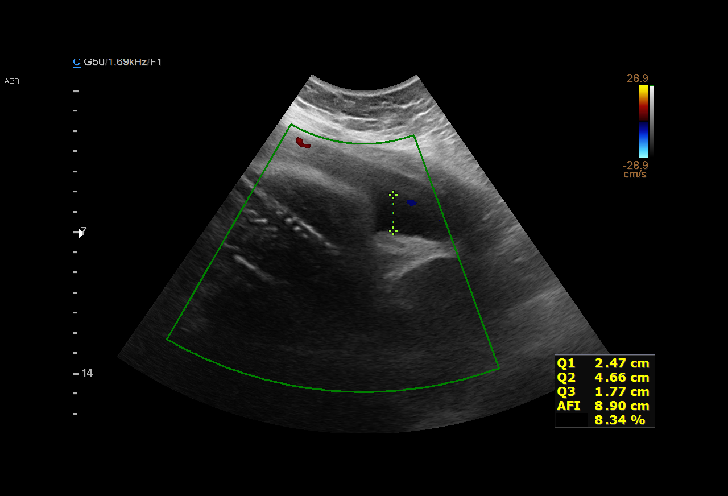
[im 33/39]
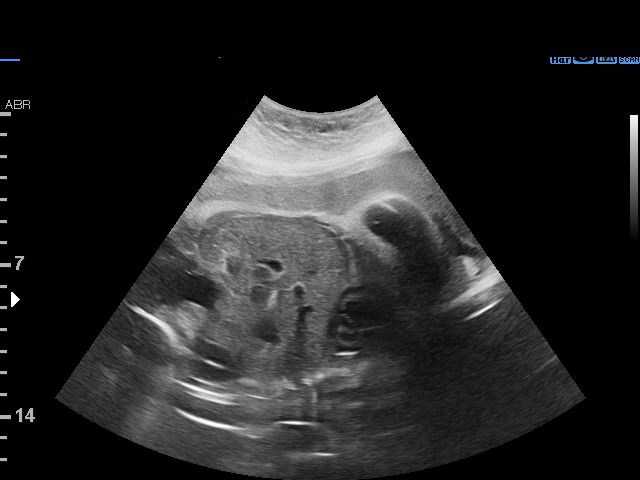
[im 36/39]
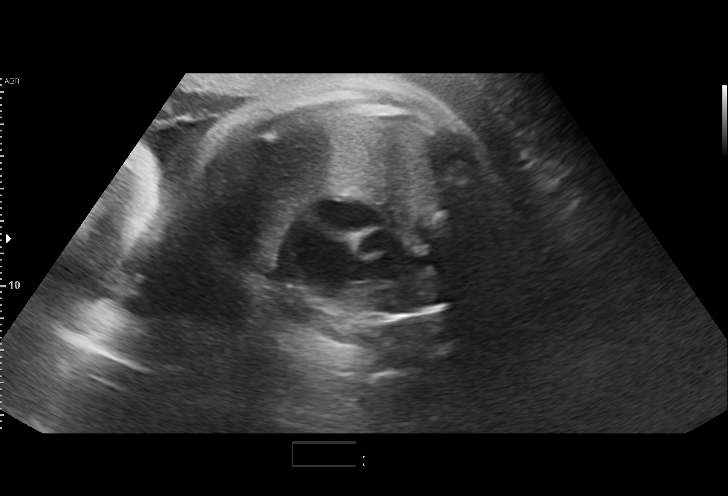
[im 39/39]
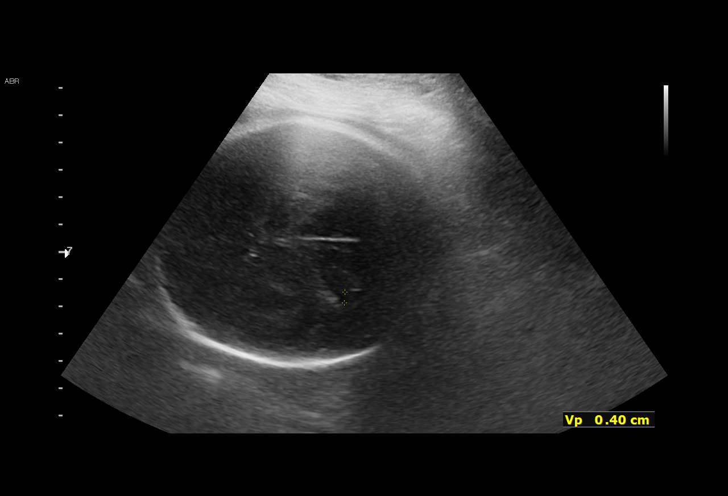

[14 of 28 positions shown; findings below may reference images not displayed]

ADBEL

Indications

 34 weeks gestation of pregnancy
 Advanced maternal age multigravida 35+,        [WW]
 third trimester
 Hypertension - Chronic/Pre-existing            [WW]
 Obesity complicating pregnancy, third          [WW]
 trimester
Fetal Evaluation

 Num Of Fetuses:         1
 Fetal Heart Rate(bpm):  148
 Cardiac Activity:       Observed
 Presentation:           Cephalic
 Placenta:               Anterior

 AFI Sum(cm)     %Tile       Largest Pocket(cm)
 10.8            26

 RUQ(cm)       RLQ(cm)       LUQ(cm)        LLQ(cm)

Biophysical Evaluation

 Amniotic F.V:   Within normal limits       F. Tone:        Observed
 F. Movement:    Observed                   Score:          [DATE]
 F. Breathing:   Observed
Biometry

 BPD:      88.9  mm     G. Age:  36w 0d         81  %    CI:        72.68   %    70 - 86
                                                         FL/HC:      18.9   %    20.1 -
 HC:      331.6  mm     G. Age:  37w 6d         85  %    HC/AC:      1.05        0.93 -
 AC:       316   mm     G. Age:  35w 4d         75  %    FL/BPD:     70.4   %    71 - 87
 FL:       62.6  mm     G. Age:  32w 3d        2.6  %    FL/AC:      19.8   %    20 - 24
 HUM:      54.9  mm     G. Age:  32w 0d          5  %

 LV:          4  mm

 Est. FW:    [WW]  gm    5 lb 10 oz      50  %
Gestational Age

 LMP:           34w 6d        Date:  [DATE]                 EDD:   [DATE]
 U/S Today:     35w 3d                                        EDD:   [DATE]
 Best:          34w 6d     Det. By:  LMP  ([DATE])          EDD:   [DATE]
Anatomy

 Cranium:               Previously seen        Aortic Arch:            Previously seen
 Cavum:                 Previously seen        Ductal Arch:            Previously seen
 Ventricles:            Appears normal         Diaphragm:              Previously seen
 Choroid Plexus:        Previously seen        Stomach:                Appears normal, left
                                                                       sided
 Cerebellum:            Previously seen        Abdomen:                Appears normal
 Posterior Fossa:       Previously seen        Abdominal Wall:         Previously seen
 Nuchal Fold:           Previously seen        Cord Vessels:           Previously seen
 Face:                  Orbits and profile     Kidneys:                Previously seen
                        previously seen
 Lips:                  Previously seen        Bladder:                Appears normal
 Palate:                Previously seen        Spine:                  Appears normal
 Heart:                 Appears normal         Upper Extremities:      Previously seen
                        (4CH, axis, and
                        situs)
 RVOT:                  Appears normal         Lower Extremities:      Previously seen
 LVOT:                  Previously seen
Impression

 Follow up growth due to chronic hypertension on labetalol
 Normal interval growth with measurements consistent with
 dates
 Good fetal movement and amniotic fluid volume
 Biophysical profile [DATE]

 Blood pressure is 131/78 mmHg.
Recommendations

 Continue weekly testing.
 Consider delivery at 37-39 weeks pending maternal and fetal
 status.

## 2021-02-22 ENCOUNTER — Other Ambulatory Visit: Payer: Self-pay | Admitting: Maternal & Fetal Medicine

## 2021-02-22 DIAGNOSIS — O09523 Supervision of elderly multigravida, third trimester: Secondary | ICD-10-CM

## 2021-02-22 DIAGNOSIS — O99213 Obesity complicating pregnancy, third trimester: Secondary | ICD-10-CM

## 2021-02-24 ENCOUNTER — Ambulatory Visit: Payer: BC Managed Care – PPO | Attending: Obstetrics

## 2021-02-24 ENCOUNTER — Other Ambulatory Visit: Payer: Self-pay

## 2021-02-24 ENCOUNTER — Ambulatory Visit (INDEPENDENT_AMBULATORY_CARE_PROVIDER_SITE_OTHER): Payer: BC Managed Care – PPO | Admitting: Obstetrics and Gynecology

## 2021-02-24 ENCOUNTER — Other Ambulatory Visit: Payer: Self-pay | Admitting: Obstetrics and Gynecology

## 2021-02-24 VITALS — BP 122/78 | Wt 247.0 lb

## 2021-02-24 DIAGNOSIS — Z3685 Encounter for antenatal screening for Streptococcus B: Secondary | ICD-10-CM | POA: Diagnosis not present

## 2021-02-24 DIAGNOSIS — O09523 Supervision of elderly multigravida, third trimester: Secondary | ICD-10-CM | POA: Diagnosis not present

## 2021-02-24 DIAGNOSIS — Z369 Encounter for antenatal screening, unspecified: Secondary | ICD-10-CM | POA: Diagnosis not present

## 2021-02-24 DIAGNOSIS — O10013 Pre-existing essential hypertension complicating pregnancy, third trimester: Secondary | ICD-10-CM | POA: Insufficient documentation

## 2021-02-24 DIAGNOSIS — E669 Obesity, unspecified: Secondary | ICD-10-CM | POA: Diagnosis not present

## 2021-02-24 DIAGNOSIS — Z7901 Long term (current) use of anticoagulants: Secondary | ICD-10-CM | POA: Insufficient documentation

## 2021-02-24 DIAGNOSIS — O99213 Obesity complicating pregnancy, third trimester: Secondary | ICD-10-CM

## 2021-02-24 DIAGNOSIS — O10019 Pre-existing essential hypertension complicating pregnancy, unspecified trimester: Secondary | ICD-10-CM

## 2021-02-24 DIAGNOSIS — O0990 Supervision of high risk pregnancy, unspecified, unspecified trimester: Secondary | ICD-10-CM | POA: Diagnosis not present

## 2021-02-24 DIAGNOSIS — Z3A36 36 weeks gestation of pregnancy: Secondary | ICD-10-CM | POA: Diagnosis not present

## 2021-02-24 DIAGNOSIS — R110 Nausea: Secondary | ICD-10-CM

## 2021-02-24 DIAGNOSIS — Z3A35 35 weeks gestation of pregnancy: Secondary | ICD-10-CM

## 2021-02-24 DIAGNOSIS — O09529 Supervision of elderly multigravida, unspecified trimester: Secondary | ICD-10-CM

## 2021-02-24 LAB — POCT URINALYSIS DIPSTICK OB: Glucose, UA: NEGATIVE

## 2021-02-24 IMAGING — US US MFM FETAL BPP W/O NON-STRESS
1 series · 15 of 23 positions shown · non-contrast
Comparison: none

[Series 1: us mfm fetal bpp w/o non-stress · 23 acquisitions, 15 frames shown]
[im 1/23]
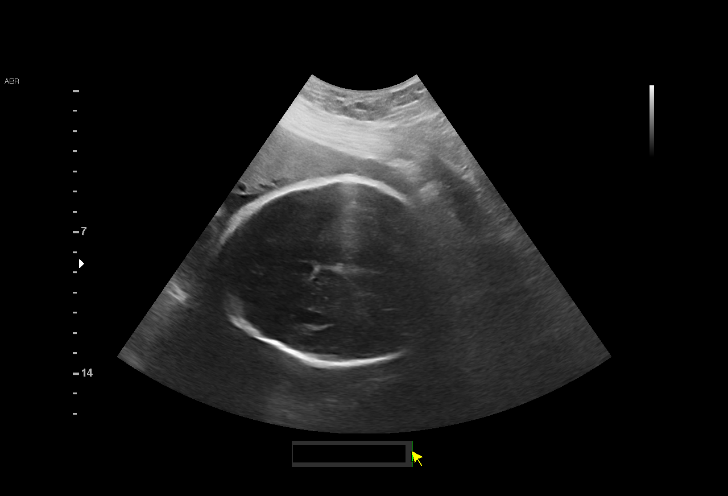
[im 3/23]
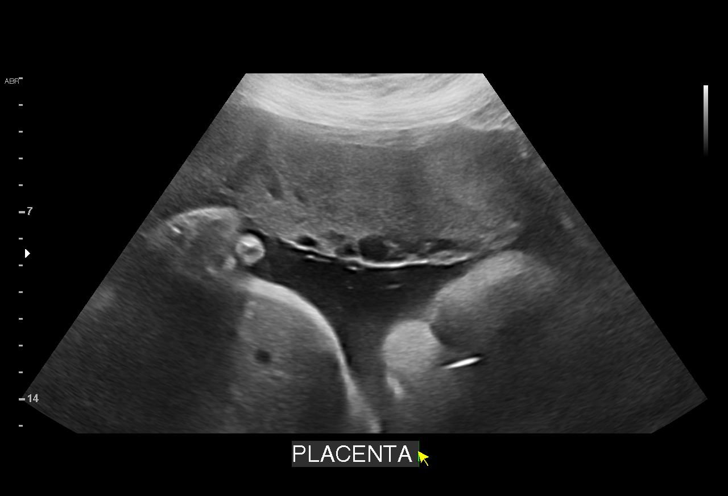
[im 4/23]
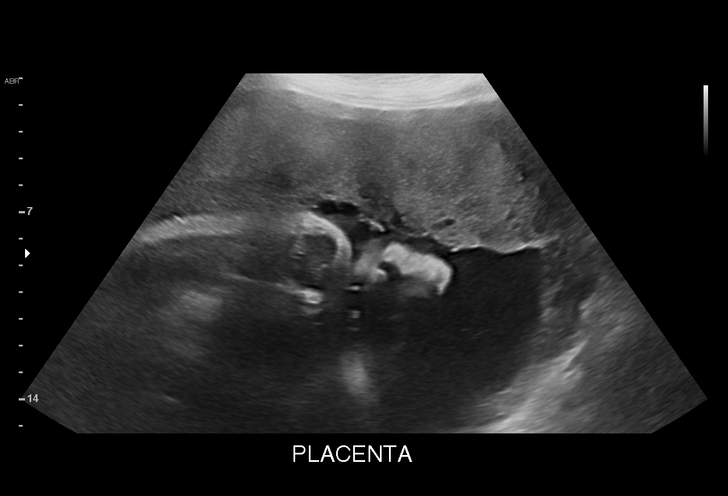
[im 6/23]
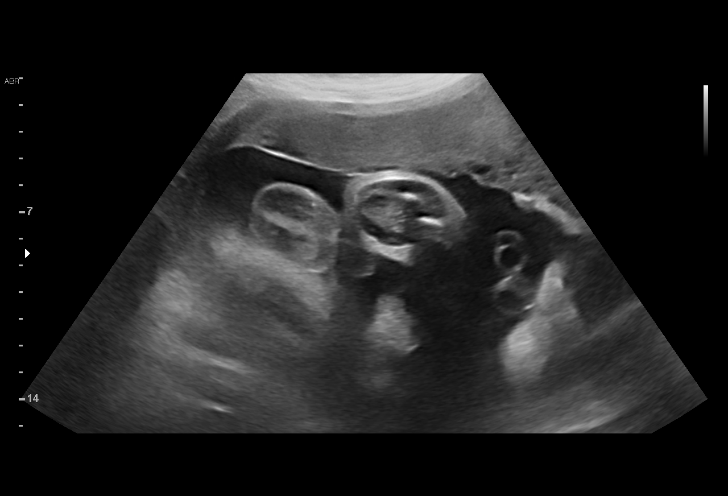
[im 7/23]
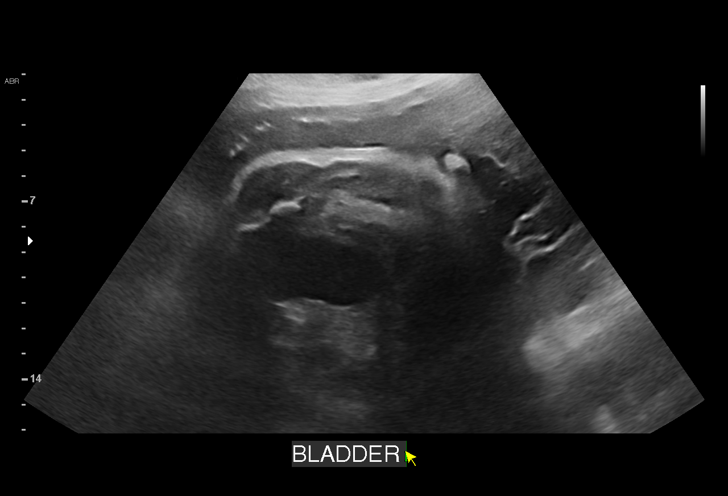
[im 9/23]
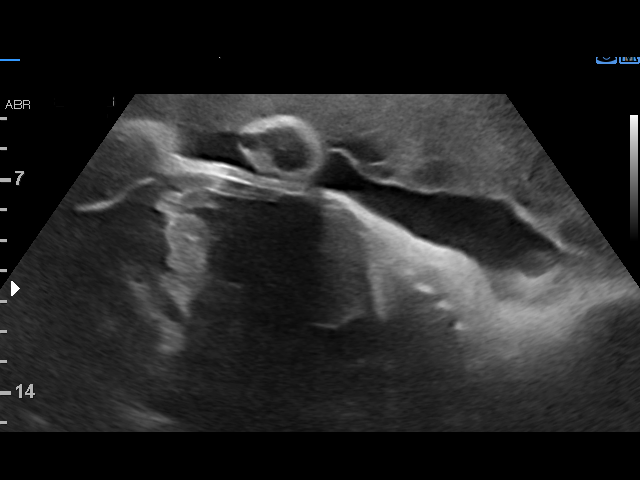
[im 10/23]
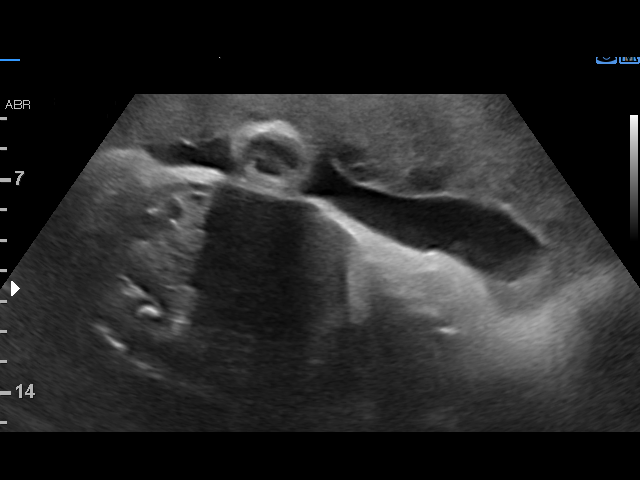
[im 12/23]
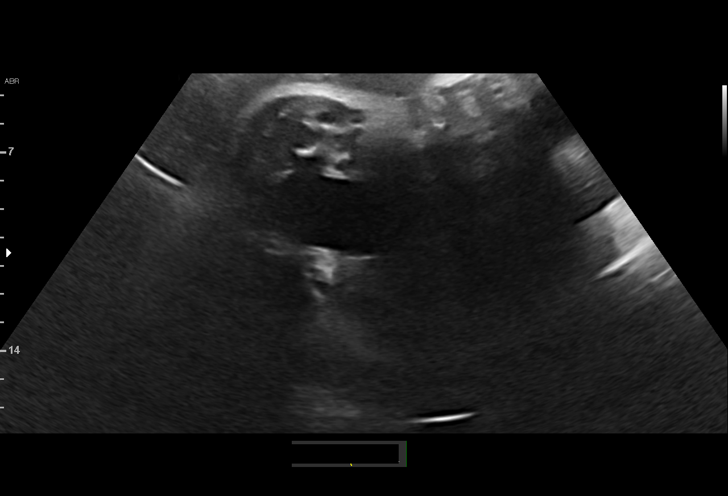
[im 14/23]
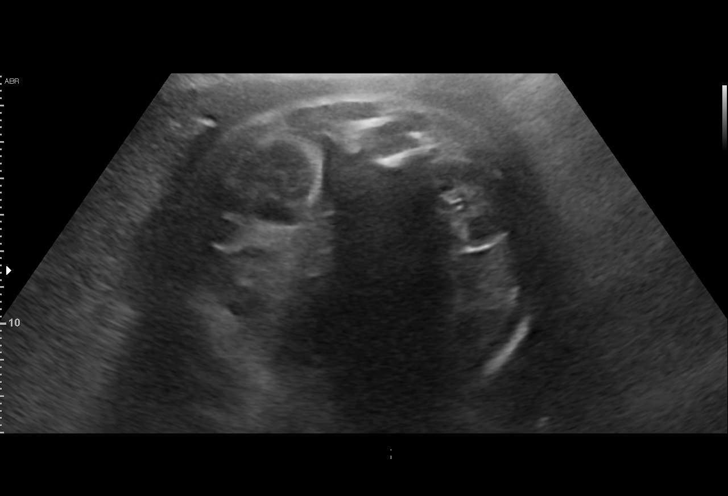
[im 15/23]
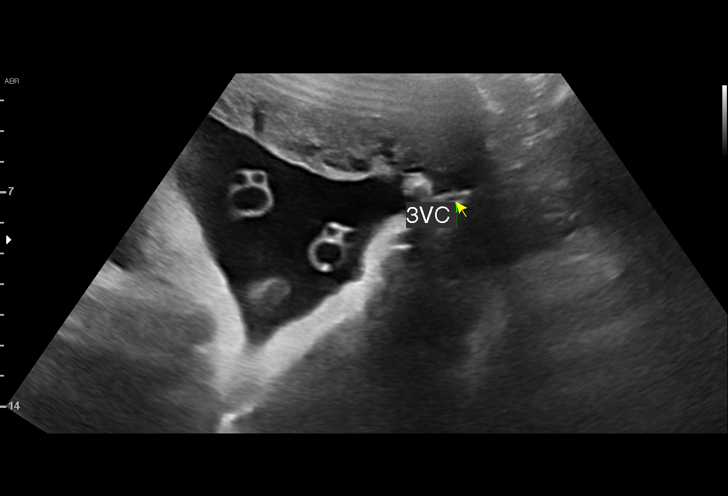
[im 17/23]
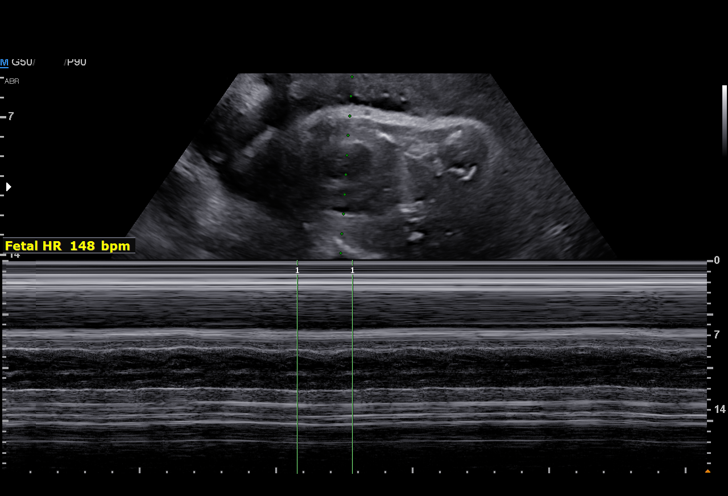
[im 18/23]
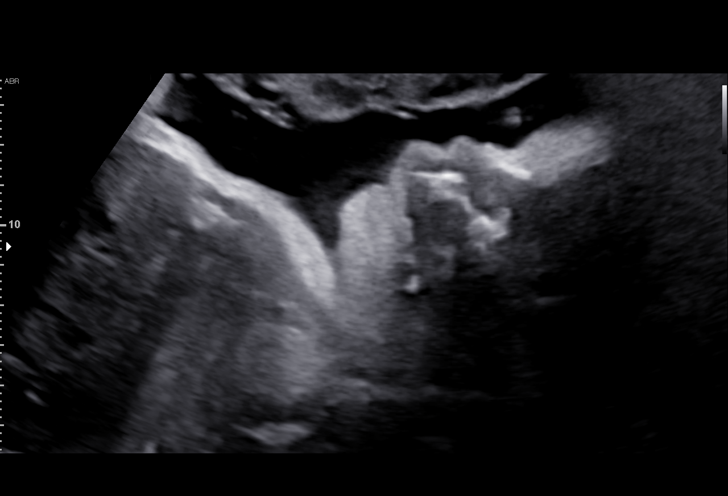
[im 20/23]
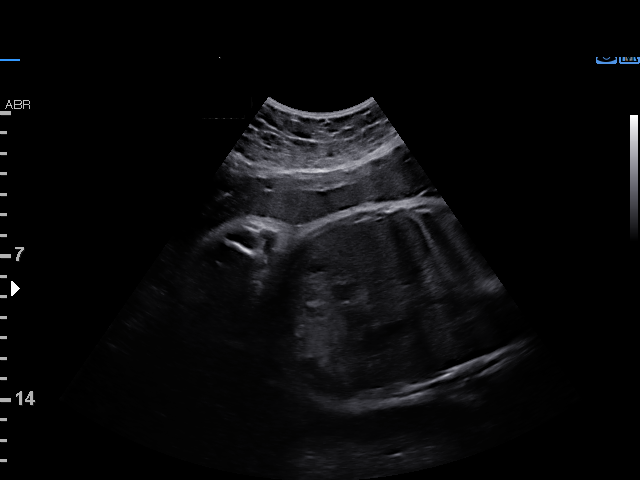
[im 21/23]
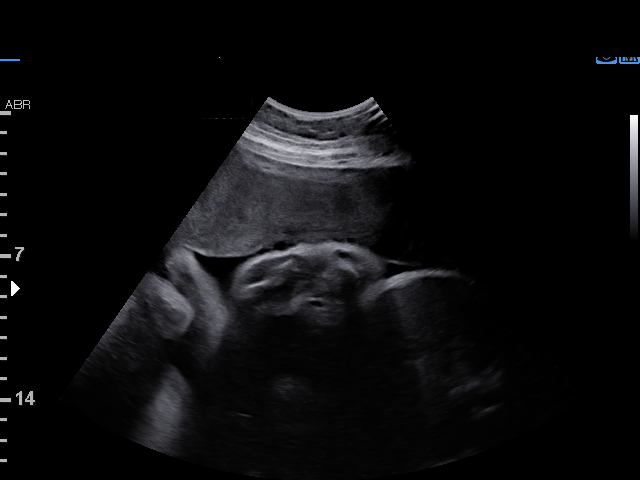
[im 23/23]
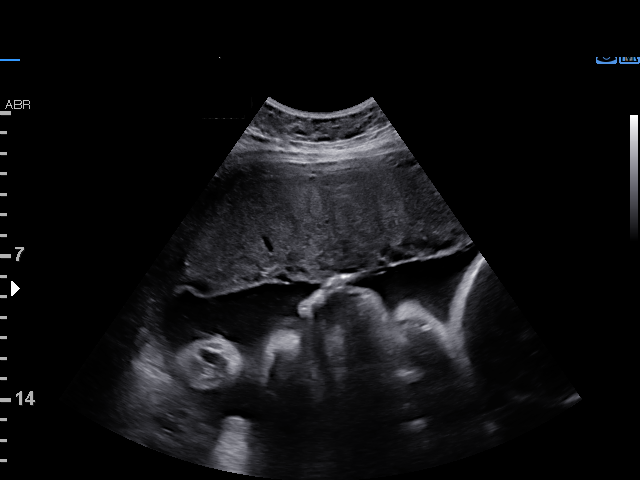

[15 of 23 positions shown; findings below may reference images not displayed]

Indications

 Advanced maternal age multigravida 35+,        [HJ]
 third trimester
 Hypertension - Chronic/Pre-existing            [HJ]
 Obesity complicating pregnancy, third          [HJ]
 trimester
 35 weeks gestation of pregnancy
Fetal Evaluation

 Num Of Fetuses:         1
 Fetal Heart Rate(bpm):  148
 Cardiac Activity:       Observed
 Presentation:           Cephalic
 Placenta:               Anterior

 Amniotic Fluid
 AFI FV:      Within normal limits

 AFI Sum(cm)     %Tile       Largest Pocket(cm)
 14.2            52

 RUQ(cm)       RLQ(cm)       LUQ(cm)        LLQ(cm)

Biophysical Evaluation

 Amniotic F.V:   Within normal limits       F. Tone:        Observed
 F. Movement:    Observed                   Score:          [DATE]
 F. Breathing:   Observed
Gestational Age

 LMP:           35w 6d        Date:  [DATE]                 EDD:   [DATE]
 Best:          35w 6d     Det. By:  LMP  ([DATE])          EDD:   [DATE]
Comments

 This patient was seen for a biophysical profile due to chronic
 hypertension treated with labetalol.  She denies any problems
 since her last exam.
 A biophysical profile performed today was [DATE].
 There was normal amniotic fluid noted on today's ultrasound
 exam.
 Due to chronic hypertension, she should continue weekly
 NSTs in your office until delivery, which has already been
 scheduled for [DATE].
 No further exams were scheduled in our office.

## 2021-02-24 MED ORDER — PROMETHAZINE HCL 25 MG PO TABS: 25.0000 mg | ORAL_TABLET | Freq: Two times a day (BID) | ORAL | 2 refills | Status: DC | PRN

## 2021-02-24 NOTE — Progress Notes (Signed)
ROB - NST, bilat swelling ankles. Procedure RM

## 2021-02-24 NOTE — Progress Notes (Signed)
Routine Prenatal Care Visit  Subjective  Brenda [name] is a 40 y.o. G3P1011 at [redacted]w[redacted]d being seen today for ongoing prenatal care.  She is currently monitored for the following issues for this high-risk pregnancy and has Adult ADHD; Blood in stool; Rectal polyp; Heartburn; Nausea; Epigastric abdominal pain; Abdominal pain; Gastroesophageal reflux disease; Essential hypertension; Performance anxiety; Supervision of high risk pregnancy, antepartum; Antepartum multigravida of advanced maternal age; Maternal obesity, antepartum; and Pre-existing essential hypertension during pregnancy, antepartum on their problem list.  ----------------------------------------------------------------------------------- Patient reports no complaints.   Contractions: Irregular. Vag. Bleeding: None.  Movement: Present. Denies leaking of fluid.  ----------------------------------------------------------------------------------- The following portions of the patient's history were reviewed and updated as appropriate: allergies, current medications, past family history, past medical history, past social history, past surgical history and problem list. Problem list updated.   Objective  Blood pressure 122/78, weight 247 lb (112 kg), last menstrual period 06/18/2020. Pregravid weight 190 lb (86.2 kg) Total Weight Gain 57 lb (25.9 kg) Urinalysis:      Fetal Status: Fetal Heart Rate (bpm): 145   Movement: Present  Presentation: Vertex  General:  Alert, oriented and cooperative. Patient is in no acute distress.  Skin: Skin is warm and dry. No rash noted.   Cardiovascular: Normal heart rate noted  Respiratory: Normal respiratory effort, no problems with respiration noted  Abdomen: Soft, gravid, appropriate for gestational age. Pain/Pressure: Absent     Pelvic:  Cervical exam performed Dilation: 1.5 Effacement (%): 50 Station: -3  Extremities: Normal range of motion.     ental Status: Normal mood and affect.  Normal behavior. Normal judgment and thought content.   Baseline: 145 Variability: moderate Accelerations: present Decelerations: absent Tocometry: NA The patient was monitored for 30 minutes, fetal heart rate tracing was deemed reactive, category I tracing,  CPT 59025   [place] MFM FETAL BPP WO NON STRESS  Result Date: 02/17/2021 ----------------------------------------------------------------------  OBSTETRICS REPORT                       (Signed Final 02/17/2021 04:18 pm) ---------------------------------------------------------------------- Patient Info  ID #:       [id number]                          D.O.B.:  22-Mar-1981 (40 yrs)  Name:       Brenda [name]            Visit Date: 02/17/2021 07:59 am ---------------------------------------------------------------------- Performed By  Attending:        Lin Landsman      Ref. Address:     [address]                    [place]                                                             [place], [place],                                                             [place] [address]  Performed By:     Francene Boyers           [facility]                    RDMS                                     [facility] at                                                             [facility]  Referred By:      Vena Austria MD ---------------------------------------------------------------------- Orders  #  Description                           Code        Ordered By  1  [place] MFM OB FOLLOW UP                   53614.43    Lin Landsman  2  [place] MFM FETAL BPP WO NON               76819.01    [facility]     STRESS                                            BOOKER ----------------------------------------------------------------------  #  Order #                     Accession #                Episode #  1  [id number]                   [id number]                 [id number]  2   [id number]                   [id number]                 [id number] ---------------------------------------------------------------------- Indications  [redacted] weeks gestation of pregnancy                Z3A.61  Advanced maternal age multigravida 81+,        O65.523  third trimester  Hypertension - Chronic/Pre-existing            O10.019  Obesity complicating pregnancy, third          O99.213  trimester ---------------------------------------------------------------------- Fetal Evaluation  Num Of Fetuses:  1  Fetal Heart Rate(bpm):  148  Cardiac Activity:       Observed  Presentation:           Cephalic  Placenta:               Anterior  AFI Sum(cm)     %Tile       Largest Pocket(cm)  10.8            26          4.7  RUQ(cm)       RLQ(cm)       LUQ(cm)        LLQ(cm)  2.5           1.8           4.7            1.8 ---------------------------------------------------------------------- Biophysical Evaluation  Amniotic F.V:   Within normal limits       F. Tone:        Observed  F. Movement:    Observed                   Score:          8/8  F. Breathing:   Observed ---------------------------------------------------------------------- Biometry  BPD:      88.9  mm     G. Age:  36w 0d         81  %    CI:        72.68   %    70 - 86                                                          FL/HC:      18.9   %    20.1 - 22.3  HC:      331.6  mm     G. Age:  37w 6d         85  %    HC/AC:      1.05        0.93 - 1.11  AC:       316   mm     G. Age:  35w 4d         75  %    FL/BPD:     70.4   %    71 - 87  FL:       62.6  mm     G. Age:  32w 3d        2.6  %    FL/AC:      19.8   %    20 - 24  HUM:      54.9  mm     G. Age:  32w 0d          5  %  LV:          4  mm  Est. FW:    2563  gm    5 lb 10 oz      50  % ---------------------------------------------------------------------- Gestational Age  LMP:           34w 6d        Date:  06/18/20  EDD:   03/25/21  U/S Today:     35w 3d                                         EDD:   03/21/21  Best:          34w 6d     Det. By:  LMP  (06/18/20)          EDD:   03/25/21 ---------------------------------------------------------------------- Anatomy  Cranium:               Previously seen        Aortic Arch:            Previously seen  Cavum:                 Previously seen        Ductal Arch:            Previously seen  Ventricles:            Appears normal         Diaphragm:              Previously seen  Choroid Plexus:        Previously seen        Stomach:                Appears normal, left                                                                        sided  Cerebellum:            Previously seen        Abdomen:                Appears normal  Posterior Fossa:       Previously seen        Abdominal Wall:         Previously seen  Nuchal Fold:           Previously seen        Cord Vessels:           Previously seen  Face:                  Orbits and profile     Kidneys:                Previously seen                         previously seen  Lips:                  Previously seen        Bladder:                Appears normal  Palate:                Previously seen        Spine:                  Appears normal  Heart:  Appears normal         Upper Extremities:      Previously seen                         (4CH, axis, and                         situs)  RVOT:                  Appears normal         Lower Extremities:      Previously seen  LVOT:                  Previously seen ---------------------------------------------------------------------- Impression  Follow up growth due to chronic hypertension on labetalol  Normal interval growth with measurements consistent with  dates  Good fetal movement and amniotic fluid volume  Biophysical profile 8/8  Blood pressure is 131/78 mmHg. ---------------------------------------------------------------------- Recommendations  Continue weekly testing.  Consider delivery at 37-39 weeks pending maternal and fetal   status. ----------------------------------------------------------------------               Lin Landsman, MD Electronically Signed Final Report   02/17/2021 04:18 pm ----------------------------------------------------------------------  [place] MFM FETAL BPP WO NON STRESS  Result Date: 02/10/2021 ----------------------------------------------------------------------  OBSTETRICS REPORT                       (Signed Final 02/10/2021 09:01 am) ---------------------------------------------------------------------- Patient Info  ID #:       [id number]                          D.O.B.:  01-28-1981 (40 yrs)  Name:       Brenda [name]            Visit Date: 02/10/2021 07:47 am ---------------------------------------------------------------------- Performed By  Attending:        Lin Landsman      Ref. Address:     [address]                    [place]                                                             [place], [place],                                                             [place] [address]  Performed By:     Lorn Junes,           Location:         [facility]                    RDMS, RDCS                               Fetal Care at  [facility]  Referred By:      Vena Austria MD ---------------------------------------------------------------------- Orders  #  Description                           Code        Ordered By  1  [place] MFM FETAL BPP WO NON               76819.01    [facility]     STRESS ----------------------------------------------------------------------  #  Order #                     Accession #                Episode #  1  [id number]                   [id number]                 [id number] ---------------------------------------------------------------------- Indications  Pre-existing essential hypertension            O10.013  complicating pregnancy, third trimester  Obesity complicating pregnancy,  third          O99.213  trimester  Advanced maternal age multigravida 67+,        O48.523  third trimester  [redacted] weeks gestation of pregnancy                Z3A.33 ---------------------------------------------------------------------- Fetal Evaluation  Num Of Fetuses:         1  Fetal Heart Rate(bpm):  138  Cardiac Activity:       Observed  Presentation:           Cephalic  Placenta:               Anterior  Amniotic Fluid  AFI FV:      Within normal limits  AFI Sum(cm)     %Tile       Largest Pocket(cm)  12.9            40          4.4  RUQ(cm)       RLQ(cm)       LUQ(cm)        LLQ(cm)  2.2           2.4           4.4            3.9 ---------------------------------------------------------------------- Biophysical Evaluation  Amniotic F.V:   Within normal limits       F. Tone:        Observed  F. Movement:    Observed                   Score:          8/8  F. Breathing:   Observed ---------------------------------------------------------------------- Gestational Age  LMP:           33w 6d        Date:  06/18/20                 EDD:   03/25/21  Best:          33w 6d     Det. By:  LMP  (06/18/20)          EDD:  03/25/21 ---------------------------------------------------------------------- Anatomy  Cranium:               Appears normal         Ductal Arch:            Previously seen  Cavum:                 Appears normal         Diaphragm:              Appears normal  Ventricles:            Appears normal         Stomach:                Appears normal, left                                                                        sided  Choroid Plexus:        Previously seen        Abdomen:                Appears normal  Cerebellum:            Previously seen        Abdominal Wall:         Previously seen  Posterior Fossa:       Previously seen        Cord Vessels:           Appears normal (3                                                                        vessel cord)  Face:                  Orbits and profile      Kidneys:                Appear normal                         previously seen  Lips:                  Previously seen        Bladder:                Appears normal  Heart:                 Previously seen        Spine:                  Previously seen  RVOT:                  Previously seen        Upper Extremities:      Previously seen  LVOT:                  Previously  seen        Lower Extremities:      Previously seen  Aortic Arch:           Previously seen ---------------------------------------------------------------------- Impression  Antenatal testing performed given maternal chronic  hypertension on medication.  The biophysical profile was 8/8 with good fetal movement and  amniotic fluid volume.  Blood pressure 116/91 mmHg. ---------------------------------------------------------------------- Recommendations  Continue weekly testing.  Follow up growth in 2 weeks. ----------------------------------------------------------------------               Lin Landsman, MD Electronically Signed Final Report   02/10/2021 09:01 am ----------------------------------------------------------------------  [place] MFM OB FOLLOW UP  Result Date: 02/17/2021 ----------------------------------------------------------------------  OBSTETRICS REPORT                       (Signed Final 02/17/2021 04:18 pm) ---------------------------------------------------------------------- Patient Info  ID #:       [id number]                          D.O.B.:  03/29/1981 (40 yrs)  Name:       MIAKODA TATE [name]            Visit Date: 02/17/2021 07:59 am ---------------------------------------------------------------------- Performed By  Attending:        Lin Landsman      Ref. Address:     [address]                    [place]                                                             [place], [place],                                                             [place] [address]  Performed By:     Francene Boyers           [facility]                    RDMS                                     [facility] at                                                             [facility]  Referred By:      Vena Austria MD ---------------------------------------------------------------------- Orders  #  Description  Code        Ordered By  1  [place] MFM OB FOLLOW UP                   [id number]    Lin Landsman  2  [place] MFM FETAL BPP WO NON               76819.01    [facility]     STRESS                                            BOOKER ----------------------------------------------------------------------  #  Order #                     Accession #                Episode #  1  [id number]                   [id number]                 [id number]  2  [id number]                   [id number]                 [id number] ---------------------------------------------------------------------- Indications  [redacted] weeks gestation of pregnancy                Z3A.27  Advanced maternal age multigravida 71+,        O5.523  third trimester  Hypertension - Chronic/Pre-existing            O10.019  Obesity complicating pregnancy, third          O99.213  trimester ---------------------------------------------------------------------- Fetal Evaluation  Num Of Fetuses:         1  Fetal Heart Rate(bpm):  148  Cardiac Activity:       Observed  Presentation:           Cephalic  Placenta:               Anterior  AFI Sum(cm)     %Tile       Largest Pocket(cm)  10.8            26          4.7  RUQ(cm)       RLQ(cm)       LUQ(cm)        LLQ(cm)  2.5           1.8           4.7            1.8 ---------------------------------------------------------------------- Biophysical Evaluation  Amniotic F.V:   Within normal limits       F. Tone:        Observed  F. Movement:    Observed                   Score:          8/8  F. Breathing:  Observed  ---------------------------------------------------------------------- Biometry  BPD:      88.9  mm     G. Age:  36w 0d         81  %    CI:        72.68   %    70 - 86                                                          FL/HC:      18.9   %    20.1 - 22.3  HC:      331.6  mm     G. Age:  37w 6d         85  %    HC/AC:      1.05        0.93 - 1.11  AC:       316   mm     G. Age:  35w 4d         75  %    FL/BPD:     70.4   %    71 - 87  FL:       62.6  mm     G. Age:  32w 3d        2.6  %    FL/AC:      19.8   %    20 - 24  HUM:      54.9  mm     G. Age:  32w 0d          5  %  LV:          4  mm  Est. FW:    2563  gm    5 lb 10 oz      50  % ---------------------------------------------------------------------- Gestational Age  LMP:           34w 6d        Date:  06/18/20                 EDD:   03/25/21  U/S Today:     35w 3d                                        EDD:   03/21/21  Best:          34w 6d     Det. By:  LMP  (06/18/20)          EDD:   03/25/21 ---------------------------------------------------------------------- Anatomy  Cranium:               Previously seen        Aortic Arch:            Previously seen  Cavum:                 Previously seen        Ductal Arch:            Previously seen  Ventricles:            Appears normal         Diaphragm:  Previously seen  Choroid Plexus:        Previously seen        Stomach:                Appears normal, left                                                                        sided  Cerebellum:            Previously seen        Abdomen:                Appears normal  Posterior Fossa:       Previously seen        Abdominal Wall:         Previously seen  Nuchal Fold:           Previously seen        Cord Vessels:           Previously seen  Face:                  Orbits and profile     Kidneys:                Previously seen                         previously seen  Lips:                  Previously seen        Bladder:                Appears  normal  Palate:                Previously seen        Spine:                  Appears normal  Heart:                 Appears normal         Upper Extremities:      Previously seen                         (4CH, axis, and                         situs)  RVOT:                  Appears normal         Lower Extremities:      Previously seen  LVOT:                  Previously seen ---------------------------------------------------------------------- Impression  Follow up growth due to chronic hypertension on labetalol  Normal interval growth with measurements consistent with  dates  Good fetal movement and amniotic fluid volume  Biophysical profile 8/8  Blood pressure is 131/78 mmHg. ---------------------------------------------------------------------- Recommendations  Continue weekly testing.  Consider delivery at 37-39 weeks pending maternal and fetal  status. ----------------------------------------------------------------------  Lin Landsman, MD Electronically Signed Final Report   02/17/2021 04:18 pm ----------------------------------------------------------------------   Assessment   40 y.o. G3P1011 at [redacted]w[redacted]d by  03/25/2021, by Last Menstrual Period presenting for routine prenatal visit  Plan   pregnancy 3 Problems (from 08/05/20 to present)     Problem Noted Resolved   Supervision of high risk pregnancy, antepartum 08/05/2020 by Vena Austria, MD No   Overview Addendum 01/13/2021  1:12 PM by Vena Austria, MD    [facility] Prenatal Labs  Dating LMP = 9 week [place] Blood type: O/Positive/-- (02/10 1202)   Genetic Screen NIPS: Normal XY Antibody:Negative (02/10 1202)  Anatomic [place] Normal 5/24 MFM Rubella: 1.64 (02/10 1202) Varicella: Immune  GTT Third trimester: 105 RPR: Non Reactive (02/10 1202)   Rhogam N/A HBsAg: Negative (02/10 1202)   TDaP vaccine                       Flu Shot: HIV: Non Reactive (02/10 1202)   Baby Food                                GBS:    Contraception  Pap: 08/05/2020 ASCUS HPV negative  CBB     CS/VBAC N/A   Support Person Husband Quint            Antepartum multigravida of advanced maternal age 30/03/2021 by Vena Austria, MD No   Maternal obesity, antepartum 08/05/2020 by Vena Austria, MD No   Pre-existing essential hypertension during pregnancy, antepartum 08/05/2020 by Vena Austria, MD No        Gestational age appropriate obstetric precautions including but not limited to vaginal bleeding, contractions, leaking of fluid and fetal movement were reviewed in detail with the patient.   1) CHTN - remains well controlled.  BPP 02/17/2021 8/8, NST today reactive - schedule IOL for [redacted]w[redacted]d at 0800 on 03/15/2021 (orders placed)  2) GBS collected today   Return in about 1 week (around 03/03/2021) for ROB/NST.  Vena Austria, MD, [facility], [facility] 02/24/2021, 12:48 PM

## 2021-02-26 LAB — STREP GP B NAA: Strep Gp B NAA: NEGATIVE

## 2021-03-02 ENCOUNTER — Other Ambulatory Visit: Payer: Self-pay

## 2021-03-02 ENCOUNTER — Ambulatory Visit (INDEPENDENT_AMBULATORY_CARE_PROVIDER_SITE_OTHER): Payer: BC Managed Care – PPO | Admitting: Obstetrics and Gynecology

## 2021-03-02 VITALS — BP 126/84 | Wt 255.0 lb

## 2021-03-02 DIAGNOSIS — Z3A36 36 weeks gestation of pregnancy: Secondary | ICD-10-CM

## 2021-03-02 DIAGNOSIS — O99210 Obesity complicating pregnancy, unspecified trimester: Secondary | ICD-10-CM

## 2021-03-02 DIAGNOSIS — O0990 Supervision of high risk pregnancy, unspecified, unspecified trimester: Secondary | ICD-10-CM | POA: Diagnosis not present

## 2021-03-02 DIAGNOSIS — O10019 Pre-existing essential hypertension complicating pregnancy, unspecified trimester: Secondary | ICD-10-CM

## 2021-03-02 DIAGNOSIS — O09529 Supervision of elderly multigravida, unspecified trimester: Secondary | ICD-10-CM

## 2021-03-02 LAB — POCT URINALYSIS DIPSTICK OB
Glucose, UA: NEGATIVE
POC,PROTEIN,UA: NEGATIVE

## 2021-03-02 NOTE — Progress Notes (Signed)
ROB, NST - bilat feet/leg swelling. Procedure RM

## 2021-03-02 NOTE — Progress Notes (Signed)
3   Routine Prenatal Care Visit  Subjective  Brenda [name] is a 40 y.o. G3P1011 at [redacted]w[redacted]d being seen today for ongoing prenatal care.  She is currently monitored for the following issues for this high-risk pregnancy and has Adult ADHD; Blood in stool; Rectal polyp; Heartburn; Nausea; Epigastric abdominal pain; Abdominal pain; Gastroesophageal reflux disease; Essential hypertension; Performance anxiety; Supervision of high risk pregnancy, antepartum; Antepartum multigravida of advanced maternal age; Maternal obesity, antepartum; and Pre-existing essential hypertension during pregnancy, antepartum on their problem list.  ----------------------------------------------------------------------------------- Patient reports no complaints.   Contractions: Irregular. Vag. Bleeding: None.  Movement: Present. Denies leaking of fluid.  ----------------------------------------------------------------------------------- The following portions of the patient's history were reviewed and updated as appropriate: allergies, current medications, past family history, past medical history, past social history, past surgical history and problem list. Problem list updated.   Objective  Blood pressure 126/84, weight 255 lb (115.7 kg), last menstrual period 06/18/2020.  Pregravid weight 190 lb (86.2 kg) Total Weight Gain 65 lb (29.5 kg) Urinalysis:      Fetal Status: Fetal Heart Rate (bpm): 135   Movement: Present  Presentation: Vertex  General:  Alert, oriented and cooperative. Patient is in no acute distress.  Skin: Skin is warm and dry. No rash noted.   Cardiovascular: Normal heart rate noted  Respiratory: Normal respiratory effort, no problems with respiration noted  Abdomen: Soft, gravid, appropriate for gestational age. Pain/Pressure: Absent     Pelvic:  Cervical exam deferred        Extremities: Normal range of motion.  Edema: Trace  ental Status: Normal mood and affect. Normal behavior. Normal  judgment and thought content.   Baseline: 135 Variability: moderate Accelerations: present Decelerations: absent Tocometry: n/a The patient was monitored for 30 minutes, fetal heart rate tracing was deemed reactive, category I tracing,  [place] MFM FETAL BPP WO NON STRESS  Result Date: 02/24/2021 ----------------------------------------------------------------------  OBSTETRICS REPORT                       (Signed Final 02/24/2021 03:30 pm) ---------------------------------------------------------------------- Patient Info  ID #:       [id number]                          D.O.B.:  March 09, 1981 (40 yrs)  Name:       Brenda [name]            Visit Date: 02/24/2021 02:31 pm ---------------------------------------------------------------------- Performed By  Attending:        Ma Rings MD         Ref. Address:     [address], [place],                                                             [place] [address]  Performed By:     Lorn Junes,  [facility]                    RDMS, RDCS                               Fetal Care at                                                             [facility]  Referred By:      Vena Austria MD ---------------------------------------------------------------------- Orders  #  Description                           Code        Ordered By  1  [place] MFM FETAL BPP WO NON               76819.01    CORENTHIAN     STRESS                                            BOOKER ----------------------------------------------------------------------  #  Order #                     Accession #                Episode #  1  [id number]                   [id number]                 [id number] ---------------------------------------------------------------------- Indications  Advanced maternal age multigravida 42+,        O75.523  third trimester  Hypertension -  Chronic/Pre-existing            O10.019  Obesity complicating pregnancy, third          O99.213  trimester  [redacted] weeks gestation of pregnancy                Z3A.35 ---------------------------------------------------------------------- Fetal Evaluation  Num Of Fetuses:         1  Fetal Heart Rate(bpm):  148  Cardiac Activity:       Observed  Presentation:           Cephalic  Placenta:               Anterior  Amniotic Fluid  AFI FV:      Within normal limits  AFI Sum(cm)     %Tile       Largest Pocket(cm)  14.2            52          4.7  RUQ(cm)       RLQ(cm)       LUQ(cm)        LLQ(cm)  3.9           1.8  4.7            3.8 ---------------------------------------------------------------------- Biophysical Evaluation  Amniotic F.V:   Within normal limits       F. Tone:        Observed  F. Movement:    Observed                   Score:          8/8  F. Breathing:   Observed ---------------------------------------------------------------------- Gestational Age  LMP:           35w 6d        Date:  06/18/20                 EDD:   03/25/21  Best:          Consuello Closs 6d     Det. By:  LMP  (06/18/20)          EDD:   03/25/21 ---------------------------------------------------------------------- Comments  This patient was seen for a biophysical profile due to chronic  hypertension treated with labetalol.  She denies any problems  since her last exam.  A biophysical profile performed today was 8 out of 8.  There was normal amniotic fluid noted on today's ultrasound  exam.  Due to chronic hypertension, she should continue weekly  NSTs in your office until delivery, which has already been  scheduled for March 15, 2021.  No further exams were scheduled in our office. ----------------------------------------------------------------------                   Ma Rings, MD Electronically Signed Final Report   02/24/2021 03:30 pm ----------------------------------------------------------------------  [place] MFM FETAL BPP WO NON  STRESS  Result Date: 02/17/2021 ----------------------------------------------------------------------  OBSTETRICS REPORT                       (Signed Final 02/17/2021 04:18 pm) ---------------------------------------------------------------------- Patient Info  ID #:       [id number]                          D.O.B.:  [birth year] (40 yrs)  Name:       Brenda [name]            Visit Date: 02/17/2021 07:59 am ---------------------------------------------------------------------- Performed By  Attending:        Lin Landsman      Ref. Address:     [address]                    MD                                                             [place], [place],                                                             [place] [address]  Performed By:     Francene Boyers           Location[facility]         [facility]  RDMS                                     Fetal Care at                                                             [facility]  Referred By:      Vena AustriaANDREAS                    Ezrael Sam MD ---------------------------------------------------------------------- Orders  #  Description                           Code        Ordered By  1  US MFM OB FOLLOW UP                   16109.6076816.01    Lin LandsmanORENTHIAN                                                       BOOKER  2  US MFM FETAL BPP WO NON               76819.01    CORENTHIAN     STRESS                                            BOOKER ----------------------------------------------------------------------  #  Order #                     Accession #                Episode #  1  [id number]                   [id number]                 [id number]  2  [id number]                   [id number]                 [id number] ---------------------------------------------------------------------- Indications  [redacted] weeks gestation of pregnancy                Z3A.7934  Advanced maternal age multigravida 5235+,        23O09.523  third trimester  Hypertension -  Chronic/Pre-existing            O10.019  Obesity complicating pregnancy, third          O99.213  trimester ---------------------------------------------------------------------- Fetal Evaluation  Num Of Fetuses:         1  Fetal Heart Rate(bpm):  148  Cardiac Activity:       Observed  Presentation:           Cephalic  Placenta:               Anterior  AFI Sum(cm)     %Tile       Largest Pocket(cm)  10.8            26          4.7  RUQ(cm)       RLQ(cm)       LUQ(cm)        LLQ(cm)  2.5           1.8           4.7            1.8 ---------------------------------------------------------------------- Biophysical Evaluation  Amniotic F.V:   Within normal limits       F. Tone:        Observed  F. Movement:    Observed                   Score:          8/8  F. Breathing:   Observed ---------------------------------------------------------------------- Biometry  BPD:      88.9  mm     G. Age:  36w 0d         81  %    CI:        72.68   %    70 - 86                                                          FL/HC:      18.9   %    20.1 - 22.3  HC:      331.6  mm     G. Age:  37w 6d         85  %    HC/AC:      1.05        0.93 - 1.11  AC:       316   mm     G. Age:  35w 4d         75  %    FL/BPD:     70.4   %    71 - 87  FL:       62.6  mm     G. Age:  32w 3d        2.6  %    FL/AC:      19.8   %    20 - 24  HUM:      54.9  mm     G. Age:  32w 0d          5  %  LV:          4  mm  Est. FW:    2563  gm    5 lb 10 oz      50  % ---------------------------------------------------------------------- Gestational Age  LMP:           34w 6d        Date:  06/18/20                 EDD:   03/25/21  U/S Today:     35w 3d  EDD:   03/21/21  Best:          34w 6d     Det. By:  LMP  (06/18/20)          EDD:   03/25/21 ---------------------------------------------------------------------- Anatomy  Cranium:               Previously seen        Aortic Arch:            Previously seen  Cavum:                  Previously seen        Ductal Arch:            Previously seen  Ventricles:            Appears normal         Diaphragm:              Previously seen  Choroid Plexus:        Previously seen        Stomach:                Appears normal, left                                                                        sided  Cerebellum:            Previously seen        Abdomen:                Appears normal  Posterior Fossa:       Previously seen        Abdominal Wall:         Previously seen  Nuchal Fold:           Previously seen        Cord Vessels:           Previously seen  Face:                  Orbits and profile     Kidneys:                Previously seen                         previously seen  Lips:                  Previously seen        Bladder:                Appears normal  Palate:                Previously seen        Spine:                  Appears normal  Heart:                 Appears normal         Upper Extremities:      Previously seen                         (  4CH, axis, and                         situs)  RVOT:                  Appears normal         Lower Extremities:      Previously seen  LVOT:                  Previously seen ---------------------------------------------------------------------- Impression  Follow up growth due to chronic hypertension on labetalol  Normal interval growth with measurements consistent with  dates  Good fetal movement and amniotic fluid volume  Biophysical profile 8/8  Blood pressure is 131/78 mmHg. ---------------------------------------------------------------------- Recommendations  Continue weekly testing.  Consider delivery at 37-39 weeks pending maternal and fetal  status. ----------------------------------------------------------------------               Lin Landsman, MD Electronically Signed Final Report   02/17/2021 04:18 pm ----------------------------------------------------------------------  [place] MFM FETAL BPP WO NON STRESS  Result Date:  02/10/2021 ----------------------------------------------------------------------  OBSTETRICS REPORT                       (Signed Final 02/10/2021 09:01 am) ---------------------------------------------------------------------- Patient Info  ID #:       [id number]                          D.O.B.:  [birth year] (40 yrs)  Name:       Brenda [name]            Visit Date: 02/10/2021 07:47 am ---------------------------------------------------------------------- Performed By  Attending:        Lin Landsman      Ref. Address:     [address]                    MD                                                             [place], [place],                                                             [place] [address]  Performed By:     Lorn Junes,           Location[facility]         [facility]                    RDMS, RDCS                               Fetal Care at  [facility]  Referred By:      Vena Austria MD ---------------------------------------------------------------------- Orders  #  Description                           Code        Ordered By  1  [place] MFM FETAL BPP WO NON               76819.01    RAVI [facility]     STRESS ----------------------------------------------------------------------  #  Order #                     Accession #                Episode #  1  [id number]                   [id number]                 [id number] ---------------------------------------------------------------------- Indications  Pre-existing essential hypertension            O10.013  complicating pregnancy, third trimester  Obesity complicating pregnancy, third          O99.213  trimester  Advanced maternal age multigravida 92+,        O15.523  third trimester  [redacted] weeks gestation of pregnancy                Z3A.33 ---------------------------------------------------------------------- Fetal Evaluation  Num Of Fetuses:         1  Fetal Heart  Rate(bpm):  138  Cardiac Activity:       Observed  Presentation:           Cephalic  Placenta:               Anterior  Amniotic Fluid  AFI FV:      Within normal limits  AFI Sum(cm)     %Tile       Largest Pocket(cm)  12.9            40          4.4  RUQ(cm)       RLQ(cm)       LUQ(cm)        LLQ(cm)  2.2           2.4           4.4            3.9 ---------------------------------------------------------------------- Biophysical Evaluation  Amniotic F.V:   Within normal limits       F. Tone:        Observed  F. Movement:    Observed                   Score:          8/8  F. Breathing:   Observed ---------------------------------------------------------------------- Gestational Age  LMP:           33w 6d        Date:  06/18/20                 EDD:   03/25/21  Best:          33w 6d     Det. By:  LMP  (06/18/20)          EDD:  03/25/21 ---------------------------------------------------------------------- Anatomy  Cranium:               Appears normal         Ductal Arch:            Previously seen  Cavum:                 Appears normal         Diaphragm:              Appears normal  Ventricles:            Appears normal         Stomach:                Appears normal, left                                                                        sided  Choroid Plexus:        Previously seen        Abdomen:                Appears normal  Cerebellum:            Previously seen        Abdominal Wall:         Previously seen  Posterior Fossa:       Previously seen        Cord Vessels:           Appears normal (3                                                                        vessel cord)  Face:                  Orbits and profile     Kidneys:                Appear normal                         previously seen  Lips:                  Previously seen        Bladder:                Appears normal  Heart:                 Previously seen        Spine:                  Previously seen  RVOT:                  Previously  seen        Upper Extremities:      Previously seen  LVOT:                  Previously  seen        Lower Extremities:      Previously seen  Aortic Arch:           Previously seen ---------------------------------------------------------------------- Impression  Antenatal testing performed given maternal chronic  hypertension on medication.  The biophysical profile was 8/8 with good fetal movement and  amniotic fluid volume.  Blood pressure 116/91 mmHg. ---------------------------------------------------------------------- Recommendations  Continue weekly testing.  Follow up growth in 2 weeks. ----------------------------------------------------------------------               Lin Landsman, MD Electronically Signed Final Report   02/10/2021 09:01 am ----------------------------------------------------------------------  [place] MFM OB FOLLOW UP  Result Date: 02/17/2021 ----------------------------------------------------------------------  OBSTETRICS REPORT                       (Signed Final 02/17/2021 04:18 pm) ---------------------------------------------------------------------- Patient Info  ID #:       [id number]                          D.O.B.:  August 30, 1980 (40 yrs)  Name:       Brenda [name]            Visit Date: 02/17/2021 07:59 am ---------------------------------------------------------------------- Performed By  Attending:        Lin Landsman      Ref. Address:     [address]                    MD                                                             [place], [place],                                                             [place] [address]  Performed By:     Francene Boyers           [facility]         [facility]                    RDMS                                     Fetal Care at                                                             [facility]  Referred By:      Vena Austria MD  ---------------------------------------------------------------------- Orders  #  Description  Code        Ordered By  1  [place] MFM OB FOLLOW UP                   [id number]    Lin Landsman  2  [place] MFM FETAL BPP WO NON               76819.01    CORENTHIAN     STRESS                                            BOOKER ----------------------------------------------------------------------  #  Order #                     Accession #                Episode #  1  [id number]                   [id number]                 [id number]  2  [id number]                   [id number]                 [id number] ---------------------------------------------------------------------- Indications  [redacted] weeks gestation of pregnancy                Z3A.27  Advanced maternal age multigravida 71+,        O5.523  third trimester  Hypertension - Chronic/Pre-existing            O10.019  Obesity complicating pregnancy, third          O99.213  trimester ---------------------------------------------------------------------- Fetal Evaluation  Num Of Fetuses:         1  Fetal Heart Rate(bpm):  148  Cardiac Activity:       Observed  Presentation:           Cephalic  Placenta:               Anterior  AFI Sum(cm)     %Tile       Largest Pocket(cm)  10.8            26          4.7  RUQ(cm)       RLQ(cm)       LUQ(cm)        LLQ(cm)  2.5           1.8           4.7            1.8 ---------------------------------------------------------------------- Biophysical Evaluation  Amniotic F.V:   Within normal limits       F. Tone:        Observed  F. Movement:    Observed                   Score:          8/8  F. Breathing:  Observed ---------------------------------------------------------------------- Biometry  BPD:      88.9  mm     G. Age:  36w 0d         81  %    CI:        72.68   %    70 - 86                                                          FL/HC:      18.9   %    20.1 - 22.3   HC:      331.6  mm     G. Age:  37w 6d         85  %    HC/AC:      1.05        0.93 - 1.11  AC:       316   mm     G. Age:  35w 4d         75  %    FL/BPD:     70.4   %    71 - 87  FL:       62.6  mm     G. Age:  32w 3d        2.6  %    FL/AC:      19.8   %    20 - 24  HUM:      54.9  mm     G. Age:  32w 0d          5  %  LV:          4  mm  Est. FW:    2563  gm    5 lb 10 oz      50  % ---------------------------------------------------------------------- Gestational Age  LMP:           34w 6d        Date:  06/18/20                 EDD:   03/25/21  U/S Today:     35w 3d                                        EDD:   03/21/21  Best:          34w 6d     Det. By:  LMP  (06/18/20)          EDD:   03/25/21 ---------------------------------------------------------------------- Anatomy  Cranium:               Previously seen        Aortic Arch:            Previously seen  Cavum:                 Previously seen        Ductal Arch:            Previously seen  Ventricles:            Appears normal         Diaphragm:  Previously seen  Choroid Plexus:        Previously seen        Stomach:                Appears normal, left                                                                        sided  Cerebellum:            Previously seen        Abdomen:                Appears normal  Posterior Fossa:       Previously seen        Abdominal Wall:         Previously seen  Nuchal Fold:           Previously seen        Cord Vessels:           Previously seen  Face:                  Orbits and profile     Kidneys:                Previously seen                         previously seen  Lips:                  Previously seen        Bladder:                Appears normal  Palate:                Previously seen        Spine:                  Appears normal  Heart:                 Appears normal         Upper Extremities:      Previously seen                         (4CH, axis, and                         situs)  RVOT:                   Appears normal         Lower Extremities:      Previously seen  LVOT:                  Previously seen ---------------------------------------------------------------------- Impression  Follow up growth due to chronic hypertension on labetalol  Normal interval growth with measurements consistent with  dates  Good fetal movement and amniotic fluid volume  Biophysical profile 8/8  Blood pressure is 131/78 mmHg. ---------------------------------------------------------------------- Recommendations  Continue weekly testing.  Consider delivery at 37-39 weeks pending maternal and fetal  status. ----------------------------------------------------------------------  Lin Landsman, MD Electronically Signed Final Report   02/17/2021 04:18 pm ----------------------------------------------------------------------    Assessment   40 y.o. G3P1011 at [redacted]w[redacted]d by  03/25/2021, by Last Menstrual Period presenting for routine prenatal visit  Plan   pregnancy 3 Problems (from 08/05/20 to present)     Problem Noted Resolved   Supervision of high risk pregnancy, antepartum 08/05/2020 by Vena Austria, MD No   Overview Addendum 02/28/2021  7:48 PM by Vena Austria, MD    [facility] Prenatal Labs  Dating LMP = 9 week [place] Blood type: O/Positive/-- (02/10 1202)   Genetic Screen NIPS: Normal XY Antibody:Negative (02/10 1202)  Anatomic [place] Normal 5/24 MFM Rubella: 1.64 (02/10 1202) Varicella: Immune  GTT Third trimester: 105 RPR: Non Reactive (02/10 1202)   Rhogam N/A HBsAg: Negative (02/10 1202)   TDaP vaccine                       Flu Shot: HIV: Non Reactive (02/10 1202)   Baby Food                                GBS: negative  Contraception  Pap: 08/05/2020 ASCUS HPV negative  CBB     CS/VBAC N/A   Support Person Husband Quint            Antepartum multigravida of advanced maternal age 59/03/2021 by Vena Austria, MD No   Maternal obesity, antepartum 08/05/2020 by Vena Austria, MD No   Pre-existing essential hypertension during pregnancy, antepartum 08/05/2020 by Vena Austria, MD No        Gestational age appropriate obstetric precautions including but not limited to vaginal bleeding, contractions, leaking of fluid and fetal movement were reviewed in detail with the patient.    1) CHTN - well controlled - continue once weekly NST, once weekly BP - BPP 8/8 on 02/25/2019 - IOL scheduled for 03/15/21  Return in about 1 week (around 03/09/2021) for ROB NST (weekly until 9/20).  Vena Austria, MD, [facility], [facility] 03/02/2021, 2:51 PM

## 2021-03-03 ENCOUNTER — Ambulatory Visit (INDEPENDENT_AMBULATORY_CARE_PROVIDER_SITE_OTHER): Payer: BC Managed Care – PPO | Admitting: Internal Medicine

## 2021-03-03 ENCOUNTER — Encounter: Payer: Self-pay | Admitting: Internal Medicine

## 2021-03-03 VITALS — BP 130/80 | HR 83 | Ht 64.0 in | Wt 252.0 lb

## 2021-03-03 DIAGNOSIS — Z01812 Encounter for preprocedural laboratory examination: Secondary | ICD-10-CM | POA: Diagnosis not present

## 2021-03-03 DIAGNOSIS — I442 Atrioventricular block, complete: Secondary | ICD-10-CM | POA: Diagnosis not present

## 2021-03-03 DIAGNOSIS — I472 Ventricular tachycardia, unspecified: Secondary | ICD-10-CM

## 2021-03-03 DIAGNOSIS — I10 Essential (primary) hypertension: Secondary | ICD-10-CM

## 2021-03-03 NOTE — Patient Instructions (Addendum)
Medication Instructions:  - Your physician recommends that you continue on your current medications as directed. Please refer to the Current Medication list given to you today.  *If you need a refill on your cardiac medications before your next appointment, please call your pharmacy*   Lab Work: - Your physician recommends that you return for lab work in:  2 weeks prior to your Cardiac MRI, which we will arrange when we know the date of test- CBC   If you have labs (blood work) drawn today and your tests are completely normal, you will receive your results only by: MyChart Message (if you have MyChart) OR A paper copy in the mail If you have any lab test that is abnormal or we need to change your treatment, we will call you to review the results.   Testing/Procedures:  1) Cardiac MRI (in November):  You are scheduled for Cardiac MRI on ___________.   Please arrive at the [facility] main entrance of [facility] at ________________ (30-45 minutes prior to test start time).  ?  [facility]  [address]  [place], [place] [address]  [phone]  Proceed to the [facility] Radiology Department (First Floor).  ?  Magnetic resonance imaging (MRI) is a painless test that produces images of the inside of the body without using X-rays. During an MRI, strong magnets and radio waves work together in a Data processing manager to form detailed images. MRI images may provide more details about a medical condition than X-rays, CT scans, and ultrasounds can provide.  You may be given earphones to listen for instructions.   - You may eat a light breakfast and take medications as ordererd  - If a contrast material will be used, an IV will be inserted into one of your veins. Contrast material will be injected into your IV.  - You will be asked to remove all metal, including: Watch, jewelry, and other metal objects including hearing aids, hair pieces and dentures. (Braces and fillings  normally are not a problem.)  If contrast material was used:  - It will leave your body through your urine within a day. You may be told to drink plenty of fluids to help flush the contrast material out of your system.   TEST WILL TAKE APPROXIMATELY 1 HOUR  PLEASE NOTIFY SCHEDULING AT LEAST 24 HOURS IN ADVANCE IF YOU ARE UNABLE TO KEEP YOUR APPOINTMENT.      Follow-Up: At [facility], you and your health needs are our priority.  As part of our continuing mission to provide you with exceptional heart care, we have created designated Provider Care Teams.  These Care Teams include your primary Cardiologist (physician) and Advanced Practice Providers (APPs -  Physician Assistants and Nurse Practitioners) who all work together to provide you with the care you need, when you need it.  We recommend signing up for the patient portal called "MyChart".  Sign up information is provided on this After Visit Summary.  MyChart is used to connect with patients for Virtual Visits (Telemedicine).  Patients are able to view lab/test results, encounter notes, upcoming appointments, etc.  Non-urgent messages can be sent to your provider as well.   To learn more about what you can do with MyChart, go to [email or web address].    Your next appointment:   6-8 month(s)  The format for your next appointment:   In Person  Provider:   Sherryl Manges, MD   Other Instructions N/a

## 2021-03-03 NOTE — Progress Notes (Signed)
Patient Care Team: Duanne Limerick, MD as PCP - General (Family Medicine) Debbe Odea, MD as PCP - Cardiology (Cardiology)   HPI  Brenda [name] is a 40 y.o. female originally seen 3/22 at the beginning of her pregnancy now Seen in follow-up for syncope for which she underwent event monitoring.  This demonstrated 2 distinct findings, the first was high-grade heart block occurring with concomitant sinus node slowing and AV block suggestive of hyper vagotonia occurring in the context of hyperemesis with her pregnancy.  The second was nonsustained VT that was unassociated with syncope.  Nonsustained VT was not anticipated.  Cardiac evaluation included an echocardiogram which was normal and a single average ECG which was normal  Palpitations are much quieter as she goes through her pregnancy.  No recurrent syncope scheduled to be induced in 10 days or so  History of hypertension dating back to her late 34s for which she takes labetalol; does have sleep disordered breathing   Adderall for adult ADD  DATE TEST EF   3/22 Echo   60-65 %                  Records and Results Reviewed    Past Medical History:  Diagnosis Date   ADHD, adult residual type    Anxiety    Depression    GERD (gastroesophageal reflux disease)    Hypertension    Wears contact lenses     Past Surgical History:  Procedure Laterality Date   BREAST REDUCTION SURGERY     COLONOSCOPY WITH PROPOFOL N/A 11/09/2016   Procedure: COLONOSCOPY WITH PROPOFOL;  Surgeon: [name] Minium, MD;  Location: [facility];  Service: Endoscopy;  Laterality: N/A;   ESOPHAGOGASTRODUODENOSCOPY (EGD) WITH PROPOFOL N/A 11/09/2016   Procedure: ESOPHAGOGASTRODUODENOSCOPY (EGD) WITH PROPOFOL;  Surgeon: [name] Minium, MD;  Location: [facility];  Service: Endoscopy;  Laterality: N/A;   TONSILLECTOMY      Current Meds  Medication Sig   aspirin 81 MG chewable tablet Chew 81 mg by mouth daily.   hydrOXYzine  (ATARAX/VISTARIL) 25 MG tablet Take 1 tablet (25 mg total) by mouth every 8 (eight) hours as needed for anxiety.   labetalol (NORMODYNE) 200 MG tablet TAKE 1 TABLET BY MOUTH TWICE A DAY   pantoprazole (PROTONIX) 40 MG tablet TAKE 1 TABLET BY MOUTH EVERY DAY   promethazine (PHENERGAN) 25 MG tablet Take 1 tablet (25 mg total) by mouth every 12 (twelve) hours as needed for nausea or vomiting.   traZODone (DESYREL) 100 MG tablet Take 200 mg by mouth at bedtime.    Allergies  Allergen Reactions   Ace Inhibitors Swelling    Lips/ face      Review of Systems negative except from HPI and PMH  Physical Exam BP 130/80 (BP Location: Left Arm, Patient Position: Sitting, Cuff Size: Large)   Pulse 83   Ht 5\' 4"  (1.626 m)   Wt 252 lb (114.3 kg)   LMP 06/18/2020 (Exact Date)   SpO2 98%   BMI 43.26 kg/m  Well developed and well nourished in no acute distress HENT normal E scleral and icterus clear Neck Supple JVP flat; carotids brisk and full Clear to ausculation Regular rate and rhythm, no murmurs gallops or rub No clubbing cyanosis  Edema Alert and oriented, grossly normal motor and sensory function Skin Warm and Dry  ECG sinus at 83 Intervals 16/07/37  CrCl cannot be calculated (Patient's most recent lab result is older than the  maximum 21 days allowed.).   Assessment and  Plan  Ventricular tachycardia-nonsustained   Pauses associated with simultaneous sinus slowing and AV nodal conduction, first-degree AV block giving rise to complete heart block   Pregnancy with significant hyperemesis   Anxiety   Hypertension   Orthostatic dizziness    We will plan cMRI to look at her heart given the ventricular tachycardia which was not morphologically consistent with RVOT VT; single average was normal which was reassuring  Pregnancy is much better.  Approaching the end.  Blood pressure is well controlled on her labetalol.  Have suggested that she discuss with her PCP a sleep study  given her young age with her hypertension      Current medicines are reviewed at length with the patient today .  The patient does not  have concerns regarding medicines.

## 2021-03-07 ENCOUNTER — Telehealth: Payer: Self-pay

## 2021-03-07 NOTE — Telephone Encounter (Signed)
Pt calling; is due 9/30th; just had a little bit of spotting today; it has slowed now; what to do?  [phone]  Pt denies IC; admits to hard BM; states it was just when she wiped; only happened one time; feels silly for calling; adv that is what we are here for.  Pt aware if starts bleeding like a period to go to hosp.

## 2021-03-09 ENCOUNTER — Other Ambulatory Visit: Payer: Self-pay

## 2021-03-09 ENCOUNTER — Encounter: Payer: Self-pay | Admitting: Obstetrics and Gynecology

## 2021-03-09 ENCOUNTER — Ambulatory Visit (INDEPENDENT_AMBULATORY_CARE_PROVIDER_SITE_OTHER): Payer: BC Managed Care – PPO | Admitting: Obstetrics and Gynecology

## 2021-03-09 VITALS — BP 148/88 | Wt 246.0 lb

## 2021-03-09 DIAGNOSIS — O0993 Supervision of high risk pregnancy, unspecified, third trimester: Secondary | ICD-10-CM | POA: Diagnosis not present

## 2021-03-09 DIAGNOSIS — O99210 Obesity complicating pregnancy, unspecified trimester: Secondary | ICD-10-CM

## 2021-03-09 DIAGNOSIS — Z3A37 37 weeks gestation of pregnancy: Secondary | ICD-10-CM | POA: Diagnosis not present

## 2021-03-09 DIAGNOSIS — O10019 Pre-existing essential hypertension complicating pregnancy, unspecified trimester: Secondary | ICD-10-CM

## 2021-03-09 DIAGNOSIS — O09523 Supervision of elderly multigravida, third trimester: Secondary | ICD-10-CM

## 2021-03-09 NOTE — Progress Notes (Signed)
Routine Prenatal Care Visit  Subjective  Brenda [name] is a 40 y.o. G3P1011 at [redacted]w[redacted]d being seen today for ongoing prenatal care.  She is currently monitored for the following issues for this high-risk pregnancy and has Adult ADHD; Blood in stool; Rectal polyp; Heartburn; Nausea; Epigastric abdominal pain; Abdominal pain; Gastroesophageal reflux disease; Essential hypertension; Performance anxiety; Supervision of high risk pregnancy, antepartum; Antepartum multigravida of advanced maternal age; Maternal obesity, antepartum; and Pre-existing essential hypertension during pregnancy, antepartum on their problem list.  ----------------------------------------------------------------------------------- Patient reports no complaints.  Denies HA, visual changes, RUQ pain Contractions: Irregular. Vag. Bleeding: None.  Movement: Present. Leaking Fluid denies.  ----------------------------------------------------------------------------------- The following portions of the patient's history were reviewed and updated as appropriate: allergies, current medications, past family history, past medical history, past social history, past surgical history and problem list. Problem list updated.  Objective  Blood pressure (!) 148/88, weight 246 lb (111.6 kg), last menstrual period 06/18/2020. Pregravid weight 190 lb (86.2 kg) Total Weight Gain 56 lb (25.4 kg) Urinalysis: Urine Protein    Urine Glucose    Fetal Status: Fetal Heart Rate (bpm): 140   Movement: Present     General:  Alert, oriented and cooperative. Patient is in no acute distress.  Skin: Skin is warm and dry. No rash noted.   Cardiovascular: Normal heart rate noted  Respiratory: Normal respiratory effort, no problems with respiration noted  Abdomen: Soft, gravid, appropriate for gestational age. Pain/Pressure: Absent     Pelvic:  Cervical exam deferred        Extremities: Normal range of motion.     Mental Status: Normal mood and affect.  Normal behavior. Normal judgment and thought content.   NST: Baseline FHR: 140 beats/min Variability: moderate Accelerations: present Decelerations: absent Tocometry: not done  Interpretation:  INDICATIONS: chronic hypertension RESULTS:  A NST procedure was performed with FHR monitoring and a normal baseline established, appropriate time of 20-40 minutes of evaluation, and accels >2 seen w 15x15 characteristics.  Results show a REACTIVE NST.    Assessment   40 y.o. G3P1011 at [redacted]w[redacted]d by  03/25/2021, by Last Menstrual Period presenting for routine prenatal visit  Plan   pregnancy 3 Problems (from 08/05/20 to present)     Problem Noted Resolved   Supervision of high risk pregnancy, antepartum 08/05/2020 by Vena Austria, MD No   Overview Addendum 02/28/2021  7:48 PM by Vena Austria, MD    [facility] Prenatal Labs  Dating LMP = 9 week [place] Blood type: O/Positive/-- (02/10 1202)   Genetic Screen NIPS: Normal XY Antibody:Negative (02/10 1202)  Anatomic [place] Normal 5/24 MFM Rubella: 1.64 (02/10 1202) Varicella: Immune  GTT Third trimester: 105 RPR: Non Reactive (02/10 1202)   Rhogam N/A HBsAg: Negative (02/10 1202)   TDaP vaccine                       Flu Shot: HIV: Non Reactive (02/10 1202)   Baby Food                                GBS: negative  Contraception  Pap: 08/05/2020 ASCUS HPV negative  CBB     CS/VBAC N/A   Support Person Husband Quint            Antepartum multigravida of advanced maternal age 78/03/2021 by Vena Austria, MD No   Maternal obesity, antepartum 08/05/2020 by Vena Austria, MD No  Pre-existing essential hypertension during pregnancy, antepartum 08/05/2020 by Vena Austria, MD No        Term labor symptoms and general obstetric precautions including but not limited to vaginal bleeding, contractions, leaking of fluid and fetal movement were reviewed in detail with the patient. Please refer to After Visit Summary for other counseling  recommendations.   Return for keep IOL appt for 9/20.   Thomasene Mohair, MD, [facility], [facility] 03/09/2021 3:06 PM

## 2021-03-15 ENCOUNTER — Inpatient Hospital Stay: Payer: BC Managed Care – PPO | Admitting: Anesthesiology

## 2021-03-15 ENCOUNTER — Encounter: Payer: Self-pay | Admitting: Obstetrics and Gynecology

## 2021-03-15 ENCOUNTER — Inpatient Hospital Stay
Admission: RE | Admit: 2021-03-15 | Discharge: 2021-03-17 | DRG: 806 | Disposition: A | Discharge status: Home / Self Care | Payer: BC Managed Care – PPO | Attending: Obstetrics and Gynecology | Admitting: Obstetrics and Gynecology

## 2021-03-15 ENCOUNTER — Other Ambulatory Visit: Payer: Self-pay

## 2021-03-15 DIAGNOSIS — Z20822 Contact with and (suspected) exposure to covid-19: Secondary | ICD-10-CM | POA: Diagnosis present

## 2021-03-15 DIAGNOSIS — O1002 Pre-existing essential hypertension complicating childbirth: Secondary | ICD-10-CM | POA: Diagnosis not present

## 2021-03-15 DIAGNOSIS — D62 Acute posthemorrhagic anemia: Secondary | ICD-10-CM | POA: Diagnosis not present

## 2021-03-15 DIAGNOSIS — O9081 Anemia of the puerperium: Secondary | ICD-10-CM | POA: Diagnosis not present

## 2021-03-15 DIAGNOSIS — O10019 Pre-existing essential hypertension complicating pregnancy, unspecified trimester: Secondary | ICD-10-CM | POA: Diagnosis present

## 2021-03-15 DIAGNOSIS — Z3A38 38 weeks gestation of pregnancy: Secondary | ICD-10-CM

## 2021-03-15 DIAGNOSIS — O10013 Pre-existing essential hypertension complicating pregnancy, third trimester: Secondary | ICD-10-CM | POA: Diagnosis not present

## 2021-03-15 LAB — RESP PANEL BY RT-PCR (FLU A&B, COVID) ARPGX2
Influenza A by PCR: NEGATIVE
Influenza B by PCR: NEGATIVE
SARS Coronavirus 2 by RT PCR: NEGATIVE

## 2021-03-15 LAB — CBC
HCT: 35.7 % — ABNORMAL LOW (ref 36.0–46.0)
Hemoglobin: 12.2 g/dL (ref 12.0–15.0)
MCH: 30.2 pg (ref 26.0–34.0)
MCHC: 34.2 g/dL (ref 30.0–36.0)
MCV: 88.4 fL (ref 80.0–100.0)
Platelets: 269 10*3/uL (ref 150–400)
RBC: 4.04 MIL/uL (ref 3.87–5.11)
RDW: 12.4 % (ref 11.5–15.5)
WBC: 13.8 10*3/uL — ABNORMAL HIGH (ref 4.0–10.5)
nRBC: 0 % (ref 0.0–0.2)

## 2021-03-15 LAB — COMPREHENSIVE METABOLIC PANEL
ALT: 13 U/L (ref 0–44)
AST: 18 U/L (ref 15–41)
Albumin: 2.9 g/dL — ABNORMAL LOW (ref 3.5–5.0)
Alkaline Phosphatase: 84 U/L (ref 38–126)
Anion gap: 9 (ref 5–15)
BUN: 9 mg/dL (ref 6–20)
CO2: 22 mmol/L (ref 22–32)
Calcium: 9.2 mg/dL (ref 8.9–10.3)
Chloride: 104 mmol/L (ref 98–111)
Creatinine, Ser: 0.57 mg/dL (ref 0.44–1.00)
GFR, Estimated: >60 mL/min (ref >60)
Glucose, Bld: 93 mg/dL (ref 70–99)
Potassium: 3.9 mmol/L (ref 3.5–5.1)
Sodium: 135 mmol/L (ref 135–145)
Total Bilirubin: 0.5 mg/dL (ref 0.3–1.2)
Total Protein: 6.6 g/dL (ref 6.5–8.1)

## 2021-03-15 LAB — TYPE AND SCREEN
ABO/RH(D): O POS
Antibody Screen: NEGATIVE

## 2021-03-15 LAB — ABO/RH: ABO/RH(D): O POS

## 2021-03-15 LAB — PROTEIN / CREATININE RATIO, URINE
Creatinine, Urine: 36 mg/dL
Total Protein, Urine: <6 mg/dL

## 2021-03-15 MED ORDER — AMMONIA AROMATIC IN INHA
RESPIRATORY_TRACT | Status: AC
  Filled 2021-03-15: qty 10

## 2021-03-15 MED ORDER — DIPHENHYDRAMINE HCL 50 MG/ML IJ SOLN: 12.5000 mg | INTRAMUSCULAR | Status: DC | PRN

## 2021-03-15 MED ORDER — MISOPROSTOL 25 MCG QUARTER TABLET
25.0000 ug | ORAL_TABLET | ORAL | Status: DC | PRN
  Administered 2021-03-15: 25 ug via VAGINAL
  Filled 2021-03-15 (×2): qty 1

## 2021-03-15 MED ORDER — PHENYLEPHRINE 40 MCG/ML (10ML) SYRINGE FOR IV PUSH (FOR BLOOD PRESSURE SUPPORT)
80.0000 ug | PREFILLED_SYRINGE | INTRAVENOUS | Status: DC | PRN
  Filled 2021-03-15: qty 10

## 2021-03-15 MED ORDER — TERBUTALINE SULFATE 1 MG/ML IJ SOLN: 0.2500 mg | Freq: Once | INTRAMUSCULAR | Status: DC | PRN

## 2021-03-15 MED ORDER — ONDANSETRON HCL 4 MG/2ML IJ SOLN: 4.0000 mg | Freq: Four times a day (QID) | INTRAMUSCULAR | Status: DC | PRN

## 2021-03-15 MED ORDER — EPHEDRINE 5 MG/ML INJ
10.0000 mg | INTRAVENOUS | Status: DC | PRN
  Filled 2021-03-15: qty 2

## 2021-03-15 MED ORDER — OXYTOCIN BOLUS FROM INFUSION: 333.0000 mL | Freq: Once | INTRAVENOUS | Status: DC

## 2021-03-15 MED ORDER — LACTATED RINGERS IV SOLN: 500.0000 mL | Freq: Once | INTRAVENOUS | Status: DC

## 2021-03-15 MED ORDER — BUPIVACAINE HCL (PF) 0.25 % IJ SOLN
INTRAMUSCULAR | Status: DC | PRN
  Administered 2021-03-15 (×2): 3 mL via EPIDURAL

## 2021-03-15 MED ORDER — FENTANYL-BUPIVACAINE-NACL 0.5-0.125-0.9 MG/250ML-% EP SOLN
EPIDURAL | Status: AC
  Filled 2021-03-15: qty 250

## 2021-03-15 MED ORDER — LIDOCAINE-EPINEPHRINE (PF) 1.5 %-1:200000 IJ SOLN
INTRAMUSCULAR | Status: DC | PRN
  Administered 2021-03-15: 2 mL via PERINEURAL
  Administered 2021-03-15: 3 mL via PERINEURAL

## 2021-03-15 MED ORDER — LACTATED RINGERS IV SOLN
500.0000 mL | INTRAVENOUS | Status: DC | PRN
Start: 2021-03-15 — End: 2021-03-16

## 2021-03-15 MED ORDER — FENTANYL-BUPIVACAINE-NACL 0.5-0.125-0.9 MG/250ML-% EP SOLN
12.0000 mL/h | EPIDURAL | Status: DC | PRN
  Administered 2021-03-15: 12 mL/h via EPIDURAL

## 2021-03-15 MED ORDER — OXYTOCIN-SODIUM CHLORIDE 30-0.9 UT/500ML-% IV SOLN
1.0000 m[IU]/min | INTRAVENOUS | Status: DC
  Administered 2021-03-15: 2 m[IU]/min via INTRAVENOUS

## 2021-03-15 MED ORDER — ACETAMINOPHEN 325 MG PO TABS: 650.0000 mg | ORAL_TABLET | ORAL | Status: DC | PRN

## 2021-03-15 MED ORDER — OXYTOCIN-SODIUM CHLORIDE 30-0.9 UT/500ML-% IV SOLN
2.5000 [IU]/h | INTRAVENOUS | Status: DC
  Filled 2021-03-15: qty 1000

## 2021-03-15 MED ORDER — BUTORPHANOL TARTRATE 1 MG/ML IJ SOLN: 1.0000 mg | INTRAMUSCULAR | Status: DC | PRN

## 2021-03-15 MED ORDER — LACTATED RINGERS IV SOLN: INTRAVENOUS | Status: DC

## 2021-03-15 MED ORDER — OXYTOCIN 10 UNIT/ML IJ SOLN
INTRAMUSCULAR | Status: AC
  Filled 2021-03-15: qty 2

## 2021-03-15 MED ORDER — LIDOCAINE HCL (PF) 1 % IJ SOLN
30.0000 mL | INTRAMUSCULAR | Status: DC | PRN
  Filled 2021-03-15: qty 30

## 2021-03-15 MED ORDER — SOD CITRATE-CITRIC ACID 500-334 MG/5ML PO SOLN: 30.0000 mL | ORAL | Status: DC | PRN

## 2021-03-15 MED ORDER — MISOPROSTOL 200 MCG PO TABS
ORAL_TABLET | ORAL | Status: AC
  Administered 2021-03-16: 800 ug
  Filled 2021-03-15: qty 4

## 2021-03-15 NOTE — Anesthesia Preprocedure Evaluation (Addendum)
Anesthesia Evaluation  Patient identified by MRN, date of birth, ID band Patient awake    Reviewed: Allergy & Precautions, NPO status , Patient's Chart, lab work & pertinent test results  History of Anesthesia Complications Negative for: history of anesthetic complications (no family hx)  Airway Mallampati: III   Neck ROM: Full    Dental   Pulmonary neg pulmonary ROS,           Cardiovascular hypertension,  Rhythm:Regular Rate:Normal     Neuro/Psych Anxiety Depression Current headache    GI/Hepatic Neg liver ROS, GERD  ,  Endo/Other  Morbid obesity  Renal/GU negative Renal ROS  negative genitourinary   Musculoskeletal negative musculoskeletal ROS (+)   Abdominal   Peds  Hematology   Anesthesia Other Findings   Reproductive/Obstetrics IOL for chronic HTN                            Anesthesia Physical Anesthesia Plan  ASA: 3  Anesthesia Plan: Epidural   Post-op Pain Management:    Induction:   PONV Risk Score and Plan:   Airway Management Planned:   Additional Equipment:   Intra-op Plan:   Post-operative Plan:   Informed Consent: I have reviewed the patients History and Physical, chart, labs and discussed the procedure including the risks, benefits and alternatives for the proposed anesthesia with the patient or authorized representative who has indicated his/her understanding and acceptance.       Plan Discussed with:   Anesthesia Plan Comments: (Patient requested a labor epidural. Patient seen and examined prior to placement of epidural (note entered afterwards).  Risks and benefits discussed, including bleeding, infection, nerve injury, reaction to medication, failure, and headache.  She understands and wishes to proceed.  )        Anesthesia Quick Evaluation

## 2021-03-15 NOTE — H&P (Signed)
Obstetric H&P   Chief Complaint: IOL  Prenatal Care Provider: [facility]  History of Present Illness: 40 y.o. G3P1011 [redacted]w[redacted]d by 03/25/2021, by Last Menstrual Period presenting to L&D with personal history of chronic hypertension.  Serial growth scan have revealed appropriate growth and AFI, and BP have remained well controlled throughout pregnancy.   Last growth 03/20/2021 35 weeks 5lbs 10oz or 2563g 50%ile.  +FM, no LOF, no VB, has had some irregular contractions  Pregravid weight 86.2 kg Total Weight Gain 25.4 kg  pregnancy 3 Problems (from 08/05/20 to present)     Problem Noted Resolved   Supervision of high risk pregnancy, antepartum 08/05/2020 by Vena Austria, MD No   Overview Addendum 02/28/2021  7:48 PM by Vena Austria, MD    [facility] Prenatal Labs  Dating LMP = 9 week [place] Blood type: O/Positive/-- (02/10 1202)   Genetic Screen NIPS: Normal XY Antibody:Negative (02/10 1202)  Anatomic [place] Normal 5/24 MFM Rubella: 1.64 (02/10 1202) Varicella: Immune  GTT Third trimester: 105 RPR: Non Reactive (02/10 1202)   Rhogam N/A HBsAg: Negative (02/10 1202)   TDaP vaccine                       Flu Shot: HIV: Non Reactive (02/10 1202)   Baby Food                                GBS: negative  Contraception  Pap: 08/05/2020 ASCUS HPV negative  CBB     CS/VBAC N/A   Support Person Husband Quint            Antepartum multigravida of advanced maternal age 23/03/2021 by Vena Austria, MD No   Maternal obesity, antepartum 08/05/2020 by Vena Austria, MD No   Pre-existing essential hypertension during pregnancy, antepartum 08/05/2020 by Vena Austria, MD No        Review of Systems: 10 point review of systems negative unless otherwise noted in HPI  Past Medical History: Patient Active Problem List   Diagnosis Date Noted   Chronic benign essential hypertension, antepartum 03/15/2021   Supervision of high risk pregnancy, antepartum 08/05/2020    [facility] Prenatal  Labs  Dating LMP = 9 week [place] Blood type: O/Positive/-- (02/10 1202)   Genetic Screen NIPS: Normal XY Antibody:Negative (02/10 1202)  Anatomic [place] Normal 5/24 MFM Rubella: 1.64 (02/10 1202) Varicella: Immune  GTT Third trimester: 105 RPR: Non Reactive (02/10 1202)   Rhogam N/A HBsAg: Negative (02/10 1202)   TDaP vaccine                       Flu Shot: HIV: Non Reactive (02/10 1202)   Baby Food                                GBS: negative  Contraception  Pap: 08/05/2020 ASCUS HPV negative  CBB     CS/VBAC N/A   Support Person Husband Quint          Antepartum multigravida of advanced maternal age 15/03/2021   Maternal obesity, antepartum 08/05/2020   Pre-existing essential hypertension during pregnancy, antepartum 08/05/2020   Gastroesophageal reflux disease 07/10/2019   Essential hypertension 07/10/2019    increased HCTZ to 25mg - recheck B/P in 4 weeks    Performance anxiety 07/10/2019   Epigastric abdominal pain 03/10/2018   Abdominal  pain    Blood in stool    Rectal polyp    Heartburn    Nausea    Adult ADHD 12/07/2014    Past Surgical History: Past Surgical History:  Procedure Laterality Date   BREAST REDUCTION SURGERY     COLONOSCOPY WITH PROPOFOL N/A 11/09/2016   Procedure: COLONOSCOPY WITH PROPOFOL;  Surgeon: [name] Minium, MD;  Location: [facility];  Service: Endoscopy;  Laterality: N/A;   ESOPHAGOGASTRODUODENOSCOPY (EGD) WITH PROPOFOL N/A 11/09/2016   Procedure: ESOPHAGOGASTRODUODENOSCOPY (EGD) WITH PROPOFOL;  Surgeon: [name] Minium, MD;  Location: [facility];  Service: Endoscopy;  Laterality: N/A;   TONSILLECTOMY      Past Obstetric History: # 1 - Date: None, Sex: None, Weight: None, GA: None, Delivery: None, Apgar1: None, Apgar5: None, Living: None, Birth Comments: None  # 2 - Date: 05/13/13, Sex: Female, Weight: 3402 g, GA: None, Delivery: Vaginal, Spontaneous, Apgar1: None, Apgar5: None, Living: Living, Birth Comments: None  # 3 - Date: None,  Sex: None, Weight: None, GA: None, Delivery: None, Apgar1: None, Apgar5: None, Living: None, Birth Comments: None   Past Gynecologic History:  Family History: Family History  Problem Relation Age of Onset   Heart disease Maternal Grandmother    Heart disease Maternal Grandfather    Diabetes Paternal Grandfather    Hypertension Mother    Hypertension Father     Social History: Social History   Socioeconomic History   Marital status: Married    Spouse name: Lewis   Number of children: Not on file   Years of education: Not on file   Highest education level: Not on file  Occupational History   Occupation: Teacher, adult education   Tobacco Use   Smoking status: Never   Smokeless tobacco: Never  Vaping Use   Vaping Use: Never used  Substance and Sexual Activity   Alcohol use: Not Currently    Alcohol/week: 10.0 standard drinks    Types: 5 Glasses of wine, 5 Cans of beer per week   Drug use: No   Sexual activity: Yes    Birth control/protection: None  Other Topics Concern   Not on file  Social History Narrative   Not on file   Social Determinants of Health   Financial Resource Strain: Not on file  Food Insecurity: Not on file  Transportation Needs: Not on file  Physical Activity: Not on file  Stress: Not on file  Social Connections: Not on file  Intimate Partner Violence: Not on file    Medications: Prior to Admission medications   Medication Sig Start Date End Date Taking? Authorizing Provider  hydrOXYzine (ATARAX/VISTARIL) 25 MG tablet Take 1 tablet (25 mg total) by mouth every 8 (eight) hours as needed for anxiety. 12/15/20  Yes Vena Austria, MD  labetalol (NORMODYNE) 200 MG tablet TAKE 1 TABLET BY MOUTH TWICE A DAY 11/30/20  Yes Vena Austria, MD  pantoprazole (PROTONIX) 40 MG tablet TAKE 1 TABLET BY MOUTH EVERY DAY 02/18/21  Yes Mirna Mires, CNM  promethazine (PHENERGAN) 25 MG tablet Take 1 tablet (25 mg total) by mouth every 12 (twelve) hours as needed for nausea  or vomiting. 02/24/21  Yes Vena Austria, MD  traZODone (DESYREL) 100 MG tablet Take 200 mg by mouth at bedtime. 07/17/20  Yes [provider]  ALPRAZolam Prudy Feeler) 1 MG tablet Take 1 mg by mouth 3 (three) times daily as needed. Patient not taking: No sig reported 02/21/21   [provider]  amphetamine-dextroamphetamine (ADDERALL XR) 20 MG 24 hr  capsule Take 20 mg by mouth every morning. Patient not taking: No sig reported 02/01/21   [provider]  amphetamine-dextroamphetamine (ADDERALL) 20 MG tablet Take 20 mg by mouth 2 (two) times daily. Patient not taking: No sig reported    [provider]  aspirin 81 MG chewable tablet Chew 81 mg by mouth daily. Patient not taking: Reported on 03/09/2021    [provider]    Allergies: Allergies  Allergen Reactions   Ace Inhibitors Swelling    Lips/ face    Physical Exam: Vitals: Blood pressure 118/77, pulse 86, temperature 99 F (37.2 C), temperature source Oral, resp. rate 18, height 5\' 4"  (1.626 m), weight 111.6 kg, last menstrual period 06/18/2020.  Urine Dip Protein: N/A  FHT: 140, moderate, +accels, no decels Toco: none  General: NAD HEENT: normocephalic, anicteric Pulmonary: No increased work of breathing Cardiovascular: RRR, distal pulses 2+ Abdomen: Gravid, non-tender Leopolds: Genitourinary:  Dilation: 2 Effacement (%): Thick Station: -3 Presentation: Vertex Exam by:: [email or web address], RN Roxine Caddy cytotec and foley bulb placed Extremities: no edema, erythema, or tenderness Neurologic: Grossly intact Psychiatric: mood appropriate, affect full  Labs: Results for orders placed or performed during the hospital encounter of 03/15/21 (from the past 24 hour(s))  CBC     Status: Abnormal   Collection Time: 03/15/21  1:16 PM  Result Value Ref Range   WBC 13.8 (H) 4.0 - 10.5 K/uL   RBC 4.04 3.87 - 5.11 MIL/uL   Hemoglobin 12.2 12.0 - 15.0 g/dL   HCT 03/17/21 (L) 18.8 - 41.6 %   MCV 88.4  80.0 - 100.0 fL   MCH 30.2 26.0 - 34.0 pg   MCHC 34.2 30.0 - 36.0 g/dL   RDW 60.6 30.1 - 60.1 %   Platelets 269 150 - 400 K/uL   nRBC 0.0 0.0 - 0.2 %  Type and screen     Status: None (Preliminary result)   Collection Time: 03/15/21  1:16 PM  Result Value Ref Range   ABO/RH(D) PENDING    Antibody Screen PENDING    Sample Expiration      03/18/2021,2359 Performed at [facility], [address] [place], [place]     Assessment: 40 y.o. [phone] [redacted]w[redacted]d by 03/25/2021, by Last Menstrual Period presenting for IOL chronic hypertension  Plan: 1) IOL - proceed with cyotec IOL  2) Fetus - cat I tracing  3) PNL - Blood type --/--/PENDING (09/20 1316) / Anti-bodyscreen PENDING (09/20 1316) / Rubella 1.64 (02/10 1202) / Varicella Immune / RPR Non Reactive (07/20 1535) / HBsAg Negative (02/10 1202) / HIV Non Reactive (07/20 1535) / 1-hr OGTT 105 / GBS Negative/-- (09/01 1048)  4) Immunization History -  Immunization History  Administered Date(s) Administered   Influenza,inj,Quad PF,6+ Mos 03/20/2018, 07/10/2019, 08/26/2020   PFIZER(Purple Top)SARS-COV-2 Vaccination 08/21/2019, 09/17/2019, 03/23/2020   Tdap 02/15/2021    5) Disposition - pending delivery  02/17/2021, MD, Vena Austria OB/GYN, [facility] 03/15/2021, 2:02 PM

## 2021-03-15 NOTE — Progress Notes (Signed)
Subjective:  Comfortable epdidural in place  Objective:   Vitals: Blood pressure (!) 112/43, pulse 79, temperature 98.2 F (36.8 C), resp. rate 18, height 5\' 4"  (1.626 m), weight 111.6 kg, last menstrual period 06/18/2020, SpO2 91 %. General: NAD Abdomen: gravid, non-tender Cervical Exam:  Dilation: 4.5 Effacement (%): 70 Cervical Position: Posterior Station: -3 Presentation: Vertex Exam by:: [email or web address] RN AROM clear  FHT: 140, moderate, +Accels, no decels Toco: q2-57min  Results for orders placed or performed during the hospital encounter of 03/15/21 (from the past 24 hour(s))  Resp Panel by RT-PCR (Flu A&B, Covid) Nasopharyngeal Swab     Status: None   Collection Time: 03/15/21  1:16 PM   Specimen: Nasopharyngeal Swab; Nasopharyngeal(NP) swabs in vial transport medium  Result Value Ref Range   SARS Coronavirus 2 by RT PCR NEGATIVE NEGATIVE   Influenza A by PCR NEGATIVE NEGATIVE   Influenza B by PCR NEGATIVE NEGATIVE  CBC     Status: Abnormal   Collection Time: 03/15/21  1:16 PM  Result Value Ref Range   WBC 13.8 (H) 4.0 - 10.5 K/uL   RBC 4.04 3.87 - 5.11 MIL/uL   Hemoglobin 12.2 12.0 - 15.0 g/dL   HCT 03/17/21 (L) 46.9 - 62.9 %   MCV 88.4 80.0 - 100.0 fL   MCH 30.2 26.0 - 34.0 pg   MCHC 34.2 30.0 - 36.0 g/dL   RDW 52.8 41.3 - 24.4 %   Platelets 269 150 - 400 K/uL   nRBC 0.0 0.0 - 0.2 %  Type and screen     Status: None   Collection Time: 03/15/21  1:16 PM  Result Value Ref Range   ABO/RH(D) O POS    Antibody Screen NEG    Sample Expiration      03/18/2021,2359 Performed at [facility], [address] [place], [place]   Comprehensive metabolic panel     Status: Abnormal   Collection Time: 03/15/21  1:16 PM  Result Value Ref Range   Sodium 135 135 - 145 mmol/L   Potassium 3.9 3.5 - 5.1 mmol/L   Chloride 104 98 - 111 mmol/L   CO2 22 22 - 32 mmol/L   Glucose, Bld 93 70 - 99 mg/dL   BUN 9 6 - 20 mg/dL   Creatinine, Ser 03/17/21 0.44 - 1.00  mg/dL   Calcium 9.2 8.9 - 6.64 mg/dL   Total Protein 6.6 6.5 - 8.1 g/dL   Albumin 2.9 (L) 3.5 - 5.0 g/dL   AST 18 15 - 41 U/L   ALT 13 0 - 44 U/L   Alkaline Phosphatase 84 38 - 126 U/L   Total Bilirubin 0.5 0.3 - 1.2 mg/dL   GFR, Estimated 40.3 >47 mL/min   Anion gap 9 5 - 15  Protein / creatinine ratio, urine     Status: None   Collection Time: 03/15/21  1:16 PM  Result Value Ref Range   Creatinine, Urine 36 mg/dL   Total Protein, Urine <6 mg/dL   Protein Creatinine Ratio        0.00 - 0.15 mg/mg[Cre]  ABO/Rh     Status: None   Collection Time: 03/15/21  1:59 PM  Result Value Ref Range   ABO/RH(D)      O POS Performed at [facility], [address] [place], [place]     Assessment:   40 y.o. G3P1011 [redacted]w[redacted]d   Plan:   1) Labor - continue to titrate pitocin, AROM  clear  2) Fetus - cat I tracing  3) CHTN - BP well controlled  Vena Austria, MD, [facility], [facility] 03/15/2021, 11:15 PM

## 2021-03-15 NOTE — Anesthesia Procedure Notes (Signed)
Epidural Patient location during procedure: OB Start time: 03/15/2021 8:22 PM  Staffing Anesthesiologist: Neldon Mc, MD Performed: anesthesiologist   Epidural Patient position: sitting Prep: Betadine Patient monitoring: heart rate, continuous pulse ox and blood pressure Approach: right paramedian Location: L4-L5 (2 attempts) Injection technique: LOR air  Needle:  Needle type: Tuohy  Needle gauge: 17 G Needle length: 9 cm Needle insertion depth: 7 cm Catheter type: closed end Catheter size: 20 Guage Catheter at skin depth: 12 cm Test dose: negative and 1.5% lidocaine with Epi 1:200 K

## 2021-03-16 ENCOUNTER — Encounter: Payer: Self-pay | Admitting: Obstetrics and Gynecology

## 2021-03-16 DIAGNOSIS — Z3A38 38 weeks gestation of pregnancy: Secondary | ICD-10-CM

## 2021-03-16 DIAGNOSIS — O10013 Pre-existing essential hypertension complicating pregnancy, third trimester: Secondary | ICD-10-CM

## 2021-03-16 LAB — CBC
HCT: 29.9 % — ABNORMAL LOW (ref 36.0–46.0)
Hemoglobin: 10.1 g/dL — ABNORMAL LOW (ref 12.0–15.0)
MCH: 30.1 pg (ref 26.0–34.0)
MCHC: 33.8 g/dL (ref 30.0–36.0)
MCV: 89 fL (ref 80.0–100.0)
Platelets: 220 10*3/uL (ref 150–400)
RBC: 3.36 MIL/uL — ABNORMAL LOW (ref 3.87–5.11)
RDW: 12.4 % (ref 11.5–15.5)
WBC: 19.7 10*3/uL — ABNORMAL HIGH (ref 4.0–10.5)
nRBC: 0 % (ref 0.0–0.2)

## 2021-03-16 LAB — RPR: RPR Ser Ql: NONREACTIVE

## 2021-03-16 MED ORDER — SENNOSIDES-DOCUSATE SODIUM 8.6-50 MG PO TABS: 2.0000 | ORAL_TABLET | ORAL | Status: DC

## 2021-03-16 MED ORDER — WITCH HAZEL-GLYCERIN EX PADS: 1.0000 "application " | MEDICATED_PAD | CUTANEOUS | Status: DC | PRN

## 2021-03-16 MED ORDER — ACETAMINOPHEN 325 MG PO TABS
650.0000 mg | ORAL_TABLET | ORAL | Status: DC | PRN
  Administered 2021-03-17: 650 mg via ORAL
  Filled 2021-03-16 (×2): qty 2

## 2021-03-16 MED ORDER — IBUPROFEN 600 MG PO TABS
600.0000 mg | ORAL_TABLET | Freq: Four times a day (QID) | ORAL | Status: DC
  Administered 2021-03-16 – 2021-03-17 (×5): 600 mg via ORAL
  Filled 2021-03-16 (×5): qty 1

## 2021-03-16 MED ORDER — COCONUT OIL OIL: 1.0000 "application " | TOPICAL_OIL | Status: DC | PRN

## 2021-03-16 MED ORDER — MISOPROSTOL 200 MCG PO TABS: 800.0000 ug | ORAL_TABLET | Freq: Once | ORAL | Status: AC

## 2021-03-16 MED ORDER — ONDANSETRON HCL 4 MG PO TABS: 4.0000 mg | ORAL_TABLET | ORAL | Status: DC | PRN

## 2021-03-16 MED ORDER — PRENATAL MULTIVITAMIN CH
1.0000 | ORAL_TABLET | Freq: Every day | ORAL | Status: DC
  Administered 2021-03-16: 1 via ORAL
  Filled 2021-03-16: qty 1

## 2021-03-16 MED ORDER — SIMETHICONE 80 MG PO CHEW: 80.0000 mg | CHEWABLE_TABLET | ORAL | Status: DC | PRN

## 2021-03-16 MED ORDER — ONDANSETRON HCL 4 MG/2ML IJ SOLN: 4.0000 mg | INTRAMUSCULAR | Status: DC | PRN

## 2021-03-16 MED ORDER — LABETALOL HCL 200 MG PO TABS
200.0000 mg | ORAL_TABLET | Freq: Two times a day (BID) | ORAL | Status: DC
  Administered 2021-03-16 – 2021-03-17 (×3): 200 mg via ORAL
  Filled 2021-03-16 (×3): qty 1

## 2021-03-16 MED ORDER — ALPRAZOLAM 0.5 MG PO TABS
1.0000 mg | ORAL_TABLET | Freq: Three times a day (TID) | ORAL | Status: DC | PRN
  Administered 2021-03-16: 1 mg via ORAL
  Filled 2021-03-16: qty 2

## 2021-03-16 MED ORDER — SENNOSIDES-DOCUSATE SODIUM 8.6-50 MG PO TABS
2.0000 | ORAL_TABLET | ORAL | Status: DC
  Administered 2021-03-16 – 2021-03-17 (×2): 2 via ORAL
  Filled 2021-03-16 (×2): qty 2

## 2021-03-16 MED ORDER — TRANEXAMIC ACID-NACL 1000-0.7 MG/100ML-% IV SOLN
1000.0000 mg | Freq: Once | INTRAVENOUS | Status: AC
  Administered 2021-03-16: 1000 mg via INTRAVENOUS
  Filled 2021-03-16: qty 100

## 2021-03-16 MED ORDER — BENZOCAINE-MENTHOL 20-0.5 % EX AERO: 1.0000 "application " | INHALATION_SPRAY | CUTANEOUS | Status: DC | PRN

## 2021-03-16 MED ORDER — DIPHENHYDRAMINE HCL 25 MG PO CAPS: 25.0000 mg | ORAL_CAPSULE | Freq: Four times a day (QID) | ORAL | Status: DC | PRN

## 2021-03-16 MED ORDER — DIBUCAINE (PERIANAL) 1 % EX OINT: 1.0000 "application " | TOPICAL_OINTMENT | CUTANEOUS | Status: DC | PRN

## 2021-03-16 MED ORDER — OXYCODONE-ACETAMINOPHEN 5-325 MG PO TABS: 2.0000 | ORAL_TABLET | ORAL | Status: DC | PRN

## 2021-03-16 MED ORDER — TRAZODONE HCL 100 MG PO TABS
200.0000 mg | ORAL_TABLET | Freq: Every day | ORAL | Status: DC
  Filled 2021-03-16 (×2): qty 2

## 2021-03-16 MED ORDER — OXYCODONE-ACETAMINOPHEN 5-325 MG PO TABS: 1.0000 | ORAL_TABLET | ORAL | Status: DC | PRN

## 2021-03-16 NOTE — Progress Notes (Addendum)
Pt complains of increasing anxiety and inability to rest/sleep. Stated she has not slept in 2 days and is "beginning to feel weird". Paged provider. Tresea Mall, CNM will place order for PRN anxiety med, pt reports she was previously taking xanax 1mg  TID.   Will plan to cluster care this evening to allow for rest periods, husband to return soon and will stay overnight to assist with baby's care so mom can get some rest.

## 2021-03-16 NOTE — Discharge Summary (Signed)
OB Discharge Summary     Patient Name: Brenda [name] DOB: 11/27/1980 MRN: [id number]  Date of admission: 03/15/2021 Delivering MD: Vena Austria   Date of discharge: 03/17/2021  Admitting diagnosis: Chronic benign essential hypertension, antepartum [O10.019] Indication for care in labor and delivery, antepartum [O75.9] Intrauterine pregnancy: [redacted]w[redacted]d     Secondary diagnosis:  Active Problems:   Chronic benign essential hypertension, antepartum   SVD (spontaneous vaginal delivery)   Postpartum care following vaginal delivery  Additional problems: none     Discharge diagnosis: Term Pregnancy Delivered                                                                                                Post partum procedures: none  Augmentation: AROM, Pitocin, Cytotec, and IP Foley  Complications: None  Hospital course:  Induction of Labor With Vaginal Delivery   40 y.o. yo G3P1011 at [redacted]w[redacted]d was admitted to the hospital 03/15/2021 for induction of labor.  Indication for induction:  chronic hypertension .  Patient had an uncomplicated labor course as follows: Membrane Rupture Time/Date: 11:00 PM ,03/15/2021   Delivery Method:Vaginal, Spontaneous  Episiotomy: None  Lacerations:  2nd degree  Details of delivery can be found in separate delivery note.  Patient had a routine postpartum course. Patient is discharged home 03/17/21.  Newborn Data: Birth date:03/16/2021  Birth time:2:08 AM  Gender:Female  Living status:Living  Apgars:8 ,9  Weight:3110 g   Physical exam  Vitals:   03/16/21 1544 03/16/21 1942 03/17/21 0412 03/17/21 0751  BP:  137/78 (!) 103/59 100/60  Pulse:  98 71 71  Resp:   20 18  Temp: 97.9 F (36.6 C) 98 F (36.7 C) 98 F (36.7 C) 98.6 F (37 C)  TempSrc: Oral Oral Oral Oral  SpO2:  96% 98% 97%  Weight:      Height:       General: alert, cooperative, and no distress Lochia: appropriate Uterine Fundus: firm Incision: N/A DVT Evaluation: No evidence  of DVT seen on physical exam. Negative Homan's sign. Labs: Lab Results  Component Value Date   WBC 19.7 (H) 03/16/2021   HGB 10.1 (L) 03/16/2021   HCT 29.9 (L) 03/16/2021   MCV 89.0 03/16/2021   PLT 220 03/16/2021   CMP Latest Ref Rng & Units 03/15/2021  Glucose 70 - 99 mg/dL 93  BUN 6 - 20 mg/dL 9  Creatinine 5.17 - 6.16 mg/dL 0.73  Sodium 710 - 626 mmol/L 135  Potassium 3.5 - 5.1 mmol/L 3.9  Chloride 98 - 111 mmol/L 104  CO2 22 - 32 mmol/L 22  Calcium 8.9 - 10.3 mg/dL 9.2  Total Protein 6.5 - 8.1 g/dL 6.6  Total Bilirubin 0.3 - 1.2 mg/dL 0.5  Alkaline Phos 38 - 126 U/L 84  AST 15 - 41 U/L 18  ALT 0 - 44 U/L 13    Discharge instruction: per After Visit Summary and "Baby and Me Booklet".  After visit meds:  Allergies as of 03/17/2021       Reactions   Ace Inhibitors Swelling   Lips/ face  Medication List     STOP taking these medications    aspirin 81 MG chewable tablet       TAKE these medications    ALPRAZolam 1 MG tablet Commonly known as: XANAX Take 1 mg by mouth 3 (three) times daily as needed.   amphetamine-dextroamphetamine 20 MG tablet Commonly known as: ADDERALL Take 20 mg by mouth 2 (two) times daily.   amphetamine-dextroamphetamine 20 MG 24 hr capsule Commonly known as: ADDERALL XR Take 20 mg by mouth every morning.   hydrOXYzine 25 MG tablet Commonly known as: ATARAX/VISTARIL Take 1 tablet (25 mg total) by mouth every 8 (eight) hours as needed for anxiety.   ibuprofen 600 MG tablet Commonly known as: ADVIL Take 1 tablet (600 mg total) by mouth every 6 (six) hours.   labetalol 200 MG tablet Commonly known as: NORMODYNE TAKE 1 TABLET BY MOUTH TWICE A DAY   pantoprazole 40 MG tablet Commonly known as: PROTONIX TAKE 1 TABLET BY MOUTH EVERY DAY   promethazine 25 MG tablet Commonly known as: PHENERGAN Take 1 tablet (25 mg total) by mouth every 12 (twelve) hours as needed for nausea or vomiting.   traZODone 100 MG  tablet Commonly known as: DESYREL Take 200 mg by mouth at bedtime.        Diet: routine diet  Activity: Advance as tolerated. Pelvic rest for 6 weeks.   Outpatient follow up:1 week Follow up Appt: Future Appointments  Date Time Provider Department Center  03/22/2021  1:50 PM Vena Austria, MD [facility] None  04/06/2021  9:00 AM [facility] 1 [facility]   Follow up Visit:No follow-ups on file.  Postpartum contraception: Tubal Ligation or husband may opt for vasectomy  Newborn Data: Live born female  Birth Weight:   APGAR: ,   Newborn Delivery   Birth date/time: 03/16/2021 02:08:00 Delivery type: Vaginal, Spontaneous      Baby Feeding: Bottle Disposition:home with mother   03/17/2021 Mirna Mires, CNM

## 2021-03-16 NOTE — Progress Notes (Signed)
Subjective:  Doing well postpartum day 1; she is tolerating regular diet, her pain is controlled with PO medications, she is ambulating and voiding without difficulty. She had not yet had her BP medication before vitals checked this morning. She denies any s/s associated with elevated BP.  Objective:  Vital signs in last 24 hours: Temp:  [98 F (36.7 C)-99.3 F (37.4 C)] 98.3 F (36.8 C) (09/21 0757) Pulse Rate:  [70-122] 77 (09/21 0757) Resp:  [16-20] 19 (09/21 0757) BP: (106-150)/(43-100) 143/79 (09/21 0757) SpO2:  [90 %-98 %] 96 % (09/21 0757) Weight:  [111.6 kg] 111.6 kg (09/20 1307)    General: NAD Pulmonary: no increased work of breathing Abdomen: non-distended, non-tender, fundus firm at level of umbilicus Extremities: no edema, no erythema, no tenderness  Results for orders placed or performed during the hospital encounter of 03/15/21 (from the past 72 hour(s))  Resp Panel by RT-PCR (Flu A&B, Covid) Nasopharyngeal Swab     Status: None   Collection Time: 03/15/21  1:16 PM   Specimen: Nasopharyngeal Swab; Nasopharyngeal(NP) swabs in vial transport medium  Result Value Ref Range   SARS Coronavirus 2 by RT PCR NEGATIVE NEGATIVE    Comment: (NOTE) SARS-CoV-2 target nucleic acids are NOT DETECTED.  The SARS-CoV-2 RNA is generally detectable in upper respiratory specimens during the acute phase of infection. The lowest concentration of SARS-CoV-2 viral copies this assay can detect is 138 copies/mL. A negative result does not preclude SARS-Cov-2 infection and should not be used as the sole basis for treatment or other patient management decisions. A negative result may occur with  improper specimen collection/handling, submission of specimen other than nasopharyngeal swab, presence of viral mutation(s) within the areas targeted by this assay, and inadequate number of viral copies(<138 copies/mL). A negative result must be combined with clinical observations, patient  history, and epidemiological information. The expected result is Negative.  Fact Sheet for Patients:  BloggerCourse.com  Fact Sheet for Healthcare Providers:  SeriousBroker.it  This test is no t yet approved or cleared by the [place] FDA and  has been authorized for detection and/or diagnosis of SARS-CoV-2 by FDA under an Emergency Use Authorization (EUA). This EUA will remain  in effect (meaning this test can be used) for the duration of the COVID-19 declaration under Section 564(b)(1) of the Act, 21 U.S.C.section 360bbb-3(b)(1), unless the authorization is terminated  or revoked sooner.       Influenza A by PCR NEGATIVE NEGATIVE   Influenza B by PCR NEGATIVE NEGATIVE    Comment: (NOTE) The Xpert Xpress SARS-CoV-2/FLU/RSV plus assay is intended as an aid in the diagnosis of influenza from Nasopharyngeal swab specimens and should not be used as a sole basis for treatment. Nasal washings and aspirates are unacceptable for Xpert Xpress SARS-CoV-2/FLU/RSV testing.  Fact Sheet for Patients: BloggerCourse.com  Fact Sheet for Healthcare Providers: SeriousBroker.it  This test is not yet approved or cleared by the [place] FDA and has been authorized for detection and/or diagnosis of SARS-CoV-2 by FDA under an Emergency Use Authorization (EUA). This EUA will remain in effect (meaning this test can be used) for the duration of the COVID-19 declaration under Section 564(b)(1) of the Act, 21 U.S.C. section 360bbb-3(b)(1), unless the authorization is terminated or revoked.  Performed at [facility], [address] [place], [place] [address]   CBC     Status: Abnormal   Collection Time: 03/15/21  1:16 PM  Result Value Ref Range   WBC 13.8 (H) 4.0 -  10.5 K/uL   RBC 4.04 3.87 - 5.11 MIL/uL   Hemoglobin 12.2 12.0 - 15.0 g/dL   HCT 32.4 (L) 40.1 - 02.7 %   MCV  88.4 80.0 - 100.0 fL   MCH 30.2 26.0 - 34.0 pg   MCHC 34.2 30.0 - 36.0 g/dL   RDW 25.3 66.4 - 40.3 %   Platelets 269 150 - 400 K/uL   nRBC 0.0 0.0 - 0.2 %    Comment: Performed at [facility], [address] [place], [place] [address]  Type and screen     Status: None   Collection Time: 03/15/21  1:16 PM  Result Value Ref Range   ABO/RH(D) O POS    Antibody Screen NEG    Sample Expiration      03/18/2021,2359 Performed at [facility], [address] [place], [place] [address]   Comprehensive metabolic panel     Status: Abnormal   Collection Time: 03/15/21  1:16 PM  Result Value Ref Range   Sodium 135 135 - 145 mmol/L   Potassium 3.9 3.5 - 5.1 mmol/L   Chloride 104 98 - 111 mmol/L   CO2 22 22 - 32 mmol/L   Glucose, Bld 93 70 - 99 mg/dL    Comment: Glucose reference range applies only to samples taken after fasting for at least 8 hours.   BUN 9 6 - 20 mg/dL   Creatinine, Ser 7.56 0.44 - 1.00 mg/dL   Calcium 9.2 8.9 - 43.3 mg/dL   Total Protein 6.6 6.5 - 8.1 g/dL   Albumin 2.9 (L) 3.5 - 5.0 g/dL   AST 18 15 - 41 U/L   ALT 13 0 - 44 U/L   Alkaline Phosphatase 84 38 - 126 U/L   Total Bilirubin 0.5 0.3 - 1.2 mg/dL   GFR, Estimated >29 >51 mL/min    Comment: (NOTE) Calculated using the CKD-EPI Creatinine Equation (2021)    Anion gap 9 5 - 15    Comment: Performed at [facility], [address], [place], [place] [address]  Protein / creatinine ratio, urine     Status: None   Collection Time: 03/15/21  1:16 PM  Result Value Ref Range   Creatinine, Urine 36 mg/dL   Total Protein, Urine <6 mg/dL    Comment: NO NORMAL RANGE ESTABLISHED FOR THIS TEST   Protein Creatinine Ratio        0.00 - 0.15 mg/mg[Cre]    Comment: RESULT BELOW REPORTABLE RANGE, UNABLE TO CALCULATE. Performed at [facility], [address] [place], [place] [address]   ABO/Rh     Status: None   Collection Time: 03/15/21  1:59 PM  Result Value Ref Range    ABO/RH(D)      O POS Performed at [facility], [address] [place], [place] [address]   CBC     Status: Abnormal   Collection Time: 03/16/21  5:55 AM  Result Value Ref Range   WBC 19.7 (H) 4.0 - 10.5 K/uL   RBC 3.36 (L) 3.87 - 5.11 MIL/uL   Hemoglobin 10.1 (L) 12.0 - 15.0 g/dL   HCT 93.2 (L) 35.5 - 73.2 %   MCV 89.0 80.0 - 100.0 fL   MCH 30.1 26.0 - 34.0 pg   MCHC 33.8 30.0 - 36.0 g/dL   RDW 20.2 54.2 - 70.6 %   Platelets 220 150 - 400 K/uL   nRBC 0.0 0.0 - 0.2 %    Comment: Performed at [facility], [address] [place], [place] [address]    Assessment:   40 y.o. [phone] postpartum day # 1  Plan:    1) Acute blood loss anemia - hemodynamically stable and asymptomatic - po ferrous sulfate  2) Blood Type --/--/O POS Performed at [facility], [address] [place], [place] [address]  [phone]/20 1359) / Ishmael Holter 1.64 (02/10 1202) / Varicella Immune  3) TDAP status up to date  4) Feeding plan  Formula  5)  Education given regarding options for contraception, as well as compatibility with breast feeding if applicable.  Patient plans on oral progesterone-only contraceptive for contraception and she plans for interval bilateral tubal ligation.  6) CHTN: continue Labetalol 200 mg BID  7) Disposition: continue current care   Tresea Mall, CNM [facility] 03/16/2021, 10:07 AM

## 2021-03-16 NOTE — Anesthesia Postprocedure Evaluation (Signed)
Anesthesia Post Note  Patient: Brenda [name]  Procedure(s) Performed: AN AD HOC LABOR EPIDURAL  Patient location during evaluation: Mother Baby Anesthesia Type: Epidural Level of consciousness: oriented and awake and alert Pain management: pain level controlled Vital Signs Assessment: post-procedure vital signs reviewed and stable Respiratory status: spontaneous breathing and respiratory function stable Cardiovascular status: blood pressure returned to baseline and stable Postop Assessment: no headache, no backache, no apparent nausea or vomiting and able to ambulate Anesthetic complications: no   No notable events documented.   Last Vitals:  Vitals:   03/16/21 0538 03/16/21 0757  BP: (!) 146/74 (!) 143/79  Pulse: (!) 104 77  Resp: 20 19  Temp: 36.8 C 36.8 C  SpO2: 98% 96%    Last Pain:  Vitals:   03/16/21 0830  TempSrc:   PainSc: 0-No pain                 Malachi Pro C

## 2021-03-16 NOTE — Progress Notes (Signed)
Pt will small constant trickle of blood. Dr Bonney Aid in to assess. Cytotec and TXA ordered and gvien

## 2021-03-17 MED ORDER — IBUPROFEN 600 MG PO TABS: 600.0000 mg | ORAL_TABLET | Freq: Four times a day (QID) | ORAL | 0 refills | Status: DC

## 2021-03-17 NOTE — Progress Notes (Signed)
Pt discharged with infant.  Discharge instructions, prescriptions and follow up appointment given to and reviewed with pt. Pt verbalized understanding. Escorted out by auxillary. 

## 2021-03-22 ENCOUNTER — Other Ambulatory Visit: Payer: Self-pay

## 2021-03-22 ENCOUNTER — Ambulatory Visit (INDEPENDENT_AMBULATORY_CARE_PROVIDER_SITE_OTHER): Payer: BC Managed Care – PPO | Admitting: Obstetrics and Gynecology

## 2021-03-22 ENCOUNTER — Encounter: Payer: Self-pay | Admitting: Obstetrics and Gynecology

## 2021-03-22 VITALS — BP 144/86 | Ht 64.0 in | Wt 236.0 lb

## 2021-03-22 DIAGNOSIS — Z0130 Encounter for examination of blood pressure without abnormal findings: Secondary | ICD-10-CM

## 2021-03-22 DIAGNOSIS — F411 Generalized anxiety disorder: Secondary | ICD-10-CM

## 2021-03-22 DIAGNOSIS — G4700 Insomnia, unspecified: Secondary | ICD-10-CM

## 2021-03-22 MED ORDER — CLONAZEPAM 0.5 MG PO TABS: 0.5000 mg | ORAL_TABLET | Freq: Two times a day (BID) | ORAL | 0 refills | Status: DC | PRN

## 2021-03-22 NOTE — Progress Notes (Signed)
Obstetrics & Gynecology Office Visit   Chief Complaint:  Chief Complaint  Patient presents with   Post-op Follow-up    1 wk postpartum - insomnia    History of Present Illness: 40 y.o. G2X5284 being seen for follow up blood pressure check today.  The patient is  postpartum. The established diagnosis for the patient is chronic hypertension.  She is currently on Labetalol 200mg  po bid..  She reports no current symptoms attributable to her blood pressure.  Medication list reviewed medications which may contribute to BP elevation were not noted and no medications contraindicated for use in patient with current hypertension were noted.  Patient does report some increased anxiety postpartum as well insomnia.  She does have a history of manic episode several years ago and was started on seroquel at that time.  Review of Systems: Review of Systems  Constitutional: Negative.   Gastrointestinal: Negative.   Genitourinary: Negative.   Neurological: Negative.   Psychiatric/Behavioral:  Negative for depression, hallucinations, memory loss, substance abuse and suicidal ideas. The patient is nervous/anxious and has insomnia.     Past Medical History:  Past Medical History:  Diagnosis Date   ADHD, adult residual type    Anxiety    Depression    GERD (gastroesophageal reflux disease)    Hypertension    Wears contact lenses     Past Surgical History:  Past Surgical History:  Procedure Laterality Date   BREAST REDUCTION SURGERY     COLONOSCOPY WITH PROPOFOL N/A 11/09/2016   Procedure: COLONOSCOPY WITH PROPOFOL;  Surgeon: 11/11/2016, MD;  Location: [facility];  Service: Endoscopy;  Laterality: N/A;   ESOPHAGOGASTRODUODENOSCOPY (EGD) WITH PROPOFOL N/A 11/09/2016   Procedure: ESOPHAGOGASTRODUODENOSCOPY (EGD) WITH PROPOFOL;  Surgeon: 11/11/2016, MD;  Location: [facility];  Service: Endoscopy;  Laterality: N/A;   TONSILLECTOMY      Gynecologic History: No LMP  recorded.  Obstetric History: [facility]  Family History:  Family History  Problem Relation Age of Onset   Heart disease Maternal Grandmother    Heart disease Maternal Grandfather    Diabetes Paternal Grandfather    Hypertension Mother    Hypertension Father     Social History:  Social History   Socioeconomic History   Marital status: Married    Spouse name: Lewis   Number of children: Not on file   Years of education: Not on file   Highest education level: Not on file  Occupational History   Occupation: [id number]   Tobacco Use   Smoking status: Never   Smokeless tobacco: Never  Vaping Use   Vaping Use: Never used  Substance and Sexual Activity   Alcohol use: Not Currently    Alcohol/week: 10.0 standard drinks    Types: 5 Glasses of wine, 5 Cans of beer per week   Drug use: No   Sexual activity: Yes    Birth control/protection: None  Other Topics Concern   Not on file  Social History Narrative   Not on file   Social Determinants of Health   Financial Resource Strain: Not on file  Food Insecurity: Not on file  Transportation Needs: Not on file  Physical Activity: Not on file  Stress: Not on file  Social Connections: Not on file  Intimate Partner Violence: Not on file    Allergies:  Allergies  Allergen Reactions   Ace Inhibitors Swelling    Lips/ face    Medications: Prior to Admission medications   Medication Sig Start Date  End Date Taking? Authorizing Provider  ALPRAZolam Prudy Feeler) 1 MG tablet Take 1 mg by mouth 3 (three) times daily as needed. Patient not taking: No sig reported 02/21/21   [provider]  amphetamine-dextroamphetamine (ADDERALL XR) 20 MG 24 hr capsule Take 20 mg by mouth every morning. Patient not taking: No sig reported 02/01/21   [provider]  amphetamine-dextroamphetamine (ADDERALL) 20 MG tablet Take 20 mg by mouth 2 (two) times daily. Patient not taking: No sig reported    [provider]  hydrOXYzine  (ATARAX/VISTARIL) 25 MG tablet Take 1 tablet (25 mg total) by mouth every 8 (eight) hours as needed for anxiety. 12/15/20   Vena Austria, MD  ibuprofen (ADVIL) 600 MG tablet Take 1 tablet (600 mg total) by mouth every 6 (six) hours. 03/17/21   Mirna Mires, CNM  labetalol (NORMODYNE) 200 MG tablet TAKE 1 TABLET BY MOUTH TWICE A DAY 11/30/20   Vena Austria, MD  pantoprazole (PROTONIX) 40 MG tablet TAKE 1 TABLET BY MOUTH EVERY DAY 02/18/21   Mirna Mires, CNM  promethazine (PHENERGAN) 25 MG tablet Take 1 tablet (25 mg total) by mouth every 12 (twelve) hours as needed for nausea or vomiting. 02/24/21   Vena Austria, MD  traZODone (DESYREL) 100 MG tablet Take 200 mg by mouth at bedtime. 07/17/20   [provider]    Physical Exam Blood pressure (!) 144/86, height 5\' 4"  (1.626 m), weight 236 lb (107 kg), currently breastfeeding.  No LMP recorded.  General: NAD HEENT: normocephalic, anicteric Pulmonary: No increased work of breathing  Neurologic: Grossly intact Psychiatric: mood appropriate, affect full  Assessment: 40 y.o. 24 presenting for blood pressure evaluation today  Plan: Problem List Items Addressed This Visit   None Visit Diagnoses     BP check    -  Primary       1) Blood pressure - blood pressure at today's visit is normotensive.  As a result  no adjustments made . - additional blood work was not obtained  2) Trial of Slynd for contraception postpartum  3) Insomnia / Anxiety - trial of vistaril.   If no improvement discussed psychiatry referral given concern for exacerbating manic episode with SSRI like Zoloft  [id number], MD, Vena Austria OB/GYN, [facility] 03/22/2021, 2:02 PM

## 2021-03-29 ENCOUNTER — Other Ambulatory Visit: Payer: Self-pay | Admitting: Obstetrics and Gynecology

## 2021-04-04 ENCOUNTER — Other Ambulatory Visit: Payer: Self-pay | Admitting: *Deleted

## 2021-04-04 ENCOUNTER — Telehealth (HOSPITAL_COMMUNITY): Payer: Self-pay | Admitting: Emergency Medicine

## 2021-04-04 DIAGNOSIS — I472 Ventricular tachycardia, unspecified: Secondary | ICD-10-CM

## 2021-04-04 NOTE — Telephone Encounter (Signed)
Attempted to call patient regarding upcoming cardiac MR appointment. Left message on voicemail with name and callback number Serenah Mill RN Navigator Cardiac Imaging [facility] [phone] Office [phone] Cell  

## 2021-04-04 NOTE — Telephone Encounter (Signed)
Pt returning phone call regarding upcoming cardiac imaging study; pt verbalizes understanding of appt date/time, parking situation and where to check in, and verified current allergies; name and call back number provided for further questions should they arise Rockwell Alexandria RN Navigator Cardiac Imaging [facility] [phone] office [phone] cell  States shes not breastfeeding Taking xanax prior to scan with designated driver Denies implants/metal

## 2021-04-06 ENCOUNTER — Ambulatory Visit (HOSPITAL_COMMUNITY)
Admission: RE | Admit: 2021-04-06 | Discharge: 2021-04-06 | Disposition: A | Discharge status: Home / Self Care | Payer: BC Managed Care – PPO | Source: Ambulatory Visit | Attending: Internal Medicine | Admitting: Internal Medicine

## 2021-04-06 ENCOUNTER — Other Ambulatory Visit: Payer: Self-pay

## 2021-04-06 DIAGNOSIS — I472 Ventricular tachycardia, unspecified: Secondary | ICD-10-CM | POA: Diagnosis not present

## 2021-04-06 IMAGING — MR MR CARD MORPHOLOGY WO/W CM
45 of 48 series · 45 of 48 positions shown · IV contrast (gadavist)
Comparison: none

CLINICAL DATA: Hx of VT, evaluate for ARVC, scar

EXAM:
CARDIAC MRI
TECHNIQUE: The patient was scanned on a 1.5 Tesla Siemens magnet. A dedicated
cardiac coil was used. Functional imaging was done using Fiesta
sequences. [DATE], and 4 chamber views were done to assess for RWMA's.
Modified RIGBY rule using a short axis stack was used to
calculate an ejection fraction on a dedicated work station using
Circle software. The patient received 10 cc of Gadavist. After 10
minutes inversion recovery sequences were used to assess for
infiltration and scar tissue.
CONTRAST:  10 cc  of Gadavist

[Series 4: [id number] · axial · 8.0mm · 1.41mm/px · 1 of 16 slices shown]
[im 1/16]
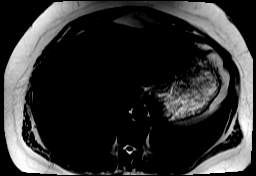

[Series 8: bSSFP · oblique · 8.0mm · 1.61mm/px · 1 of 25 slices shown (1 of 44)]
[im 1/25]
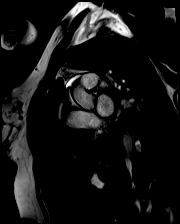

[Series 9: bSSFP · oblique · 8.0mm · 1.61mm/px · 1 of 25 slices shown (2 of 44)]
[im 1/25]
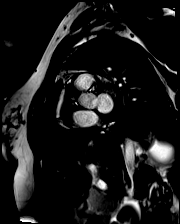

[Series 10: bSSFP · oblique · 8.0mm · 1.61mm/px · 1 of 25 slices shown (3 of 44)]
[im 1/25]
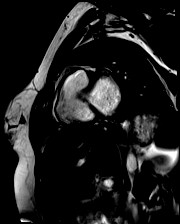

[Series 11: bSSFP · oblique · 8.0mm · 1.61mm/px · 1 of 25 slices shown (4 of 44)]
[im 1/25]
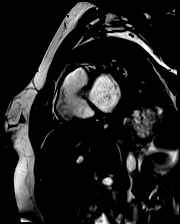

[Series 12: bSSFP · oblique · 8.0mm · 1.61mm/px · 1 of 25 slices shown (5 of 44)]
[im 1/25]
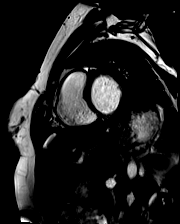

[Series 13: bSSFP · oblique · 8.0mm · 1.61mm/px · 1 of 18 slices shown (6 of 44)]
[im 1/18]
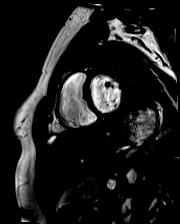

[Series 14: bSSFP · oblique · 8.0mm · 1.61mm/px · 1 of 18 slices shown (7 of 44)]
[im 1/18]
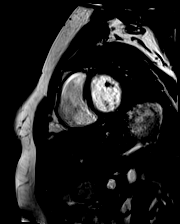

[Series 15: bSSFP · oblique · 8.0mm · 1.61mm/px · 1 of 25 slices shown (8 of 44)]
[im 1/25]
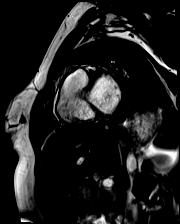

[Series 16: bSSFP · oblique · 8.0mm · 1.61mm/px · 1 of 25 slices shown (9 of 44)]
[im 1/25]
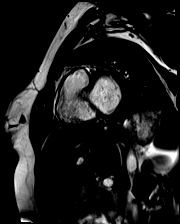

[Series 17: bSSFP · oblique · 8.0mm · 1.61mm/px · 1 of 25 slices shown (10 of 44)]
[im 1/25]
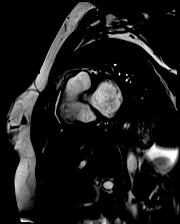

[Series 18: bSSFP · oblique · 8.0mm · 1.61mm/px · 1 of 25 slices shown (11 of 44)]
[im 1/25]
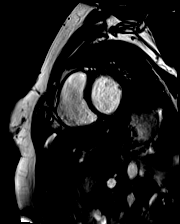

[Series 19: bSSFP · oblique · 8.0mm · 1.61mm/px · 1 of 25 slices shown (12 of 44)]
[im 1/25]
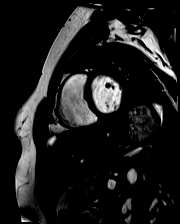

[Series 20: bSSFP · oblique · 8.0mm · 1.61mm/px · 1 of 25 slices shown (13 of 44)]
[im 1/25]
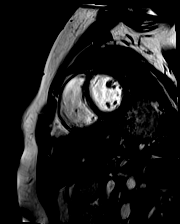

[Series 21: bSSFP · oblique · 8.0mm · 1.61mm/px · 1 of 25 slices shown (14 of 44)]
[im 1/25]
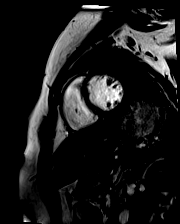

[Series 22: bSSFP · oblique · 8.0mm · 1.61mm/px · 1 of 25 slices shown (15 of 44)]
[im 1/25]
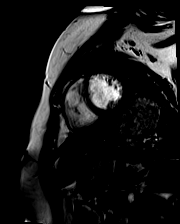

[Series 23: bSSFP · oblique · 8.0mm · 1.61mm/px · 1 of 25 slices shown (16 of 44)]
[im 1/25]
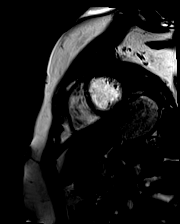

[Series 24: bSSFP · oblique · 8.0mm · 1.61mm/px · 1 of 25 slices shown (17 of 44)]
[im 1/25]
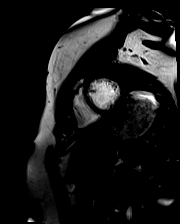

[Series 25: bSSFP · oblique · 8.0mm · 1.61mm/px · 1 of 25 slices shown (18 of 44)]
[im 1/25]
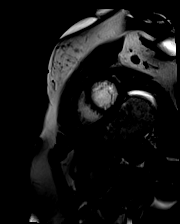

[Series 26: bSSFP · oblique · 8.0mm · 1.61mm/px · 1 of 25 slices shown (19 of 44)]
[im 1/25]
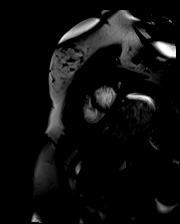

[Series 27: bSSFP · oblique · 8.0mm · 1.61mm/px · 1 of 25 slices shown (20 of 44)]
[im 1/25]
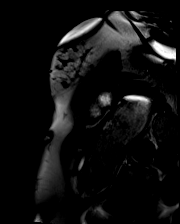

[Series 28: bSSFP · oblique · 8.0mm · 1.61mm/px · 1 of 25 slices shown (21 of 44)]
[im 1/25]
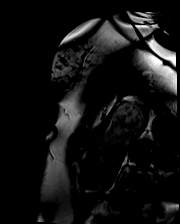

[Series 29: bSSFP · oblique · 8.0mm · 1.61mm/px · 1 of 25 slices shown (22 of 44)]
[im 1/25]
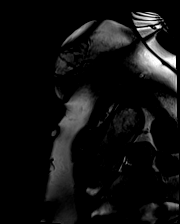

[Series 30: bSSFP · oblique · 6.0mm · 1.41mm/px · 1 of 25 slices shown (23 of 44)]
[im 1/25]
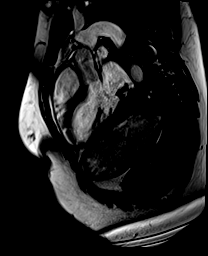

[Series 31: bSSFP · coronal · 6.0mm · 1.41mm/px · 1 of 25 slices shown (24 of 44)]
[im 1/25]
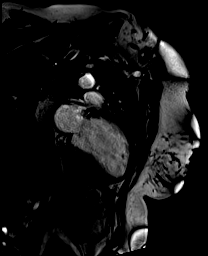

[Series 32: bSSFP · axial · 6.0mm · 1.41mm/px · 1 of 25 slices shown (25 of 44)]
[im 1/25]
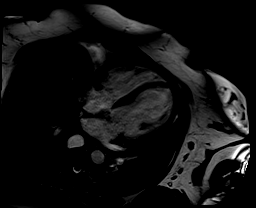

[Series 33: bSSFP · oblique · 6.0mm · 1.41mm/px · 1 of 25 slices shown (26 of 44)]
[im 1/25]
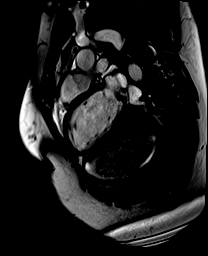

[Series 34: bSSFP · coronal · 6.0mm · 1.41mm/px · 1 of 25 slices shown (27 of 44)]
[im 1/25]
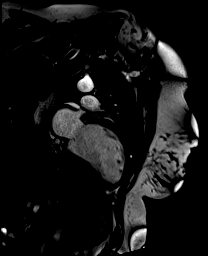

[Series 35: bSSFP · axial · 6.0mm · 1.41mm/px · 1 of 25 slices shown (28 of 44)]
[im 1/25]
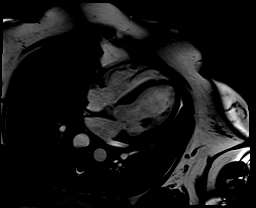

[Series 36: bSSFP · oblique · 6.0mm · 1.41mm/px · 1 of 25 slices shown (29 of 44)]
[im 1/25]
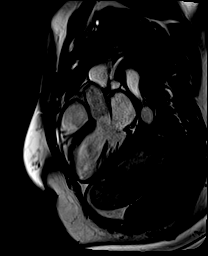

[Series 37: bSSFP · oblique · 8.0mm · 1.61mm/px · 1 of 25 slices shown (30 of 44)]
[im 1/25]
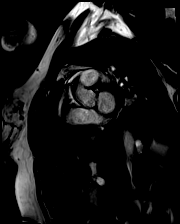

[Series 38: bSSFP · oblique · 8.0mm · 1.61mm/px · 1 of 25 slices shown (31 of 44)]
[im 1/25]
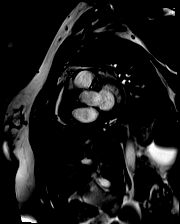

[Series 39: bSSFP · oblique · 8.0mm · 1.61mm/px · 1 of 25 slices shown (32 of 44)]
[im 1/25]
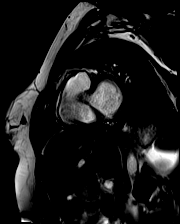

[Series 40: bSSFP · oblique · 8.0mm · 1.61mm/px · 1 of 25 slices shown (33 of 44)]
[im 1/25]
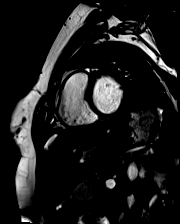

[Series 41: bSSFP · oblique · 8.0mm · 1.61mm/px · 1 of 25 slices shown (34 of 44)]
[im 1/25]
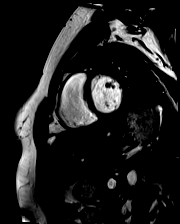

[Series 42: bSSFP · oblique · 8.0mm · 1.61mm/px · 1 of 25 slices shown (35 of 44)]
[im 1/25]
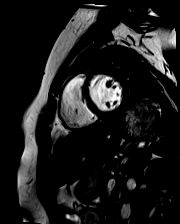

[Series 43: bSSFP · oblique · 8.0mm · 1.61mm/px · 1 of 25 slices shown (36 of 44)]
[im 1/25]
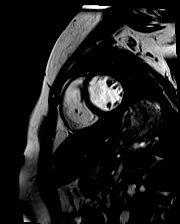

[Series 44: bSSFP · oblique · 8.0mm · 1.61mm/px · 1 of 25 slices shown (37 of 44)]
[im 1/25]
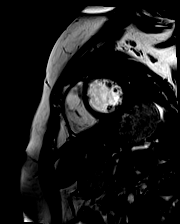

[Series 45: bSSFP · oblique · 8.0mm · 1.61mm/px · 1 of 25 slices shown (38 of 44)]
[im 1/25]
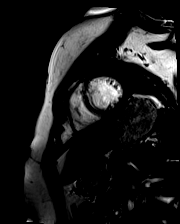

[Series 46: bSSFP · oblique · 8.0mm · 1.61mm/px · 1 of 25 slices shown (39 of 44)]
[im 1/25]
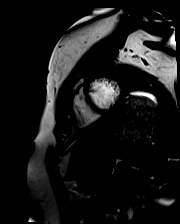

[Series 47: bSSFP · oblique · 8.0mm · 1.61mm/px · 1 of 25 slices shown (40 of 44)]
[im 1/25]
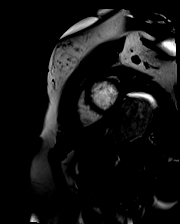

[Series 48: bSSFP · oblique · 8.0mm · 1.61mm/px · 1 of 25 slices shown (41 of 44)]
[im 1/25]
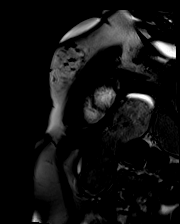

[Series 49: bSSFP · oblique · 8.0mm · 1.61mm/px · 1 of 25 slices shown (42 of 44)]
[im 1/25]
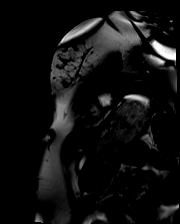

[Series 50: bSSFP · oblique · 8.0mm · 1.61mm/px · 1 of 25 slices shown (43 of 44)]
[im 1/25]
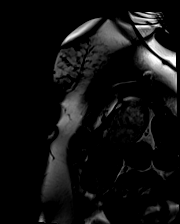

[Series 51: bSSFP · oblique · 8.0mm · 1.61mm/px · 1 of 25 slices shown (44 of 44)]
[im 1/25]
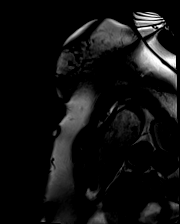

[45 of 48 positions shown; findings below may reference images not displayed]

FINDINGS: 1. Normal left ventricular size, thickness and systolic function
(LVEF = 58%). There are no regional wall motion abnormalities.

There is no late gadolinium enhancement in the left ventricular
myocardium.

LVEDV: 189 ml

LVESV: 80 ml

SV: 109 ml

Myocardial mass: 94.2 g

2. Normal right ventricular size, thickness and systolic function
(RVEF = 54%). There are no regional wall motion abnormalities. There
is no late gadolinium enhancement in the right ventricular
myocardium

3.  Normal left and right atrial size.

4. Normal size of the aortic root, ascending aorta and pulmonary
artery.

5.  No significant valvular abnormalities.

6.  Normal pericardium.  No pericardial effusion.
IMPRESSION: 1.  Normal LV and RV size and function, LVEF 58%

2. There is no late gadolinium enhancement in the right or left
ventricular myocardium

3.  There is no wall motion abnormalities in the LV or RV.

4.  There is no evidence for ARVC or other cardiomyopathy.

5.  Normal cardiac MRI

RIGBY

## 2021-04-06 MED ORDER — GADOBUTROL 1 MMOL/ML IV SOLN
10.0000 mL | Freq: Once | INTRAVENOUS | Status: AC | PRN
  Administered 2021-04-06: 10 mL via INTRAVENOUS

## 2021-04-13 ENCOUNTER — Telehealth: Payer: Self-pay

## 2021-04-13 NOTE — Telephone Encounter (Signed)
The patient has been notified of the result and verbalized understanding.  All questions (if any) were answered. Margrett Rud, [place] 04/13/2021 2:30 PM

## 2021-04-13 NOTE — Telephone Encounter (Signed)
Margrett Rud, [place]  04/13/2021 12:46 PM EDT Back to Top    Attempted to contact patient to review MRI resutls.  No answer. LMOV.  Phone note started.      Duke Salvia, MD  Jefferey Pica, RN; Alois Cliche, RN Please Inform Patient that study was normal  This is great news  No changes   Thanks

## 2021-04-28 ENCOUNTER — Encounter: Payer: Self-pay | Admitting: Obstetrics and Gynecology

## 2021-04-28 ENCOUNTER — Ambulatory Visit (INDEPENDENT_AMBULATORY_CARE_PROVIDER_SITE_OTHER): Payer: BC Managed Care – PPO | Admitting: Obstetrics and Gynecology

## 2021-04-28 ENCOUNTER — Other Ambulatory Visit: Payer: Self-pay

## 2021-04-28 DIAGNOSIS — F3111 Bipolar disorder, current episode manic without psychotic features, mild: Secondary | ICD-10-CM

## 2021-04-28 DIAGNOSIS — Z0130 Encounter for examination of blood pressure without abnormal findings: Secondary | ICD-10-CM

## 2021-04-28 DIAGNOSIS — K644 Residual hemorrhoidal skin tags: Secondary | ICD-10-CM

## 2021-04-28 MED ORDER — LABETALOL HCL 200 MG PO TABS: 400.0000 mg | ORAL_TABLET | Freq: Two times a day (BID) | ORAL | 11 refills | Status: DC

## 2021-04-28 MED ORDER — NORETHINDRONE 0.35 MG PO TABS: 1.0000 | ORAL_TABLET | Freq: Every day | ORAL | 11 refills | Status: DC

## 2021-04-28 MED ORDER — CLONAZEPAM 0.5 MG PO TABS: 0.5000 mg | ORAL_TABLET | Freq: Two times a day (BID) | ORAL | 0 refills | Status: DC | PRN

## 2021-04-28 MED ORDER — HYDROCORTISONE ACETATE 25 MG RE SUPP: 25.0000 mg | Freq: Two times a day (BID) | RECTAL | 1 refills | Status: DC

## 2021-04-28 NOTE — Progress Notes (Signed)
Postpartum Visit  Chief Complaint: No chief complaint on file.   History of Present Illness: Patient is a 40 y.o. [id number] presents for postpartum visit.  Date of delivery: .03/16/21 Type of delivery: Vaginal delivery - Vacuum or forceps assisted  no Episiotomy No.  Laceration: yes 2nd degree Pregnancy or labor problems:  small postpartum hemorrhage cytotec and TXA, chronic hypertension Any problems since the delivery:  no  Newborn Details:  SINGLETON :  1. BabyGender female. Birth weight:   Maternal Details:  Breast or formula feeding: plans to breastfeed Contraception after delivery: Yes  Any bowel or bladder issues: No  Post partum depression/anxiety noted:  yes Edinburgh Post-Partum Depression Score:16 Date of last PAP: 2/10/2022ASCUS with NEGATIVE high risk HPV     Review of Systems: ROS  The following portions of the patient's history were reviewed and updated as appropriate: allergies, current medications, past family history, past medical history, past social history, past surgical history, and problem list.  Past Medical History:  Past Medical History:  Diagnosis Date   ADHD, adult residual type    Anxiety    Depression    GERD (gastroesophageal reflux disease)    Hypertension    Wears contact lenses     Past Surgical History:  Past Surgical History:  Procedure Laterality Date   BREAST REDUCTION SURGERY     COLONOSCOPY WITH PROPOFOL N/A 11/09/2016   Procedure: COLONOSCOPY WITH PROPOFOL;  Surgeon: Lucilla Lame, MD;  Location: [place];  Service: Endoscopy;  Laterality: N/A;   ESOPHAGOGASTRODUODENOSCOPY (EGD) WITH PROPOFOL N/A 11/09/2016   Procedure: ESOPHAGOGASTRODUODENOSCOPY (EGD) WITH PROPOFOL;  Surgeon: Lucilla Lame, MD;  Location: [place];  Service: Endoscopy;  Laterality: N/A;   TONSILLECTOMY      Family History:  Family History  Problem Relation Age of Onset   Heart disease Maternal Grandmother    Heart disease Maternal  Grandfather    Diabetes Paternal Grandfather    Hypertension Mother    Hypertension Father     Social History:  Social History   Socioeconomic History   Marital status: Married    Spouse name: Lewis   Number of children: Not on file   Years of education: Not on file   Highest education level: Not on file  Occupational History   Occupation: Scientist, research (medical)   Tobacco Use   Smoking status: Never   Smokeless tobacco: Never  Vaping Use   Vaping Use: Never used  Substance and Sexual Activity   Alcohol use: Not Currently    Alcohol/week: 10.0 standard drinks    Types: 5 Glasses of wine, 5 Cans of beer per week   Drug use: No   Sexual activity: Yes    Birth control/protection: None  Other Topics Concern   Not on file  Social History Narrative   Not on file   Social Determinants of Health   Financial Resource Strain: Not on file  Food Insecurity: Not on file  Transportation Needs: Not on file  Physical Activity: Not on file  Stress: Not on file  Social Connections: Not on file  Intimate Partner Violence: Not on file    Allergies:  Allergies  Allergen Reactions   Ace Inhibitors Swelling    Lips/ face    Medications: Prior to Admission medications   Medication Sig Start Date End Date Taking? Authorizing Provider  amphetamine-dextroamphetamine (ADDERALL XR) 20 MG 24 hr capsule Take 20 mg by mouth every morning. Patient not taking: No sig reported 02/01/21   [provider]  amphetamine-dextroamphetamine (ADDERALL) 20 MG tablet Take 20 mg by mouth 2 (two) times daily. Patient not taking: No sig reported    [provider]  clonazePAM (KLONOPIN) 0.5 MG tablet Take 1 tablet (0.5 mg total) by mouth 2 (two) times daily as needed for anxiety. 03/22/21   Vena Austria, MD  hydrOXYzine (ATARAX/VISTARIL) 25 MG tablet Take 1 tablet (25 mg total) by mouth every 8 (eight) hours as needed for anxiety. 12/15/20   Vena Austria, MD  ibuprofen (ADVIL) 600 MG tablet Take  1 tablet (600 mg total) by mouth every 6 (six) hours. 03/17/21   Mirna Mires, CNM  labetalol (NORMODYNE) 200 MG tablet TAKE 1 TABLET BY MOUTH TWICE A DAY 03/29/21   Vena Austria, MD  pantoprazole (PROTONIX) 40 MG tablet TAKE 1 TABLET BY MOUTH EVERY DAY 02/18/21   Mirna Mires, CNM  promethazine (PHENERGAN) 25 MG tablet Take 1 tablet (25 mg total) by mouth every 12 (twelve) hours as needed for nausea or vomiting. 02/24/21   Vena Austria, MD  traZODone (DESYREL) 100 MG tablet Take 200 mg by mouth at bedtime. 07/17/20   [provider]    Physical Exam Blood pressure (!) 141/95, height 5\' 4"  (1.626 m), weight 237 lb (107.5 kg), not currently breastfeeding.  General: NAD HEENT: normocephalic, anicteric Pulmonary: No increased work of breathing Abdomen: NABS, soft, non-tender, non-distended.  Umbilicus without lesions.  No hepatomegaly, splenomegaly or masses palpable. No evidence of hernia. Genitourinary:  External: Normal external female genitalia.  Normal urethral meatus, normal  Bartholin's and Skene's glands.    Vagina: Normal vaginal mucosa, no evidence of prolapse.    Cervix: Grossly normal in appearance, no bleeding  Uterus: Non-enlarged, mobile, normal contour.  No CMT  Adnexa: ovaries non-enlarged, no adnexal masses  Rectal: small external hemorrhoid Extremities: no edema, erythema, or tenderness Neurologic: Grossly intact Psychiatric: mood appropriate, affect full    Assessment: 40 y.o. 24 presenting for 6 week postpartum visit  Plan: Problem List Items Addressed This Visit   None Visit Diagnoses     6 weeks postpartum follow-up    -  Primary   BP check       Bipolar 1 disorder, manic, mild (HCC)       Relevant Orders   Ambulatory referral to Psychiatry   External hemorrhoid       Relevant Medications   labetalol (NORMODYNE) 200 MG tablet        1) Contraception - Education given regarding options for contraception, as well as  compatibility with breast feeding if applicable.  Patient plans on oral progesterone-only contraceptive for contraception. - continue progestin only pill, currently on slynd but does not appear to be on formulary  2)  Pap - ASCCP guidelines and rational discussed.  ASCCP guidelines and rational discussed.  Patient opts for every 3 years screening interval  3) Patient underwent screening for postpartum depression.  Given history of worsening manic episode with previous SRI use will refer to psychiatry.  She has ntoed improvement with Klonopin   4) Hemerrhoid - based on formulary coverage anusol suppository written.  Discussed barrier creams  5) CHTN - increase labetalol to 400mg  po bid, discussed switching to procardia as an alternative.  Patient will continue home BP check if remains mildly elevated consider change  6)  Return in about 1 year (around 04/28/2022) for annual.   , MD, 13/08/2021 OB/GYN, [facility] 04/28/2021, 9:15 AM

## 2021-05-23 ENCOUNTER — Other Ambulatory Visit: Payer: Self-pay | Admitting: Obstetrics and Gynecology

## 2021-05-25 ENCOUNTER — Encounter: Payer: Self-pay | Admitting: Obstetrics and Gynecology

## 2021-05-26 ENCOUNTER — Encounter: Payer: Self-pay | Admitting: Obstetrics and Gynecology

## 2021-06-02 NOTE — Telephone Encounter (Signed)
No further documentation needed

## 2021-06-06 ENCOUNTER — Ambulatory Visit: Payer: BC Managed Care – PPO | Admitting: Cardiology

## 2021-06-24 ENCOUNTER — Ambulatory Visit: Payer: Self-pay | Admitting: Psychiatry

## 2021-07-11 ENCOUNTER — Ambulatory Visit (INDEPENDENT_AMBULATORY_CARE_PROVIDER_SITE_OTHER): Payer: BC Managed Care – PPO | Admitting: Cardiology

## 2021-07-11 ENCOUNTER — Other Ambulatory Visit: Payer: Self-pay

## 2021-07-11 ENCOUNTER — Encounter: Payer: Self-pay | Admitting: Cardiology

## 2021-07-11 VITALS — BP 122/70 | HR 63 | Ht 64.0 in | Wt 236.0 lb

## 2021-07-11 DIAGNOSIS — I472 Ventricular tachycardia, unspecified: Secondary | ICD-10-CM

## 2021-07-11 DIAGNOSIS — R55 Syncope and collapse: Secondary | ICD-10-CM | POA: Diagnosis not present

## 2021-07-11 DIAGNOSIS — Z6841 Body Mass Index (BMI) 40.0 and over, adult: Secondary | ICD-10-CM | POA: Diagnosis not present

## 2021-07-11 DIAGNOSIS — I10 Essential (primary) hypertension: Secondary | ICD-10-CM | POA: Diagnosis not present

## 2021-07-11 NOTE — Patient Instructions (Signed)
Medication Instructions:  Your physician recommends that you continue on your current medications as directed. Please refer to the Current Medication list given to you today.  *If you need a refill on your cardiac medications before your next appointment, please call your pharmacy*   Lab Work: None ordered If you have labs (blood work) drawn today and your tests are completely normal, you will receive your results only by: MyChart Message (if you have MyChart) OR A paper copy in the mail If you have any lab test that is abnormal or we need to change your treatment, we will call you to review the results.   Testing/Procedures: None ordered   Follow-Up: At [facility], you and your health needs are our priority.  As part of our continuing mission to provide you with exceptional heart care, we have created designated Provider Care Teams.  These Care Teams include your primary Cardiologist (physician) and Advanced Practice Providers (APPs -  Physician Assistants and Nurse Practitioners) who all work together to provide you with the care you need, when you need it.  We recommend signing up for the patient portal called "MyChart".  Sign up information is provided on this After Visit Summary.  MyChart is used to connect with patients for Virtual Visits (Telemedicine).  Patients are able to view lab/test results, encounter notes, upcoming appointments, etc.  Non-urgent messages can be sent to your provider as well.   To learn more about what you can do with MyChart, go to [email or web address].    Your next appointment:   1 year(s)  The format for your next appointment:   In Person  Provider:   You may see Debbe Odea, MD or one of the following Advanced Practice Providers on your designated Care Team:   Nicolasa Ducking, NP Eula Listen, PA-C    Other Instructions

## 2021-07-11 NOTE — Progress Notes (Signed)
Cardiology Office Note:    Date:  07/11/2021   ID:  Brenda [name], DOB [birth year], MRN [id number]  PCP:  Duanne Limerick, MD   [facility]  Cardiologist:  Debbe Odea, MD  Advanced Practice Provider:  No care team member to display Electrophysiologist:  None       Referring MD: Duanne Limerick, MD   Chief Complaint  Patient presents with   Other    Past due 6 month follow up -- Meds reviewed verbally with patient.     History of Present Illness:    Brenda [name] is a 41 y.o. female with a hx of anxiety, hypertension, who presents for follow-up.    Initially seen due to syncope and dizziness in the context of pregnancy.  Symptoms were deemed secondary to vasovagal effect/hyperemesis, morning sickness.  Cardiac monitor did reveal an episode of VT.  HCTZ was stopped, labetalol started.  She is currently postpartum, no further episodes of dizziness or syncope.  CMR was obtained due to episode of VT, no evidence of scar.   Prior notes Echo 09/10/2020 showed normal systolic and diastolic function, EF 60 to 65%.  Mild MR Cardiac monitor 09/02/2020 showed sinus pauses, one episode of VT lasting 7 beats. HCTZ was stopped due to patient being pregnant, also dizziness and syncope.   Past Medical History:  Diagnosis Date   ADHD, adult residual type    Anxiety    Depression    GERD (gastroesophageal reflux disease)    Hypertension    Wears contact lenses     Past Surgical History:  Procedure Laterality Date   BREAST REDUCTION SURGERY     COLONOSCOPY WITH PROPOFOL N/A 11/09/2016   Procedure: COLONOSCOPY WITH PROPOFOL;  Surgeon: [name] Minium, MD;  Location: [facility];  Service: Endoscopy;  Laterality: N/A;   ESOPHAGOGASTRODUODENOSCOPY (EGD) WITH PROPOFOL N/A 11/09/2016   Procedure: ESOPHAGOGASTRODUODENOSCOPY (EGD) WITH PROPOFOL;  Surgeon: [name] Minium, MD;  Location: [facility];  Service: Endoscopy;  Laterality: N/A;    TONSILLECTOMY      Current Medications: Current Meds  Medication Sig   ALPRAZolam (XANAX) 1 MG tablet Take 1 mg by mouth 3 (three) times daily as needed for anxiety.   amphetamine-dextroamphetamine (ADDERALL XR) 20 MG 24 hr capsule Take 20 mg by mouth daily.   amphetamine-dextroamphetamine (ADDERALL) 20 MG tablet Take 20 mg by mouth 2 (two) times daily.   ibuprofen (ADVIL) 600 MG tablet Take 1 tablet (600 mg total) by mouth every 6 (six) hours.   labetalol (NORMODYNE) 200 MG tablet Take 2 tablets (400 mg total) by mouth 2 (two) times daily.   Lurasidone HCl 60 MG TABS Take 60 mg by mouth daily.   pantoprazole (PROTONIX) 40 MG tablet TAKE 1 TABLET BY MOUTH EVERY DAY   traZODone (DESYREL) 100 MG tablet Take 200 mg by mouth at bedtime.     Allergies:   Ace inhibitors   Social History   Socioeconomic History   Marital status: Married    Spouse name: Lewis   Number of children: Not on file   Years of education: Not on file   Highest education level: Not on file  Occupational History   Occupation: Teacher, adult education   Tobacco Use   Smoking status: Never   Smokeless tobacco: Never  Vaping Use   Vaping Use: Never used  Substance and Sexual Activity   Alcohol use: Not Currently    Alcohol/week: 10.0 standard drinks    Types: 5 Glasses  of wine, 5 Cans of beer per week   Drug use: No   Sexual activity: Yes    Birth control/protection: None  Other Topics Concern   Not on file  Social History Narrative   Not on file   Social Determinants of Health   Financial Resource Strain: Not on file  Food Insecurity: Not on file  Transportation Needs: Not on file  Physical Activity: Not on file  Stress: Not on file  Social Connections: Not on file     Family History: The patient's family history includes Diabetes in her paternal grandfather; Heart disease in her maternal grandfather and maternal grandmother; Hypertension in her father and mother.  ROS:   Please see the history of present  illness.     All other systems reviewed and are negative.  EKGs/Labs/Other Studies Reviewed:    The following studies were reviewed today:   EKG:  EKG is  ordered today.  The ekg ordered today demonstrates normal sinus rhythm.  Recent Labs: 08/05/2020: TSH 1.640 03/15/2021: ALT 13; BUN 9; Creatinine, Ser 0.57; Potassium 3.9; Sodium 135 03/16/2021: Hemoglobin 10.1; Platelets 220  Recent Lipid Panel No results found for: CHOL, TRIG, HDL, CHOLHDL, VLDL, LDLCALC, LDLDIRECT   Risk Assessment/Calculations:      Physical Exam:    VS:  BP 122/70 (BP Location: Left Arm, Patient Position: Sitting, Cuff Size: Large)    Pulse 63    Ht 5\' 4"  (1.626 m)    Wt 236 lb (107 kg)    BMI 40.51 kg/m     Wt Readings from Last 3 Encounters:  07/11/21 236 lb (107 kg)  04/28/21 237 lb (107.5 kg)  03/22/21 236 lb (107 kg)     GEN:  Well nourished, well developed in no acute distress HEENT: Normal NECK: No JVD; No carotid bruits LYMPHATICS: No lymphadenopathy CARDIAC: RRR, no murmurs, rubs, gallops RESPIRATORY:  Clear to auscultation without rales, wheezing or rhonchi  ABDOMEN: Soft, non-tender, non-distended MUSCULOSKELETAL:  No edema; No deformity  SKIN: Warm and dry NEUROLOGIC:  Alert and oriented x 3 PSYCHIATRIC:  Normal affect   ASSESSMENT:    1. Syncope and collapse   2. V-tach   3. Primary hypertension   4. BMI 40.0-44.9, adult (HCC)     PLAN:    In order of problems listed above:  Syncope, dizziness while pregnant, motion sickness sinus pauses secondary to increased vagal tone.  No further occurrences postpartum.  Echo 08/2020 EF 60 to 65%, diastolic function normal.  Continue to monitor 1 episode of VT noted on prior cardiac monitor.  Echo was normal. CMR did not show any structural abnormalities or scar. hypertension, BP controlled with labetalol.  Continue labetalol 200 mg twice daily. Morbid obesity, low-calorie diet, weight loss advised.   Follow-up yearly   Medication  Adjustments/Labs and Tests Ordered: Current medicines are reviewed at length with the patient today.  Concerns regarding medicines are outlined above.  Orders Placed This Encounter  Procedures   EKG 12-Lead   No orders of the defined types were placed in this encounter.   Patient Instructions  Medication Instructions:  Your physician recommends that you continue on your current medications as directed. Please refer to the Current Medication list given to you today.  *If you need a refill on your cardiac medications before your next appointment, please call your pharmacy*   Lab Work: None ordered If you have labs (blood work) drawn today and your tests are completely normal, you will receive your results  only by: [employer] ScientificMyChart Message (if you have MyChart) OR A paper copy in the mail If you have any lab test that is abnormal or we need to change your treatment, we will call you to review the results.   Testing/Procedures: None ordered   Follow-Up: At [facility], you and your health needs are our priority.  As part of our continuing mission to provide you with exceptional heart care, we have created designated Provider Care Teams.  These Care Teams include your primary Cardiologist (physician) and Advanced Practice Providers (APPs -  Physician Assistants and Nurse Practitioners) who all work together to provide you with the care you need, when you need it.  We recommend signing up for the patient portal called "MyChart".  Sign up information is provided on this After Visit Summary.  MyChart is used to connect with patients for Virtual Visits (Telemedicine).  Patients are able to view lab/test results, encounter notes, upcoming appointments, etc.  Non-urgent messages can be sent to your provider as well.   To learn more about what you can do with MyChart, go to [email or web address].    Your next appointment:   1 year(s)  The format for your next appointment:   In  Person  Provider:   You may see Debbe OdeaBrian Agbor-Etang, MD or one of the following Advanced Practice Providers on your designated Care Team:   Nicolasa Duckinghristopher Berge, NP Eula Listenyan Dunn, PA-C    Other Instructions     Signed, Debbe OdeaBrian Agbor-Etang, MD  07/11/2021 11:04 AM    [facility]

## 2021-07-18 ENCOUNTER — Encounter: Payer: Self-pay | Admitting: Psychiatry

## 2021-07-18 ENCOUNTER — Ambulatory Visit (INDEPENDENT_AMBULATORY_CARE_PROVIDER_SITE_OTHER): Payer: BC Managed Care – PPO | Admitting: Psychiatry

## 2021-07-18 ENCOUNTER — Other Ambulatory Visit: Payer: Self-pay

## 2021-07-18 VITALS — BP 143/83 | HR 69 | Temp 97.8°F | Wt 234.2 lb

## 2021-07-18 DIAGNOSIS — F410 Panic disorder [episodic paroxysmal anxiety] without agoraphobia: Secondary | ICD-10-CM | POA: Diagnosis not present

## 2021-07-18 DIAGNOSIS — F509 Eating disorder, unspecified: Secondary | ICD-10-CM | POA: Diagnosis not present

## 2021-07-18 DIAGNOSIS — F3170 Bipolar disorder, currently in remission, most recent episode unspecified: Secondary | ICD-10-CM | POA: Insufficient documentation

## 2021-07-18 DIAGNOSIS — Z789 Other specified health status: Secondary | ICD-10-CM | POA: Insufficient documentation

## 2021-07-18 DIAGNOSIS — Z8659 Personal history of other mental and behavioral disorders: Secondary | ICD-10-CM | POA: Diagnosis not present
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
## 2021-07-30 ENCOUNTER — Other Ambulatory Visit: Payer: Self-pay

## 2021-07-30 ENCOUNTER — Emergency Department: Payer: BC Managed Care – PPO

## 2021-07-30 ENCOUNTER — Inpatient Hospital Stay
Admission: EM | Admit: 2021-07-30 | Discharge: 2021-08-03 | DRG: 419 | Disposition: A | Discharge status: Home / Self Care | Payer: BC Managed Care – PPO | Attending: Internal Medicine | Admitting: Internal Medicine

## 2021-07-30 ENCOUNTER — Encounter: Payer: Self-pay | Admitting: Emergency Medicine

## 2021-07-30 DIAGNOSIS — K8051 Calculus of bile duct without cholangitis or cholecystitis with obstruction: Secondary | ICD-10-CM

## 2021-07-30 DIAGNOSIS — K8050 Calculus of bile duct without cholangitis or cholecystitis without obstruction: Secondary | ICD-10-CM | POA: Diagnosis not present

## 2021-07-30 DIAGNOSIS — K8020 Calculus of gallbladder without cholecystitis without obstruction: Secondary | ICD-10-CM | POA: Diagnosis not present

## 2021-07-30 DIAGNOSIS — F908 Attention-deficit hyperactivity disorder, other type: Secondary | ICD-10-CM | POA: Diagnosis present

## 2021-07-30 DIAGNOSIS — I10 Essential (primary) hypertension: Secondary | ICD-10-CM | POA: Diagnosis present

## 2021-07-30 DIAGNOSIS — E876 Hypokalemia: Secondary | ICD-10-CM | POA: Diagnosis not present

## 2021-07-30 DIAGNOSIS — R932 Abnormal findings on diagnostic imaging of liver and biliary tract: Secondary | ICD-10-CM | POA: Diagnosis not present

## 2021-07-30 DIAGNOSIS — F419 Anxiety disorder, unspecified: Secondary | ICD-10-CM | POA: Diagnosis not present

## 2021-07-30 DIAGNOSIS — K819 Cholecystitis, unspecified: Secondary | ICD-10-CM | POA: Diagnosis not present

## 2021-07-30 DIAGNOSIS — K219 Gastro-esophageal reflux disease without esophagitis: Secondary | ICD-10-CM | POA: Diagnosis present

## 2021-07-30 DIAGNOSIS — Z818 Family history of other mental and behavioral disorders: Secondary | ICD-10-CM

## 2021-07-30 DIAGNOSIS — Z79899 Other long term (current) drug therapy: Secondary | ICD-10-CM

## 2021-07-30 DIAGNOSIS — K8013 Calculus of gallbladder with acute and chronic cholecystitis with obstruction: Secondary | ICD-10-CM | POA: Diagnosis not present

## 2021-07-30 DIAGNOSIS — Z888 Allergy status to other drugs, medicaments and biological substances status: Secondary | ICD-10-CM | POA: Diagnosis not present

## 2021-07-30 DIAGNOSIS — Z833 Family history of diabetes mellitus: Secondary | ICD-10-CM | POA: Diagnosis not present

## 2021-07-30 DIAGNOSIS — K8012 Calculus of gallbladder with acute and chronic cholecystitis without obstruction: Secondary | ICD-10-CM | POA: Diagnosis not present

## 2021-07-30 DIAGNOSIS — R1011 Right upper quadrant pain: Secondary | ICD-10-CM | POA: Diagnosis not present

## 2021-07-30 DIAGNOSIS — R935 Abnormal findings on diagnostic imaging of other abdominal regions, including retroperitoneum: Secondary | ICD-10-CM | POA: Diagnosis not present

## 2021-07-30 DIAGNOSIS — Z8249 Family history of ischemic heart disease and other diseases of the circulatory system: Secondary | ICD-10-CM | POA: Diagnosis not present

## 2021-07-30 DIAGNOSIS — Z20822 Contact with and (suspected) exposure to covid-19: Secondary | ICD-10-CM | POA: Diagnosis not present

## 2021-07-30 DIAGNOSIS — K8070 Calculus of gallbladder and bile duct without cholecystitis without obstruction: Secondary | ICD-10-CM | POA: Diagnosis not present

## 2021-07-30 DIAGNOSIS — F32A Depression, unspecified: Secondary | ICD-10-CM | POA: Diagnosis not present

## 2021-07-30 DIAGNOSIS — K812 Acute cholecystitis with chronic cholecystitis: Secondary | ICD-10-CM | POA: Diagnosis not present

## 2021-07-30 DIAGNOSIS — R9431 Abnormal electrocardiogram [ECG] [EKG]: Secondary | ICD-10-CM | POA: Diagnosis not present

## 2021-07-30 LAB — CBC
HCT: 42.4 % (ref 36.0–46.0)
Hemoglobin: 13.5 g/dL (ref 12.0–15.0)
MCH: 26.8 pg (ref 26.0–34.0)
MCHC: 31.8 g/dL (ref 30.0–36.0)
MCV: 84.1 fL (ref 80.0–100.0)
Platelets: 328 10*3/uL (ref 150–400)
RBC: 5.04 MIL/uL (ref 3.87–5.11)
RDW: 14.1 % (ref 11.5–15.5)
WBC: 16.2 10*3/uL — ABNORMAL HIGH (ref 4.0–10.5)
nRBC: 0 % (ref 0.0–0.2)

## 2021-07-30 LAB — LIPASE, BLOOD: Lipase: 42 U/L (ref 11–51)

## 2021-07-30 LAB — URINALYSIS, COMPLETE (UACMP) WITH MICROSCOPIC
Glucose, UA: NEGATIVE mg/dL
Hgb urine dipstick: NEGATIVE
Ketones, ur: 40 mg/dL — AB
Leukocytes,Ua: NEGATIVE
Nitrite: NEGATIVE
Protein, ur: 100 mg/dL — AB
Specific Gravity, Urine: 1.025 (ref 1.005–1.030)
pH: 6 (ref 5.0–8.0)

## 2021-07-30 LAB — COMPREHENSIVE METABOLIC PANEL
ALT: 62 U/L — ABNORMAL HIGH (ref 0–44)
AST: 96 U/L — ABNORMAL HIGH (ref 15–41)
Albumin: 4.2 g/dL (ref 3.5–5.0)
Alkaline Phosphatase: 64 U/L (ref 38–126)
Anion gap: 6 (ref 5–15)
BUN: 13 mg/dL (ref 6–20)
CO2: 22 mmol/L (ref 22–32)
Calcium: 9.2 mg/dL (ref 8.9–10.3)
Chloride: 107 mmol/L (ref 98–111)
Creatinine, Ser: 0.75 mg/dL (ref 0.44–1.00)
GFR, Estimated: >60 mL/min (ref >60)
Glucose, Bld: 109 mg/dL — ABNORMAL HIGH (ref 70–99)
Potassium: 3.3 mmol/L — ABNORMAL LOW (ref 3.5–5.1)
Sodium: 135 mmol/L (ref 135–145)
Total Bilirubin: 1 mg/dL (ref 0.3–1.2)
Total Protein: 7.6 g/dL (ref 6.5–8.1)

## 2021-07-30 LAB — POC URINE PREG, ED: Preg Test, Ur: NEGATIVE

## 2021-07-30 IMAGING — US US ABDOMEN LIMITED
1 series · 14 of 25 positions shown · non-contrast
Comparison: None.

CLINICAL DATA: Right upper quadrant pain.

EXAM:
ULTRASOUND ABDOMEN LIMITED RIGHT UPPER QUADRANT

[Series 1: us abdomen limited ruq (liver/gb) · 14 of 39 slices shown]
[im 1/39]
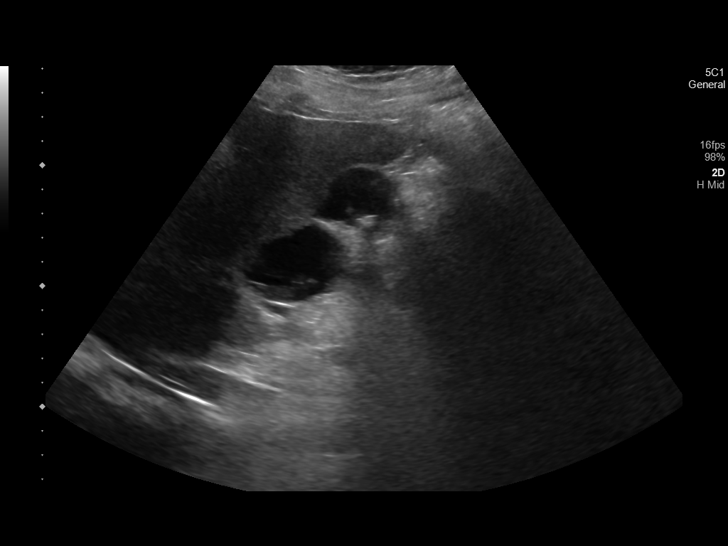
[im 4/39]
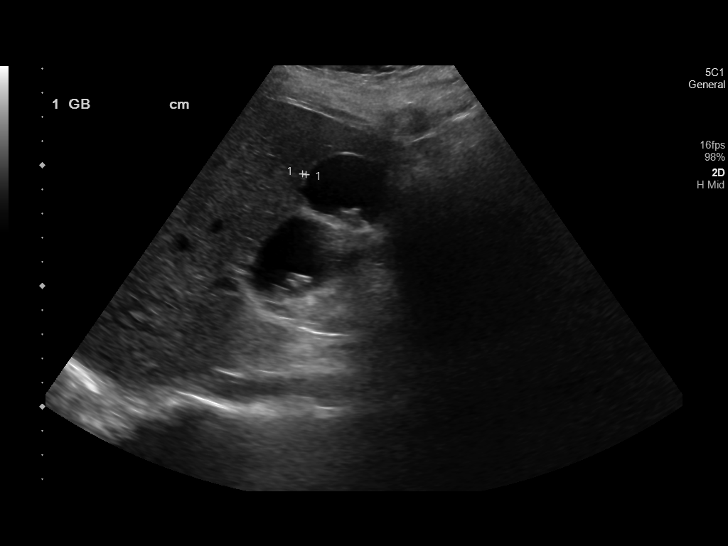
[im 7/39]
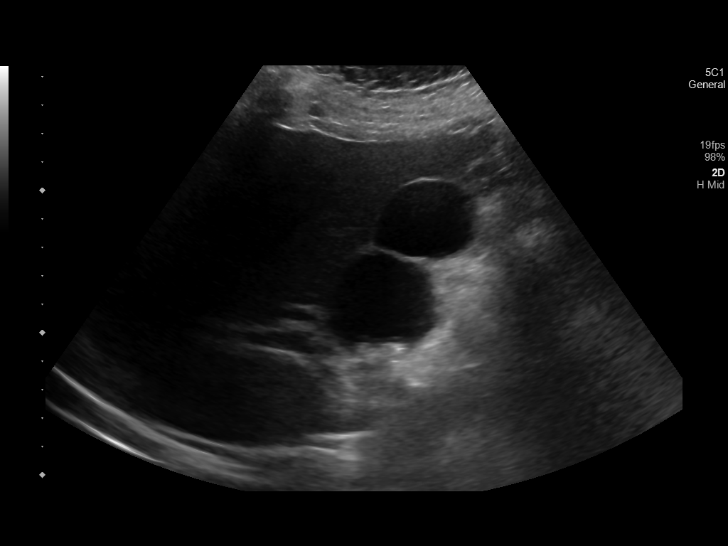
[im 10/39]
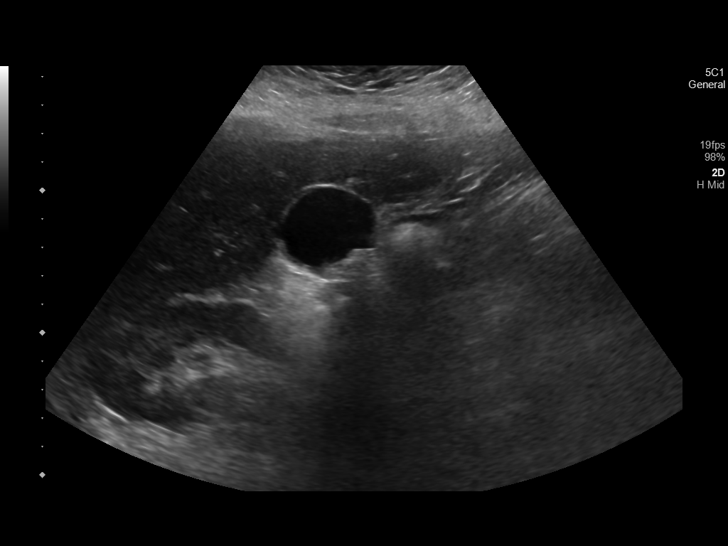
[im 13/39]
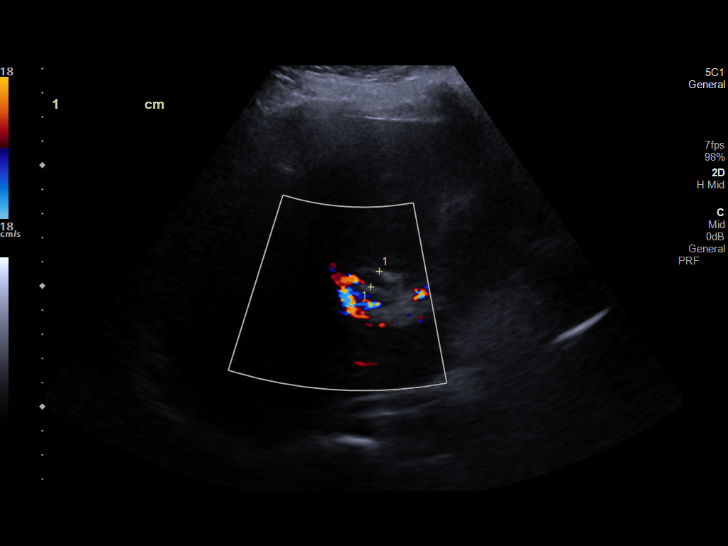
[im 15/39]
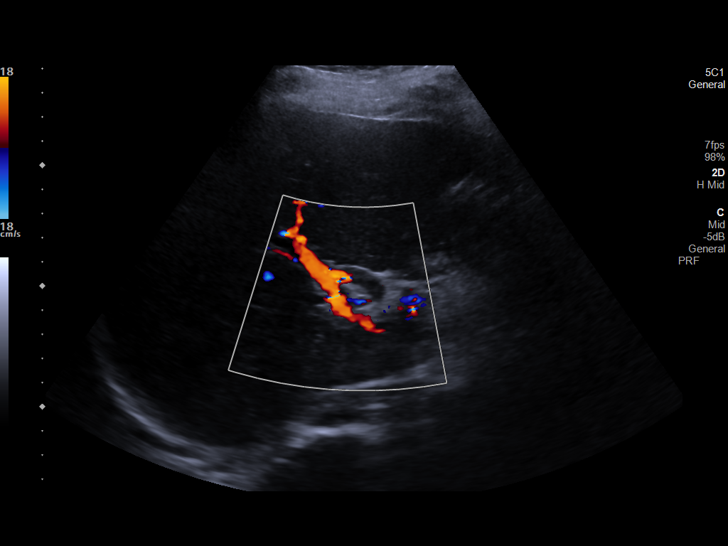
[im 18/39]
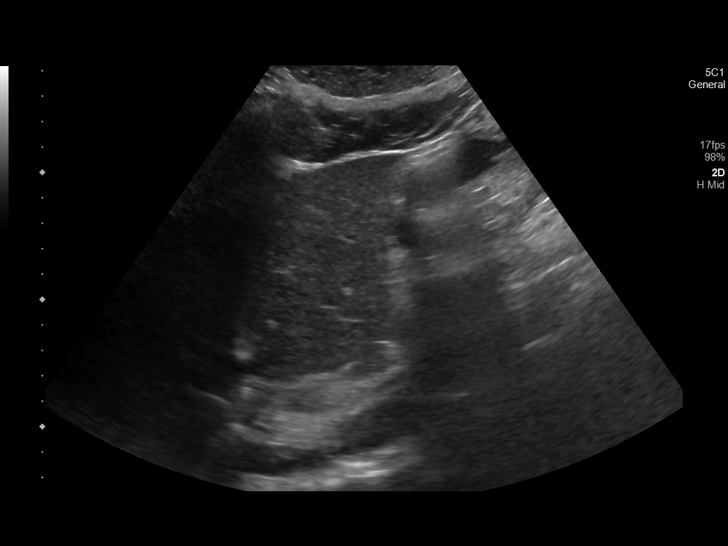
[im 21/39]
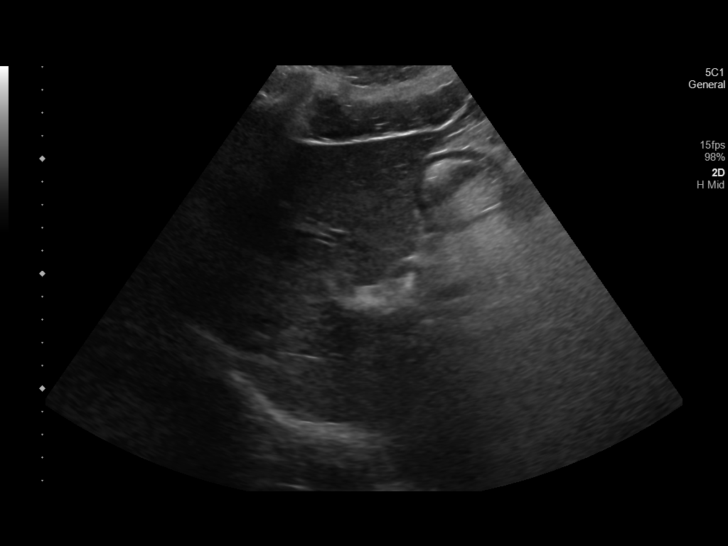
[im 24/39]
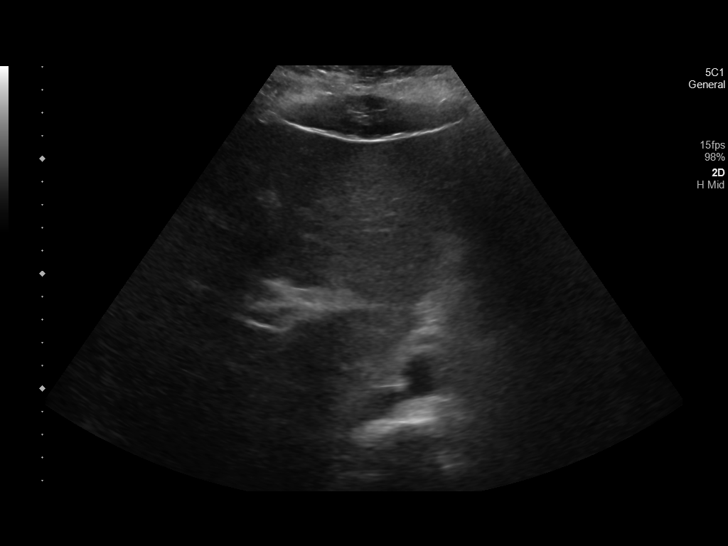
[im 26/39]
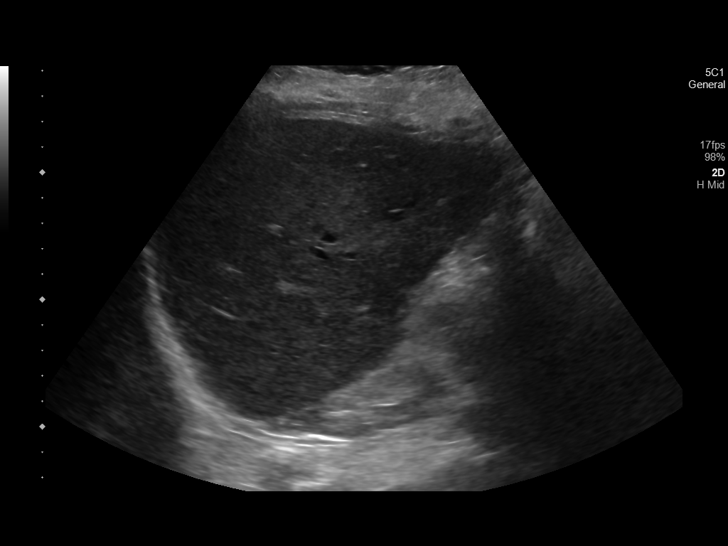
[im 29/39]
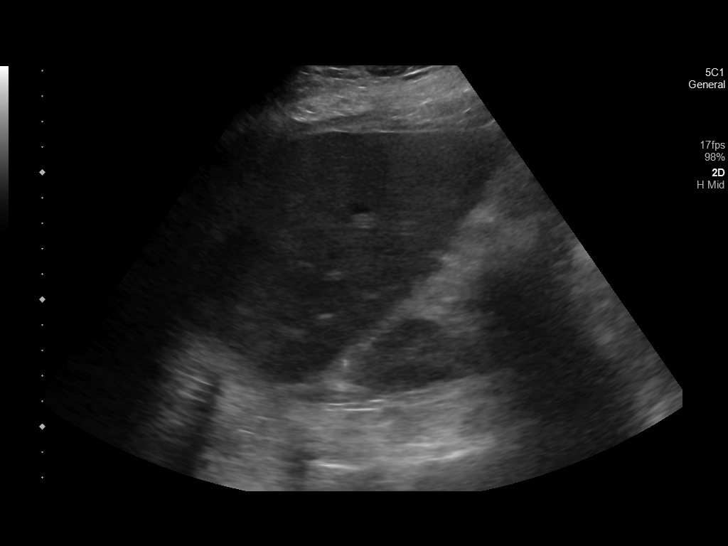
[im 32/39]
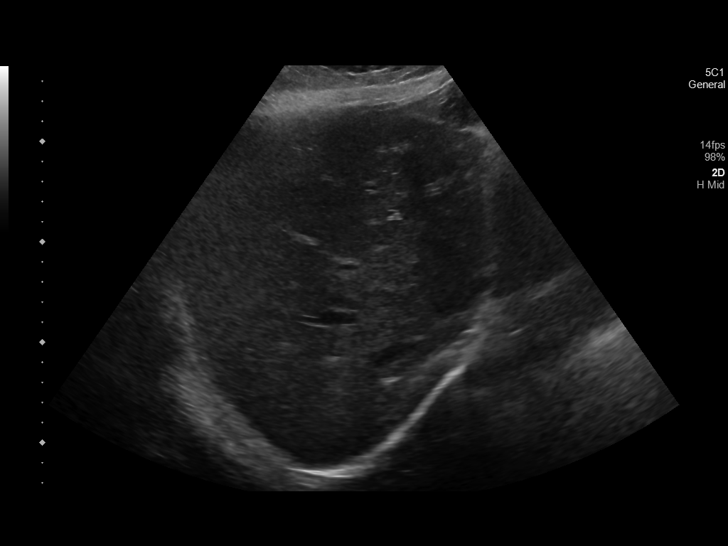
[im 35/39]
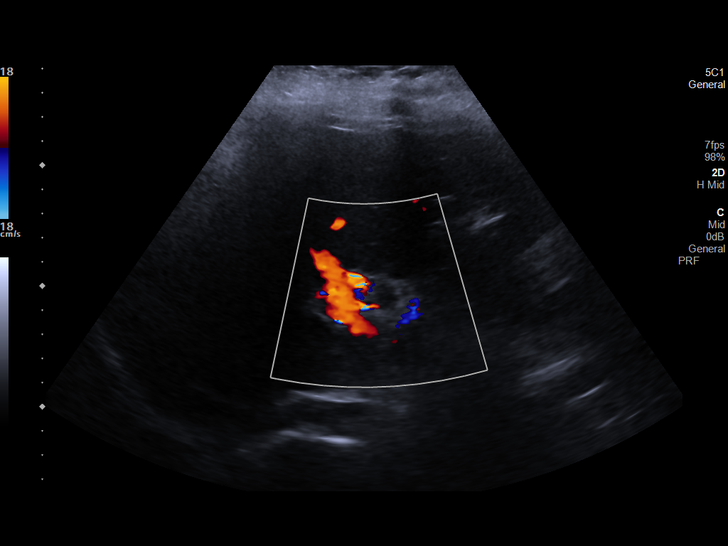
[im 39/39]
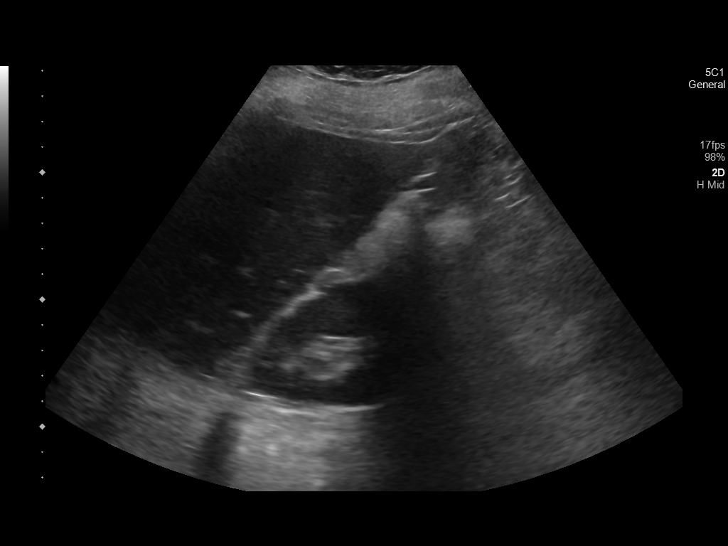

[14 of 25 positions shown; findings below may reference images not displayed]

FINDINGS: Gallbladder:

Multiple small stones are demonstrated in the gallbladder. No
gallbladder wall thickening or edema. Murphy's sign is negative.

Common bile duct:

Diameter: 7 mm, normal

Liver:

Mildly increased parenchymal echotexture suggesting fatty
infiltration. No focal lesions identified. Incomplete visualization
due to rib shadowing. Portal vein is patent on color Doppler imaging
with normal direction of blood flow towards the liver.

Other: None.
IMPRESSION: Cholelithiasis without additional evidence of acute cholecystitis.

## 2021-07-30 IMAGING — MR MR MRCP
9 of 10 series · 37 of 48 positions shown · non-contrast
Comparison: Ultrasound exam earlier same day. CT stone study
[DATE].

CLINICAL DATA: Cholelithiasis.

EXAM:
MRI ABDOMEN WITHOUT CONTRAST  (INCLUDING MRCP)
TECHNIQUE: Multiplanar multisequence MR imaging of the abdomen was performed.
Heavily T2-weighted images of the biliary and pancreatic ducts were
obtained, and three-dimensional MRCP images were rendered by post
processing.

[Series 3: T2 · coronal · 6.0mm · 1.19mm/px · 2 of 30 slices shown (1 of 2)]
[im 1/30]
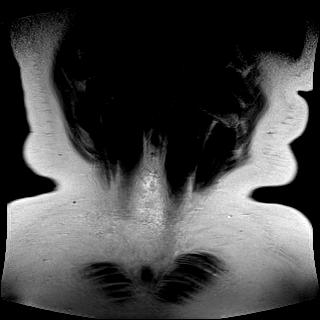
[im 30/30]
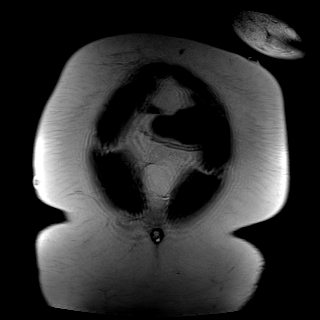

[Series 4: T2 · axial · 6.5mm · 1.19mm/px · z∈[-11,+247]mm · 4 of 34 slices shown (2 of 2)]
[im 1/34]
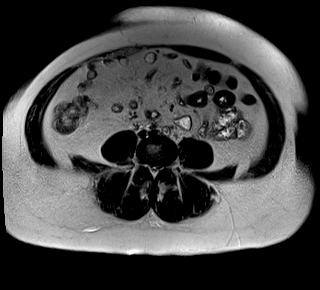
[im 12/34]
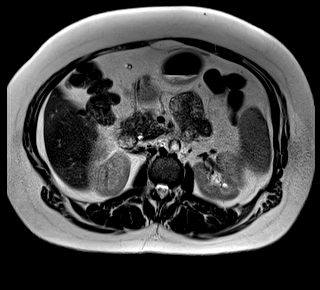
[im 23/34]
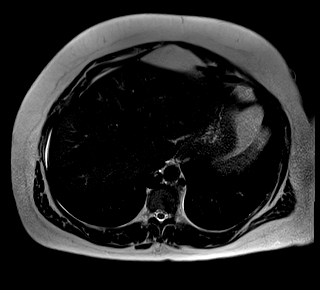
[im 34/34]
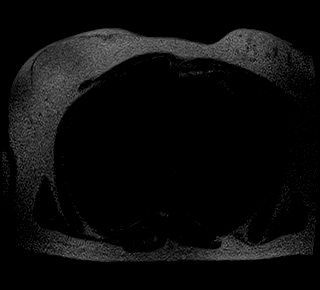

[Series 5: T1 · axial · 3.0mm · 1.19mm/px · z∈[-9,+228]mm · 8 of 80 slices shown (1 of 2)]
[im 1/80]
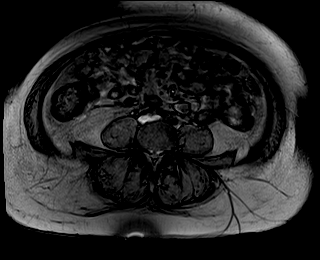
[im 12/80]
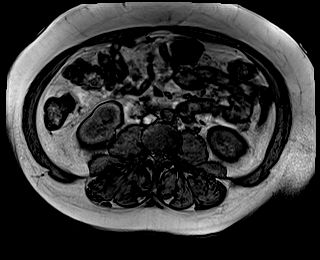
[im 23/80]
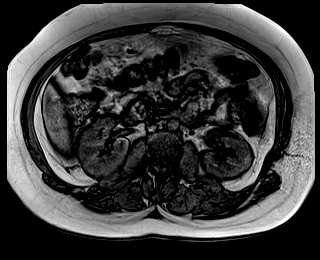
[im 34/80]
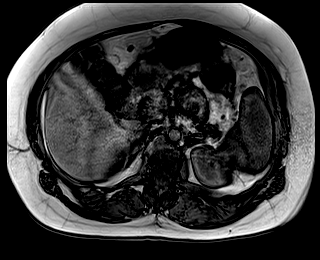
[im 46/80]
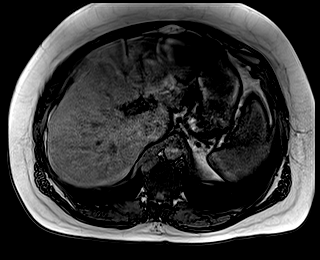
[im 57/80]
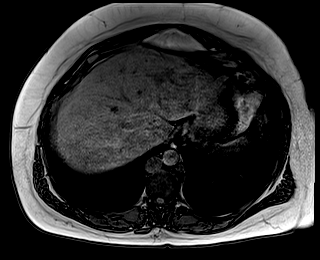
[im 68/80]
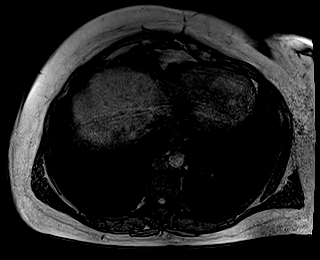
[im 80/80]
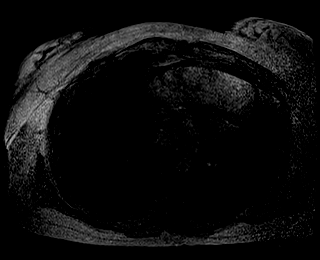

[Series 6: T1 · axial · 3.0mm · 1.19mm/px · z∈[-9,+228]mm · 8 of 80 slices shown (2 of 2)]
[im 1/80]
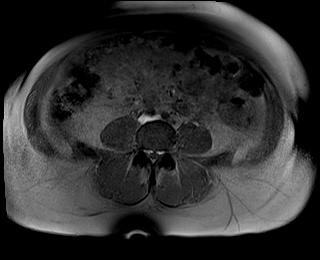
[im 12/80]
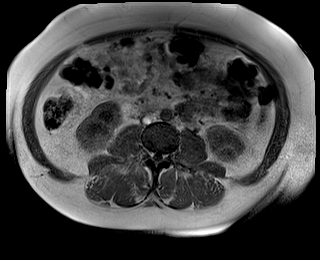
[im 23/80]
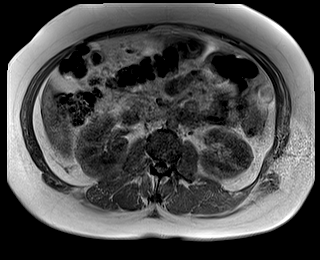
[im 34/80]
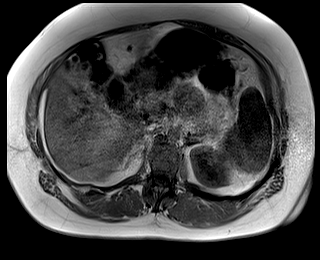
[im 46/80]
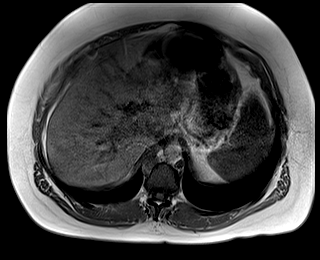
[im 57/80]
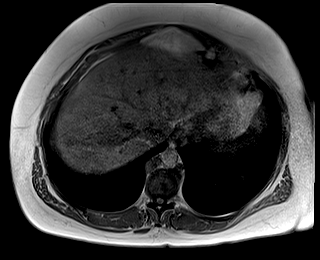
[im 68/80]
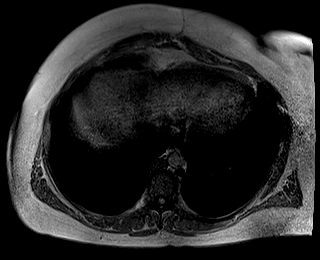
[im 80/80]
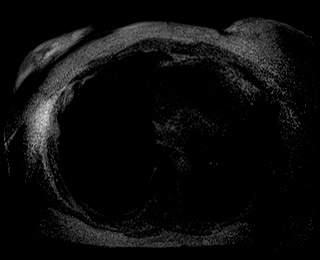

[Series 9: T2 fat-sat · axial · 6.5mm · 1.19mm/px · z∈[+0,+258]mm · 4 of 34 slices shown]
[im 1/34]
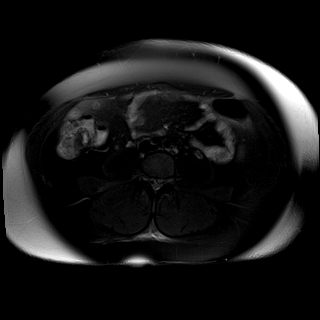
[im 12/34]
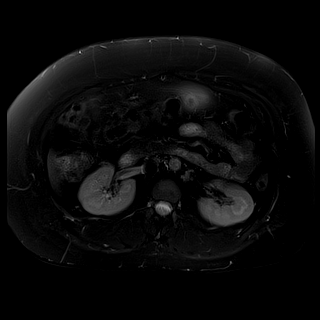
[im 23/34]
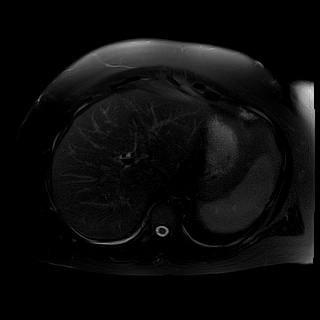
[im 34/34]
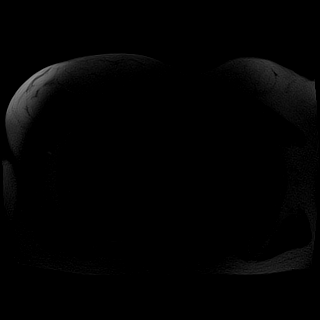

[Series 10: ax dwi_tracew · axial · 6.5mm · 1.42mm/px · z∈[+0,+258]mm · 4 of 34 slices shown]
[im 1/34]
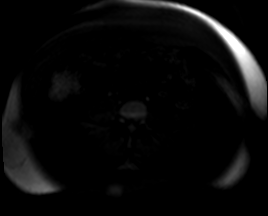
[im 12/34]
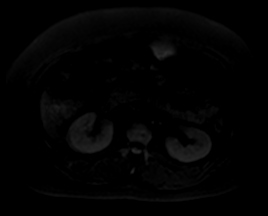
[im 23/34]
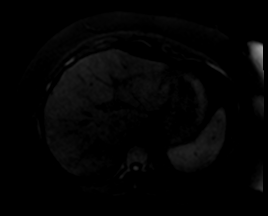
[im 34/34]
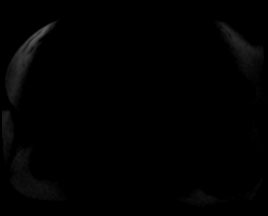

[Series 11: ax dwi_adc · axial · 6.5mm · 1.42mm/px · z∈[+0,+258]mm · 4 of 34 slices shown]
[im 1/34]
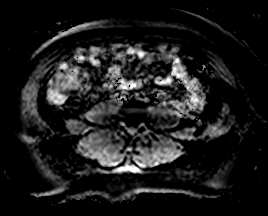
[im 12/34]
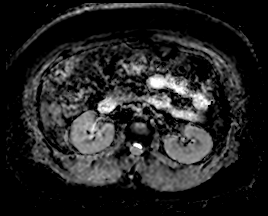
[im 23/34]
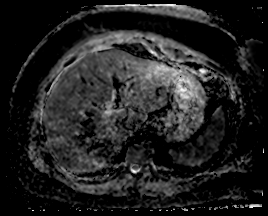
[im 34/34]
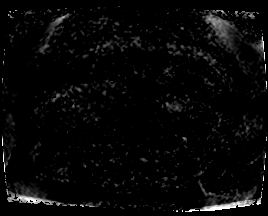

[Series 15: MRCP · coronal · 3.0mm · 1.12mm/px · 2 of 17 slices shown]
[im 1/17]
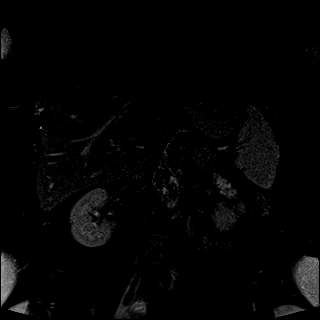
[im 17/17]
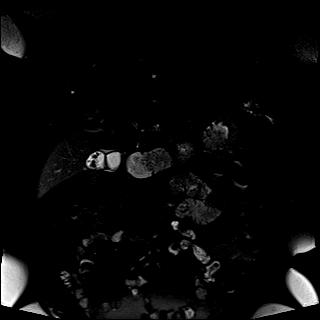

[Series 16: radials · coronal · 50.0mm · 0.78mm/px · 1 of 5 slices shown]
[im 1/5]
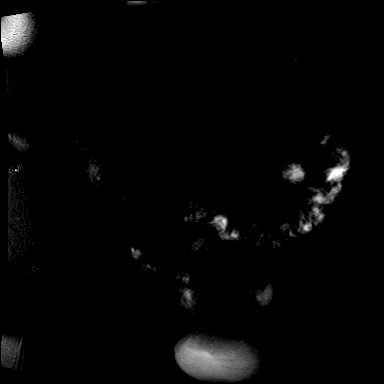

[37 of 48 positions shown; findings below may reference images not displayed]

FINDINGS: Lower chest: Unremarkable.

Hepatobiliary: No suspicious focal abnormality within the liver
parenchyma. Multiple tiny gallstones evident in the gallbladder
demonstrating a prominent fold. Most of the stones measure in the
4-7 mm size range. No intrahepatic biliary duct dilatation.
Extrahepatic common bile duct measures 5-6 mm. There does appear to
be a tiny 2-3 mm dependent stone in the distal common bile duct
(axial T2 image 19 of series 9 in best demonstrated on 3D MRCP image
96 of series 18). No gallbladder wall thickening. Possible trace
pericholecystic edema.

Pancreas: No focal mass lesion. No dilatation of the main duct. No
intraparenchymal cyst. No peripancreatic edema.

Spleen:  No splenomegaly. No focal mass lesion.

Adrenals/Urinary Tract: No adrenal nodule or mass. Kidneys
unremarkable.

Stomach/Bowel: Stomach is unremarkable. No gastric wall thickening.
No evidence of outlet obstruction. Duodenum is normally positioned
as is the ligament of Treitz. No small bowel or colonic dilatation
within the visualized abdomen.

Vascular/Lymphatic: No abdominal aortic aneurysm. Azygous
continuation the inferior vena cava noted incidentally. There is no
gastrohepatic or hepatoduodenal ligament lymphadenopathy. No
retroperitoneal or mesenteric lymphadenopathy.

Other:  No intraperitoneal free fluid.

Musculoskeletal: No suspicious marrow signal abnormality.
IMPRESSION: 1. Cholelithiasis with a prominent fold in the gallbladder. Possible
trace pericholecystic edema. No gallbladder wall thickening.
2. Tiny 2-3 mm stone in the distal common bile duct. No intrahepatic
biliary duct dilatation.
3. Azygous continuation of the inferior vena cava.

## 2021-07-30 MED ORDER — SODIUM CHLORIDE 0.9 % IV BOLUS
500.0000 mL | Freq: Once | INTRAVENOUS | Status: AC
  Administered 2021-07-30: 500 mL via INTRAVENOUS

## 2021-07-30 MED ORDER — KETOROLAC TROMETHAMINE 30 MG/ML IJ SOLN
15.0000 mg | Freq: Once | INTRAMUSCULAR | Status: AC
  Administered 2021-07-30: 15 mg via INTRAVENOUS
  Filled 2021-07-30: qty 1

## 2021-07-30 MED ORDER — ONDANSETRON HCL 4 MG/2ML IJ SOLN
4.0000 mg | Freq: Once | INTRAMUSCULAR | Status: AC
  Administered 2021-07-30: 4 mg via INTRAVENOUS
  Filled 2021-07-30: qty 2

## 2021-07-30 MED ORDER — MORPHINE SULFATE (PF) 4 MG/ML IV SOLN
4.0000 mg | INTRAVENOUS | Status: DC | PRN
  Administered 2021-07-30 – 2021-08-02 (×10): 4 mg via INTRAVENOUS
  Filled 2021-07-30 (×11): qty 1

## 2021-07-30 MED ORDER — CEFTRIAXONE SODIUM 1 G IJ SOLR
1.0000 g | Freq: Once | INTRAMUSCULAR | Status: AC
Start: 2021-07-31 — End: 2021-07-31
  Administered 2021-07-31: 1 g via INTRAVENOUS
  Filled 2021-07-30: qty 10

## 2021-07-30 NOTE — ED Notes (Signed)
Provider at bedside to update patient and mother on test results and plan of care

## 2021-07-30 NOTE — ED Provider Notes (Addendum)
[facility] Provider Note    Event Date/Time   First MD Initiated Contact with Patient 07/30/21 2000     (approximate)   History   Abdominal Pain   HPI  Brenda [name] is a 41 y.o. female with a history of GERD presents to the ER for evaluation of right upper quadrant abdominal pain.  Episode today started around 3:00.  Has had similar episodes in the past.  Denies any fatty or spicy food ingestion.  No measured fevers but has some nausea.  Has not diarrheal illness earlier in the week.  States the pain is mild to moderate in severity.     Physical Exam   Triage Vital Signs: ED Triage Vitals  Enc Vitals Group     BP 07/30/21 2002 (!) 145/79     Pulse Rate 07/30/21 2002 64     Resp 07/30/21 2002 20     Temp 07/30/21 2002 98.1 F (36.7 C)     Temp Source 07/30/21 2002 Oral     SpO2 07/30/21 2002 99 %     Weight --      Height --      Head Circumference --      Peak Flow --      Pain Score 07/30/21 1957 8     Pain Loc --      Pain Edu? --      Excl. in GC? --     Most recent vital signs: Vitals:   07/30/21 2200 07/30/21 2230  BP: 122/66 (!) 115/59  Pulse: 62 64  Resp: 16 18  Temp:    SpO2: 97% 97%     Constitutional: Alert  Eyes: Conjunctivae are normal.  Head: Atraumatic. Nose: No congestion/rhinnorhea. Mouth/Throat: Mucous membranes are moist.   Neck: Painless ROM.  Cardiovascular:   Good peripheral circulation. Respiratory: Normal respiratory effort.  No retractions.  Gastrointestinal: Soft and nontender.  Musculoskeletal:  no deformity Neurologic:  MAE spontaneously. No gross focal neurologic deficits are appreciated.  Skin:  Skin is warm, dry and intact. No rash noted. Psychiatric: Mood and affect are normal. Speech and behavior are normal.    ED Results / Procedures / Treatments   Labs (all labs ordered are listed, but only abnormal results are displayed) Labs Reviewed  CBC - Abnormal; Notable for the  following components:      Result Value   WBC 16.2 (*)    All other components within normal limits  COMPREHENSIVE METABOLIC PANEL - Abnormal; Notable for the following components:   Potassium 3.3 (*)    Glucose, Bld 109 (*)    AST 96 (*)    ALT 62 (*)    All other components within normal limits  URINALYSIS, COMPLETE (UACMP) WITH MICROSCOPIC - Abnormal; Notable for the following components:   Color, Urine YELLOW (*)    APPearance CLEAR (*)    Bilirubin Urine MODERATE (*)    Ketones, ur 40 (*)    Protein, ur 100 (*)    Bacteria, UA MANY (*)    All other components within normal limits  LIPASE, BLOOD  POC URINE PREG, ED     EKG2  ED ECG REPORT I, Willy Eddy, the attending physician, personally viewed and interpreted this ECG.   Date: 07/30/2021  EKG Time: 22:11  Rate: 120  Rhythm: sinus  Axis: normal  Intervals:normal intervals  ST&T Change: no stemi, nonspecific st abn    RADIOLOGY Please see ED Course for my review and  interpretation.  I personally reviewed all radiographic images ordered to evaluate for the above acute complaints and reviewed radiology reports and findings.  These findings were personally discussed with the patient.  Please see medical record for radiology report.    PROCEDURES:  Critical Care performed:   Procedures   MEDICATIONS ORDERED IN ED: Medications  morphine (PF) 4 MG/ML injection 4 mg (4 mg Intravenous Given 07/30/21 2111)  cefTRIAXone (ROCEPHIN) 1 g in sodium chloride 0.9 % 100 mL IVPB (has no administration in time range)  ondansetron (ZOFRAN) injection 4 mg (4 mg Intravenous Given 07/30/21 2111)  sodium chloride 0.9 % bolus 500 mL (0 mLs Intravenous Stopped 07/30/21 2200)  ketorolac (TORADOL) 30 MG/ML injection 15 mg (15 mg Intravenous Given 07/30/21 2220)     IMPRESSION / MDM / ASSESSMENT AND PLAN / ED COURSE  I reviewed the triage vital signs and the nursing notes.                              Differential diagnosis  includes, but is not limited to, cholelithiasis, biliary colic, cholecystitis, cholangitis, choledocholithiasis, pancreatitis, enteritis  Patient presenting with right upper quadrant pain acutely starting today as described above she is afebrile does appear uncomfortable mildly tachycardic not septic appearing clinically blood work ordered for the but differential will order right upper quadrant ultrasound.  Will order IV morphine and IV Zofran as well as IV fluids.   Clinical Course as of 07/30/21 2358  Sat Jul 30, 2021  2134 Ultrasound by my review shows cholelithiasis.  [PR]  2157 Noted mild leukocytosis but she always seems to carry a bit of leukocytosis AST and ALT are mildly elevated normal bilirubin normal lipase. [PR]  2215 I consulted with Dr. Maia Plan of surgery current plan will be MRCP to evaluate for choledocholithiasis.  If MRCP negative and patient's pain is controlled will be appropriate for outpatient follow-up.  Patient currently pain-free.  Patient will be signed out to oncoming physician. [PR]  2353 Per radiology, MRCP concerning for distal stone.  I consulted with Dr. Norma Fredrickson of GI.  Patient ok to be admitted here.  Patient updated and agreeable to plan. [PR]    Clinical Course User Index [PR] Willy Eddy, MD     FINAL CLINICAL IMPRESSION(S) / ED DIAGNOSES   Final diagnoses:  RUQ abdominal pain  Calculus of gallbladder without cholecystitis without obstruction     Rx / DC Orders   ED Discharge Orders     None        Note:  This document was prepared using Dragon voice recognition software and may include unintentional dictation errors.    Willy Eddy, MD 07/30/21 Jackquline Bosch    Willy Eddy, MD 07/30/21 [phone]

## 2021-07-30 NOTE — ED Provider Notes (Incomplete)
----------------------------------------- °  11:01 PM on 07/30/2021 -----------------------------------------  Assuming care from Dr. Roxan Hockey.  In short, [place] Brenda [name] is a 41 y.o. female with a chief complaint of abdominal pain with mild LFT elevation.  Refer to the original H&P for additional details.  The current plan of care is to follow up MRCP and reassess.   ***

## 2021-07-30 NOTE — ED Notes (Signed)
Patient to have MRCP done. Taken via wheelchair.

## 2021-07-30 NOTE — ED Notes (Signed)
Patient resting comfortably on stretcher. RR even and unlabored. Patient verbalizes no complaints at this time. Patient requesting additional blanket which was provided to her. Mother remains at bedside. Updated on plan of care.

## 2021-07-30 NOTE — ED Triage Notes (Signed)
Pt c/o RUQ abd pain, states started at approx 1500 today, states thinks it is her gallbladder, has hx of same pain before.

## 2021-07-31 DIAGNOSIS — K219 Gastro-esophageal reflux disease without esophagitis: Secondary | ICD-10-CM | POA: Diagnosis present

## 2021-07-31 DIAGNOSIS — Z79899 Other long term (current) drug therapy: Secondary | ICD-10-CM | POA: Diagnosis not present

## 2021-07-31 DIAGNOSIS — K8051 Calculus of bile duct without cholangitis or cholecystitis with obstruction: Secondary | ICD-10-CM | POA: Diagnosis not present

## 2021-07-31 DIAGNOSIS — K8013 Calculus of gallbladder with acute and chronic cholecystitis with obstruction: Secondary | ICD-10-CM | POA: Diagnosis present

## 2021-07-31 DIAGNOSIS — I10 Essential (primary) hypertension: Secondary | ICD-10-CM | POA: Diagnosis present

## 2021-07-31 DIAGNOSIS — Z888 Allergy status to other drugs, medicaments and biological substances status: Secondary | ICD-10-CM | POA: Diagnosis not present

## 2021-07-31 DIAGNOSIS — R1011 Right upper quadrant pain: Secondary | ICD-10-CM | POA: Diagnosis present

## 2021-07-31 DIAGNOSIS — Z8249 Family history of ischemic heart disease and other diseases of the circulatory system: Secondary | ICD-10-CM | POA: Diagnosis not present

## 2021-07-31 DIAGNOSIS — Z818 Family history of other mental and behavioral disorders: Secondary | ICD-10-CM | POA: Diagnosis not present

## 2021-07-31 DIAGNOSIS — Z833 Family history of diabetes mellitus: Secondary | ICD-10-CM | POA: Diagnosis not present

## 2021-07-31 DIAGNOSIS — F419 Anxiety disorder, unspecified: Secondary | ICD-10-CM

## 2021-07-31 DIAGNOSIS — E876 Hypokalemia: Secondary | ICD-10-CM | POA: Diagnosis present

## 2021-07-31 DIAGNOSIS — Z20822 Contact with and (suspected) exposure to covid-19: Secondary | ICD-10-CM | POA: Diagnosis present

## 2021-07-31 DIAGNOSIS — F908 Attention-deficit hyperactivity disorder, other type: Secondary | ICD-10-CM | POA: Diagnosis present

## 2021-07-31 LAB — COMPREHENSIVE METABOLIC PANEL
ALT: 262 U/L — ABNORMAL HIGH (ref 0–44)
AST: 361 U/L — ABNORMAL HIGH (ref 15–41)
Albumin: 3.6 g/dL (ref 3.5–5.0)
Alkaline Phosphatase: 79 U/L (ref 38–126)
Anion gap: 7 (ref 5–15)
BUN: 11 mg/dL (ref 6–20)
CO2: 23 mmol/L (ref 22–32)
Calcium: 8.4 mg/dL — ABNORMAL LOW (ref 8.9–10.3)
Chloride: 109 mmol/L (ref 98–111)
Creatinine, Ser: 0.68 mg/dL (ref 0.44–1.00)
GFR, Estimated: >60 mL/min (ref >60)
Glucose, Bld: 96 mg/dL (ref 70–99)
Potassium: 3.6 mmol/L (ref 3.5–5.1)
Sodium: 139 mmol/L (ref 135–145)
Total Bilirubin: 1.4 mg/dL — ABNORMAL HIGH (ref 0.3–1.2)
Total Protein: 6.7 g/dL (ref 6.5–8.1)

## 2021-07-31 LAB — CBC
HCT: 39.1 % (ref 36.0–46.0)
Hemoglobin: 12.6 g/dL (ref 12.0–15.0)
MCH: 27.3 pg (ref 26.0–34.0)
MCHC: 32.2 g/dL (ref 30.0–36.0)
MCV: 84.8 fL (ref 80.0–100.0)
Platelets: 274 10*3/uL (ref 150–400)
RBC: 4.61 MIL/uL (ref 3.87–5.11)
RDW: 14.2 % (ref 11.5–15.5)
WBC: 7.9 10*3/uL (ref 4.0–10.5)
nRBC: 0 % (ref 0.0–0.2)

## 2021-07-31 LAB — HIV ANTIBODY (ROUTINE TESTING W REFLEX): HIV Screen 4th Generation wRfx: NONREACTIVE

## 2021-07-31 LAB — RESP PANEL BY RT-PCR (FLU A&B, COVID) ARPGX2
Influenza A by PCR: NEGATIVE
Influenza B by PCR: NEGATIVE
SARS Coronavirus 2 by RT PCR: NEGATIVE

## 2021-07-31 LAB — SURGICAL PCR SCREEN
MRSA, PCR: NEGATIVE
Staphylococcus aureus: POSITIVE — AB

## 2021-07-31 LAB — MAGNESIUM: Magnesium: 1.8 mg/dL (ref 1.7–2.4)

## 2021-07-31 MED ORDER — MORPHINE SULFATE (PF) 2 MG/ML IV SOLN
2.0000 mg | INTRAVENOUS | Status: DC | PRN
  Administered 2021-07-31 – 2021-08-01 (×3): 2 mg via INTRAVENOUS
  Filled 2021-07-31 (×3): qty 1

## 2021-07-31 MED ORDER — PANTOPRAZOLE SODIUM 40 MG PO TBEC
40.0000 mg | DELAYED_RELEASE_TABLET | Freq: Every day | ORAL | Status: DC
  Administered 2021-07-31 – 2021-08-03 (×3): 40 mg via ORAL
  Filled 2021-07-31 (×3): qty 1

## 2021-07-31 MED ORDER — ACETAMINOPHEN 650 MG RE SUPP: 650.0000 mg | Freq: Four times a day (QID) | RECTAL | Status: DC | PRN

## 2021-07-31 MED ORDER — ONDANSETRON HCL 4 MG PO TABS: 4.0000 mg | ORAL_TABLET | Freq: Four times a day (QID) | ORAL | Status: DC | PRN

## 2021-07-31 MED ORDER — LURASIDONE HCL 40 MG PO TABS
60.0000 mg | ORAL_TABLET | Freq: Every day | ORAL | Status: DC
  Administered 2021-07-31 – 2021-08-01 (×2): 60 mg via ORAL
  Filled 2021-07-31: qty 1
  Filled 2021-07-31 (×5): qty 2

## 2021-07-31 MED ORDER — ONDANSETRON HCL 4 MG/2ML IJ SOLN
4.0000 mg | Freq: Four times a day (QID) | INTRAMUSCULAR | Status: DC | PRN
  Administered 2021-07-31 – 2021-08-02 (×3): 4 mg via INTRAVENOUS
  Filled 2021-07-31 (×3): qty 2

## 2021-07-31 MED ORDER — TRAZODONE HCL 100 MG PO TABS
200.0000 mg | ORAL_TABLET | Freq: Every day | ORAL | Status: DC
  Administered 2021-07-31 – 2021-08-02 (×3): 200 mg via ORAL
  Filled 2021-07-31 (×3): qty 2

## 2021-07-31 MED ORDER — SODIUM CHLORIDE 0.9 % IV SOLN
2.0000 g | INTRAVENOUS | Status: AC
  Administered 2021-07-31 – 2021-08-02 (×3): 2 g via INTRAVENOUS
  Filled 2021-07-31 (×2): qty 2
  Filled 2021-07-31: qty 20

## 2021-07-31 MED ORDER — SODIUM CHLORIDE 0.9 % IV SOLN: INTRAVENOUS | Status: DC

## 2021-07-31 MED ORDER — ACETAMINOPHEN 325 MG PO TABS
650.0000 mg | ORAL_TABLET | Freq: Four times a day (QID) | ORAL | Status: DC | PRN
  Administered 2021-08-03 (×2): 650 mg via ORAL
  Filled 2021-07-31 (×2): qty 2

## 2021-07-31 MED ORDER — ALPRAZOLAM 0.5 MG PO TABS
1.0000 mg | ORAL_TABLET | Freq: Three times a day (TID) | ORAL | Status: AC | PRN
  Administered 2021-07-31: 1 mg via ORAL
  Filled 2021-07-31 (×2): qty 2

## 2021-07-31 MED ORDER — MUPIROCIN 2 % EX OINT
1.0000 "application " | TOPICAL_OINTMENT | Freq: Two times a day (BID) | CUTANEOUS | Status: DC
  Administered 2021-07-31 – 2021-08-03 (×8): 1 via NASAL
  Filled 2021-07-31: qty 22

## 2021-07-31 MED ORDER — LABETALOL HCL 200 MG PO TABS
400.0000 mg | ORAL_TABLET | Freq: Two times a day (BID) | ORAL | Status: DC
  Administered 2021-07-31: 400 mg via ORAL
  Filled 2021-07-31 (×2): qty 2

## 2021-07-31 NOTE — Consult Note (Signed)
SURGICAL CONSULTATION NOTE   HISTORY OF PRESENT ILLNESS (HPI):  41 y.o. female presented to [facility] ED for evaluation of abdominal pain since yesterday morning. Pain localized in the right upper quadrant. Pain does not radiates. There is no alleviating or aggravating factors.   In the ED she was found with normal white blood cell count.  There was mild elevation of AST and ALT with normal bilirubin.  Ultrasound shows choledocholithiasis with dilated common bile duct.  I personally evaluated the images.  An MRCP was ordered and he was found with choledocholithiasis.  I also evaluated the images of the MRCP.  This morning the bilirubin increased to 1.4.  Patient was admitted for management of choledocholithiasis.  Gastroenterology was consulted and is planning to do ERCP tomorrow.  Surgery is consulted by Dr. Nevada Crane in this context for evaluation and management of choledocholithiasis for cholecystectomy after ERCP.  PAST MEDICAL HISTORY (PMH):  Past Medical History:  Diagnosis Date   ADHD, adult residual type    Anxiety    Depression    GERD (gastroesophageal reflux disease)    Hypertension    Wears contact lenses      PAST SURGICAL HISTORY ([place]):  Past Surgical History:  Procedure Laterality Date   BREAST REDUCTION SURGERY     COLONOSCOPY WITH PROPOFOL N/A 11/09/2016   Procedure: COLONOSCOPY WITH PROPOFOL;  Surgeon: Lucilla Lame, MD;  Location: [place];  Service: Endoscopy;  Laterality: N/A;   ESOPHAGOGASTRODUODENOSCOPY (EGD) WITH PROPOFOL N/A 11/09/2016   Procedure: ESOPHAGOGASTRODUODENOSCOPY (EGD) WITH PROPOFOL;  Surgeon: Lucilla Lame, MD;  Location: [place];  Service: Endoscopy;  Laterality: N/A;   TONSILLECTOMY       MEDICATIONS:  Prior to Admission medications   Medication Sig Start Date End Date Taking? Authorizing Provider  ALPRAZolam Duanne Moron) 1 MG tablet Take 1 mg by mouth 3 (three) times daily as needed for anxiety.    [provider]   amphetamine-dextroamphetamine (ADDERALL XR) 20 MG 24 hr capsule Take 20 mg by mouth daily.    [provider]  amphetamine-dextroamphetamine (ADDERALL) 20 MG tablet Take 20 mg by mouth 2 (two) times daily.    [provider]  ibuprofen (ADVIL) 600 MG tablet Take 1 tablet (600 mg total) by mouth every 6 (six) hours. Patient not taking: Reported on 07/18/2021 03/17/21   Imagene Riches, CNM  labetalol (NORMODYNE) 200 MG tablet Take 2 tablets (400 mg total) by mouth 2 (two) times daily. 04/28/21   Malachy Mood, MD  Lurasidone HCl 60 MG TABS Take 60 mg by mouth daily.    [provider]  norethindrone (MICRONOR) 0.35 MG tablet Take 1 tablet by mouth daily. Patient not taking: Reported on 07/18/2021 07/15/21   [provider]  pantoprazole (PROTONIX) 40 MG tablet TAKE 1 TABLET BY MOUTH EVERY DAY 02/18/21   Imagene Riches, CNM  traZODone (DESYREL) 100 MG tablet Take 200 mg by mouth at bedtime. 07/17/20   [provider]     ALLERGIES:  Allergies  Allergen Reactions   Ace Inhibitors Swelling    Lips/ face     SOCIAL HISTORY:  Social History   Socioeconomic History   Marital status: Married    Spouse name: Lewis   Number of children: 2   Years of education: Not on file   Highest education level: Not on file  Occupational History   Occupation: Scientist, research (medical)   Tobacco Use   Smoking status: Never   Smokeless tobacco: Never  Vaping Use  Vaping Use: Never used  Substance and Sexual Activity   Alcohol use: Not Currently    Alcohol/week: 10.0 standard drinks    Types: 5 Glasses of wine, 5 Cans of beer per week   Drug use: No   Sexual activity: Yes    Birth control/protection: None  Other Topics Concern   Not on file  Social History Narrative   Not on file   Social Determinants of Health   Financial Resource Strain: Not on file  Food Insecurity: Not on file  Transportation Needs: Not on file  Physical Activity: Not on file  Stress: Not  on file  Social Connections: Not on file  Intimate Partner Violence: Not on file      FAMILY HISTORY:  Family History  Problem Relation Age of Onset   Hypertension Mother    Hypertension Father    Anxiety disorder Sister    Bipolar disorder Sister    Anxiety disorder Sister    Heart disease Maternal Grandfather    Heart disease Maternal Grandmother    Diabetes Paternal Grandfather      REVIEW OF SYSTEMS:  Constitutional: denies weight loss, fever, chills, or sweats  Eyes: denies any other vision changes, history of eye injury  ENT: denies sore throat, hearing problems  Respiratory: denies shortness of breath, wheezing  Cardiovascular: denies chest pain, palpitations  Gastrointestinal: abdominal pain, nausea and vomiting Genitourinary: denies burning with urination or urinary frequency Musculoskeletal: denies any other joint pains or cramps  Skin: denies any other rashes or skin discolorations  Neurological: denies any other headache, dizziness, weakness  Psychiatric: denies any other depression, anxiety   All other review of systems were negative   VITAL SIGNS:  Temp:  [98.1 F (36.7 C)-98.8 F (37.1 C)] 98.8 F (37.1 C) (02/05 0741) Pulse Rate:  [56-66] 56 (02/05 0741) Resp:  [16-20] 17 (02/05 0741) BP: (115-145)/(50-79) 115/50 (02/05 0741) SpO2:  [94 %-99 %] 97 % (02/05 0741)             INTAKE/OUTPUT:  This shift: No intake/output data recorded.  Last 2 shifts: @IOLAST2SHIFTS @   PHYSICAL EXAM:  Constitutional:  -- Normal body habitus  -- Awake, alert, and oriented x3  Eyes:  -- Pupils equally round and reactive to light  -- No scleral icterus  Ear, nose, and throat:  -- No jugular venous distension  Pulmonary:  -- No crackles  -- Equal breath sounds bilaterally -- Breathing non-labored at rest Cardiovascular:  -- S1, S2 present  -- No pericardial rubs Gastrointestinal:  -- Abdomen soft, nontender, non-distended, no guarding or rebound tenderness --  No abdominal masses appreciated, pulsatile or otherwise  Musculoskeletal and Integumentary:  -- Wounds: None appreciated -- Extremities: B/L UE and LE FROM, hands and feet warm, no edema  Neurologic:  -- Motor function: intact and symmetric -- Sensation: intact and symmetric   Labs:  CBC Latest Ref Rng & Units 07/31/2021 07/30/2021 03/16/2021  WBC 4.0 - 10.5 K/uL 7.9 16.2(H) 19.7(H)  Hemoglobin 12.0 - 15.0 g/dL 93.7 16.9 10.1(L)  Hematocrit 36.0 - 46.0 % 39.1 42.4 29.9(L)  Platelets 150 - 400 K/uL 274 328 220   CMP Latest Ref Rng & Units 07/31/2021 07/30/2021 03/15/2021  Glucose 70 - 99 mg/dL 96 678(L) 93  BUN 6 - 20 mg/dL 11 13 9   Creatinine 0.44 - 1.00 mg/dL 3.81 0.17 5.10  Sodium 135 - 145 mmol/L 139 135 135  Potassium 3.5 - 5.1 mmol/L 3.6 3.3(L) 3.9  Chloride 98 - 111  mmol/L 109 107 104  CO2 22 - 32 mmol/L 23 22 22   Calcium 8.9 - 10.3 mg/dL 8.4(L) 9.2 9.2  Total Protein 6.5 - 8.1 g/dL 6.7 7.6 6.6  Total Bilirubin 0.3 - 1.2 mg/dL 1.4(H) 1.0 0.5  Alkaline Phos 38 - 126 U/L 79 64 84  AST 15 - 41 U/L 361(H) 96(H) 18  ALT 0 - 44 U/L 262(H) 62(H) 13    Imaging studies:  EXAM: MRI ABDOMEN WITHOUT CONTRAST  (INCLUDING MRCP)   TECHNIQUE: Multiplanar multisequence MR imaging of the abdomen was performed. Heavily T2-weighted images of the biliary and pancreatic ducts were obtained, and three-dimensional MRCP images were rendered by post processing.   COMPARISON:  Ultrasound exam earlier same day. CT stone study 03/09/2018.   FINDINGS: Lower chest: Unremarkable.   Hepatobiliary: No suspicious focal abnormality within the liver parenchyma. Multiple tiny gallstones evident in the gallbladder demonstrating a prominent fold. Most of the stones measure in the 4-7 mm size range. No intrahepatic biliary duct dilatation. Extrahepatic common bile duct measures 5-6 mm. There does appear to be a tiny 2-3 mm dependent stone in the distal common bile duct (axial T2 image 19 of series 9 in best  demonstrated on 3D MRCP image 96 of series 18). No gallbladder wall thickening. Possible trace pericholecystic edema.   Pancreas: No focal mass lesion. No dilatation of the main duct. No intraparenchymal cyst. No peripancreatic edema.   Spleen:  No splenomegaly. No focal mass lesion.   Adrenals/Urinary Tract: No adrenal nodule or mass. Kidneys unremarkable.   Stomach/Bowel: Stomach is unremarkable. No gastric wall thickening. No evidence of outlet obstruction. Duodenum is normally positioned as is the ligament of Treitz. No small bowel or colonic dilatation within the visualized abdomen.   Vascular/Lymphatic: No abdominal aortic aneurysm. 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 There is no gastrohepatic or hepatoduodenal ligament lymphadenopathy. No retroperitoneal or mesenteric lymphadenopathy.   Other:  No intraperitoneal free fluid.   Musculoskeletal: No suspicious marrow signal abnormality.   IMPRESSION: 1. Cholelithiasis with a prominent fold in the gallbladder. Possible trace pericholecystic edema. No gallbladder wall thickening. 2. Tiny 2-3 mm stone in the distal common bile duct. No intrahepatic biliary duct dilatation. 3. 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     Electronically Signed   By: Misty Stanley M.D.   On: 07/31/2021 05:14  Assessment/Plan:  41 y.o. female with cholelithiasis, complicated by pertinent comorbidities including obesity.  Choledocholithiasis -Gastroenterology consulted and is planning to do ERCP tomorrow -I had a long discussion with the patient about the recommendation of cholecystectomy after resolution of choledocholithiasis before discharge.  I discussed with the patient the implication of choledocholithiasis and the its high recurrence if cholecystectomy signs and -Patient is aware she understood. -We will follow-up tomorrow after ERCP for final discussion of cholecystectomy.   Arnold Long,  MD

## 2021-07-31 NOTE — Assessment & Plan Note (Signed)
N.p.o. IV pain meds and antiemetics GI consulted for ERCP

## 2021-07-31 NOTE — Assessment & Plan Note (Addendum)
As needed IV Ativan while n.p.o. resume home Xanax when appropriate

## 2021-07-31 NOTE — ED Notes (Signed)
Patient provided with her Xanax per Dr Lianne Bushy request now. Patient resting comfortably on stretcher. RR even and unlabored. Patient verbalizes no needs or complaints at this time.

## 2021-07-31 NOTE — Plan of Care (Signed)
  Problem: Health Behavior/Discharge Planning: Goal: Ability to manage health-related needs will improve Outcome: Progressing   Problem: Clinical Measurements: Goal: Will remain free from infection Outcome: Progressing   

## 2021-07-31 NOTE — H&P (Signed)
History and Physical    Patient: Brenda [name] ENM:[id number] DOB: 10-30-1980 DOA: 07/30/2021 DOS: the patient was seen and examined on 07/31/2021 PCP: Duanne Limerick, MD  Patient coming from: Home  Chief Complaint:  Chief Complaint  Patient presents with   Abdominal Pain    HPI: Brenda [name] is a 41 y.o. female with medical history significant of HTN and anxiety who presents to the ED with right upper quadrant abdominal pain that started around 3 PM.  It was associated with a single episode of nonbloody nonbilious vomiting.  She denies fever or chills.  Patient states that she had a similar episode 2 years prior and work-up was negative and she has intermittently had right upper quadrant pain since then.  She denies chest pain, cough or shortness of breath  ED course: On arrival BP 145/79 with otherwise normal vitals Blood work significant for WBC of 16,000.  Lipase normal, LFTs with AST 96 and ALT 62.  Potassium 3.3 Urinalysis with many bacteria  EKG, personally viewed and interpreted: Sinus at 120 with no acute ST-T wave elevation  Right upper quadrant sonogram: Cholelithiasis without additional evidence of acute cholecystitis.  CBD diameter 7 mm MRCP: Verbal report to ED provider from radiology with possible distal stone  The ED provider initially contacted surgery who recommended MRCP .  GI was subsequently contacted with above findings and they recommended admission for ERCP in the a.m.  Hospitalist consulted for admission.   Review of Systems: As mentioned in the history of present illness. All other systems reviewed and are negative. Past Medical History:  Diagnosis Date   ADHD, adult residual type    Anxiety    Depression    GERD (gastroesophageal reflux disease)    Hypertension    Wears contact lenses    Past Surgical History:  Procedure Laterality Date   BREAST REDUCTION SURGERY     COLONOSCOPY WITH PROPOFOL N/A 11/09/2016   Procedure: COLONOSCOPY WITH  PROPOFOL;  Surgeon: [name] Minium, MD;  [facility];  Service: Endoscopy;  Laterality: N/A;   ESOPHAGOGASTRODUODENOSCOPY (EGD) WITH PROPOFOL N/A 11/09/2016   Procedure: ESOPHAGOGASTRODUODENOSCOPY (EGD) WITH PROPOFOL;  Surgeon: [name] Minium, MD;  Location: [facility];  Service: Endoscopy;  Laterality: N/A;   TONSILLECTOMY     Social History:  reports that she has never smoked. She has never used smokeless tobacco. She reports that she does not currently use alcohol after a past usage of about 10.0 standard drinks per week. She reports that she does not use drugs.  Allergies  Allergen Reactions   Ace Inhibitors Swelling    Lips/ face    Family History  Problem Relation Age of Onset   Hypertension Mother    Hypertension Father    Anxiety disorder Sister    Bipolar disorder Sister    Anxiety disorder Sister    Heart disease Maternal Grandfather    Heart disease Maternal Grandmother    Diabetes Paternal Grandfather     Prior to Admission medications   Medication Sig Start Date End Date Taking? Authorizing Provider  ALPRAZolam Prudy Feeler) 1 MG tablet Take 1 mg by mouth 3 (three) times daily as needed for anxiety.    [provider]  amphetamine-dextroamphetamine (ADDERALL XR) 20 MG 24 hr capsule Take 20 mg by mouth daily.    [provider]  amphetamine-dextroamphetamine (ADDERALL) 20 MG tablet Take 20 mg by mouth 2 (two) times daily.    [provider]  ibuprofen (ADVIL) 600 MG  tablet Take 1 tablet (600 mg total) by mouth every 6 (six) hours. Patient not taking: Reported on 07/18/2021 03/17/21   Imagene Riches, CNM  labetalol (NORMODYNE) 200 MG tablet Take 2 tablets (400 mg total) by mouth 2 (two) times daily. 04/28/21   Malachy Mood, MD  Lurasidone HCl 60 MG TABS Take 60 mg by mouth daily.    [provider]  norethindrone (MICRONOR) 0.35 MG tablet Take 1 tablet by mouth daily. Patient not taking: Reported on 07/18/2021  07/15/21   [provider]  pantoprazole (PROTONIX) 40 MG tablet TAKE 1 TABLET BY MOUTH EVERY DAY 02/18/21   Imagene Riches, CNM  traZODone (DESYREL) 100 MG tablet Take 200 mg by mouth at bedtime. 07/17/20   [provider]    Physical Exam: Vitals:   07/30/21 2002 07/30/21 2130 07/30/21 2200 07/30/21 2230  BP: (!) 145/79 131/77 122/66 (!) 115/59  Pulse: 64 66 62 64  Resp: 20 18 16 18   Temp: 98.1 F (36.7 C)     TempSrc: Oral     SpO2: 99% 94% 97% 97%   Constitutional: Alert, oriented x 3 . Not in any apparent distress HEENT:      Head: Normocephalic and atraumatic.         Eyes: PERLA, EOMI, Conjunctivae are normal. Sclera is non-icteric.       Mouth/Throat: Mucous membranes are moist.       Neck: Supple with no signs of meningismus. Cardiovascular: Regular rate and rhythm. No murmurs, gallops, or rubs. 2+ symmetrical distal pulses are present . No JVD. No  LE edema Respiratory: Respiratory effort normal .Lungs sounds clear bilaterally. No wheezes, crackles, or rhonchi.  Gastrointestinal: Soft, non tender, non distended. Positive bowel sounds.  Genitourinary: No CVA tenderness. Musculoskeletal: Nontender with normal range of motion in all extremities. No cyanosis, or erythema of extremities. Neurologic:  Face is symmetric. Moving all extremities. No gross focal neurologic deficits . Skin: Skin is warm, dry.  No rash or ulcers Psychiatric: Mood and affect are appropriate    Data Reviewed:  Notes from primary care and specialist visits, past discharge summaries. Prior diagnostic testing as applicable to current admission diagnoses Updated medications and problem lists for reconciliation ED course, including vitals, labs, imaging, treatment and response to treatment Triage notes and ED providers notes  Assessment and Plan: * Choledocholithiasis with obstruction N.p.o. IV pain meds and antiemetics GI consulted for ERCP   Hypokalemia Replete and  monitor  Anxiety As needed IV Ativan while n.p.o. resume home Xanax when appropriate  Essential hypertension- (present on admission) IV hydralazine as needed for BP control, while n.p.o. Resume home labetalol when appropriate       Advance Care Planning:   Code Status: Full Code   Consults: GI  Family Communication: none  Severity of Illness: The appropriate patient status for this patient is INPATIENT. Inpatient status is judged to be reasonable and necessary in order to provide the required intensity of service to ensure the patient's safety. The patient's presenting symptoms, physical exam findings, and initial radiographic and laboratory data in the context of their chronic comorbidities is felt to place them at high risk for further clinical deterioration. Furthermore, it is not anticipated that the patient will be medically stable for discharge from the hospital within 2 midnights of admission.   * I certify that at the point of admission it is my clinical judgment that the patient will require inpatient hospital care spanning beyond 2 midnights from the  point of admission due to high intensity of service, high risk for further deterioration and high frequency of surveillance required.*  Author: Athena Masse, MD 07/31/2021 12:58 AM  For on call review [email or web address].

## 2021-07-31 NOTE — Assessment & Plan Note (Addendum)
IV hydralazine as needed for BP control, while n.p.o. Resume home labetalol when appropriate

## 2021-07-31 NOTE — Plan of Care (Signed)
  Problem: Health Behavior/Discharge Planning: Goal: Ability to manage health-related needs will improve Outcome: Progressing   Problem: Clinical Measurements: Goal: Ability to maintain clinical measurements within normal limits will improve Outcome: Progressing   

## 2021-07-31 NOTE — Assessment & Plan Note (Signed)
Resolved with repletion 

## 2021-07-31 NOTE — Consult Note (Addendum)
GI Inpatient Consult Note  Reason for Consult: Choledocholithiasis    Attending Requesting Consult: Dr. Merlyn Lot, MD  History of Present Illness: Brenda [name] is a 41 y.o. female seen for evaluation of choledocholithiasis at the request of ED physician - Dr. Merlyn Lot. Patient has a PMH of HTN, depression, anxiety, ADHD, and GERD. She presented to the [facility] ED yesterday evening for chief complaint of acute RUQ abdominal pain which started around 3 PM yesterday afternoon. There was associated nausea and single episode of non-bloody and non-bilious emesis. She had similar symptoms about 2 years ago and reports work-up was negative at this time. She has had intermittent RUQ abdominal pain since this time. Upon presentation to the ED, she was mildly hypertensive with BP 145/79 and otherwise stable vital signs. Labs were significant for WBC 16.2K, AST 96, ALT 62. RUQ US showed cholelithiasis without additional evidence of acute cholecystitis. General Surgery was called overnight and recommended MRCP to rule out bile duct stone. MRCP without contrast showed 2-3 mm distal stone in CBD with no intrahepatic biliary dilatation and commented on trace pericholecystic fluid. She was started on antiemetics and pain control. GI consulted for further evaluation and management.   Patient seen and examined this morning resting comfortably in hospital bed. She denies any acute overnight events. She reports her pain is currently well-controlled. She rates pain currently 0/10. She denies any fevers, chills, or altered mental status. Labs this morning showed worsening LFTs with AST 361, ALT 262, and tbili 1.4. Her leukocytosis has resolved - WBC 7.9K. She denies any changes in her bowel habits. She denies any hematochezia or melena. She lives at home with her husband and 2 children - 4 month old and 79-year old. She denies frequent NSAID use. Of note, she is s/p vaginal delivery 02/2021. She denies any  esophageal dysphagia, odynophagia, early satiety, or hoarseness. EGD and colonoscopy performed by Dr. Allen Norris 10/2016 showed irregular Z-line, normal stomach, and normal duodenum. Colonoscopy showed 2 mm hyperplastic polyp removed from rectum and Grade II non-bleeding internal hemorrhoids.    Past Medical History:  Past Medical History:  Diagnosis Date   ADHD, adult residual type    Anxiety    Depression    GERD (gastroesophageal reflux disease)    Hypertension    Wears contact lenses     Problem List: Patient Active Problem List   Diagnosis Date Noted   Anxiety 07/31/2021   Hypokalemia 07/31/2021   Choledocholithiasis with obstruction 07/31/2021   Bipolar disorder in full remission ([place]) 07/18/2021   Panic attacks 07/18/2021   History of ADHD 07/18/2021   Significant use of alcohol 07/18/2021   Postpartum care following vaginal delivery 03/17/2021   Chronic benign essential hypertension, antepartum 03/15/2021   Pre-existing essential hypertension during pregnancy, antepartum 08/05/2020   Gastroesophageal reflux disease 07/10/2019   Essential hypertension 07/10/2019   Performance anxiety 07/10/2019   Epigastric abdominal pain 03/10/2018   Rectal polyp    Heartburn    Nausea    Adult ADHD 12/07/2014    Past Surgical History: Past Surgical History:  Procedure Laterality Date   BREAST REDUCTION SURGERY     COLONOSCOPY WITH PROPOFOL N/A 11/09/2016   Procedure: COLONOSCOPY WITH PROPOFOL;  Surgeon: Lucilla Lame, MD;  Location: [place];  Service: Endoscopy;  Laterality: N/A;   ESOPHAGOGASTRODUODENOSCOPY (EGD) WITH PROPOFOL N/A 11/09/2016   Procedure: ESOPHAGOGASTRODUODENOSCOPY (EGD) WITH PROPOFOL;  Surgeon: Lucilla Lame, MD;  Location: [place];  Service: Endoscopy;  Laterality: N/A;  TONSILLECTOMY      Allergies: Allergies  Allergen Reactions   Ace Inhibitors Swelling    Lips/ face    Home Medications: Medications Prior to Admission  Medication Sig  Dispense Refill Last Dose   ALPRAZolam (XANAX) 1 MG tablet Take 1 mg by mouth 3 (three) times daily as needed for anxiety.      amphetamine-dextroamphetamine (ADDERALL XR) 20 MG 24 hr capsule Take 20 mg by mouth daily.      amphetamine-dextroamphetamine (ADDERALL) 20 MG tablet Take 20 mg by mouth 2 (two) times daily.      ibuprofen (ADVIL) 600 MG tablet Take 1 tablet (600 mg total) by mouth every 6 (six) hours. (Patient not taking: Reported on 07/18/2021) 30 tablet 0    labetalol (NORMODYNE) 200 MG tablet Take 2 tablets (400 mg total) by mouth 2 (two) times daily. 120 tablet 11    Lurasidone HCl 60 MG TABS Take 60 mg by mouth daily.      norethindrone (MICRONOR) 0.35 MG tablet Take 1 tablet by mouth daily. (Patient not taking: Reported on 07/18/2021)      pantoprazole (PROTONIX) 40 MG tablet TAKE 1 TABLET BY MOUTH EVERY DAY 30 tablet 5    traZODone (DESYREL) 100 MG tablet Take 200 mg by mouth at bedtime.      Home medication reconciliation was completed with the patient.   Scheduled Inpatient Medications:    mupirocin ointment  1 application Nasal BID    Continuous Inpatient Infusions:    sodium chloride 100 mL/hr at 07/31/21 0147    PRN Inpatient Medications:  acetaminophen **OR** acetaminophen, morphine injection, morphine injection, ondansetron **OR** ondansetron (ZOFRAN) IV  Family History: family history includes Anxiety disorder in her sister and sister; Bipolar disorder in her sister; Diabetes in her paternal grandfather; Heart disease in her maternal grandfather and maternal grandmother; Hypertension in her father and mother.  The patient's family history is negative for inflammatory bowel disorders, GI malignancy, or solid organ transplantation.  Social History:   reports that she has never smoked. She has never used smokeless tobacco. She reports that she does not currently use alcohol after a past usage of about 10.0 standard drinks per week. She reports that she does not use  drugs. The patient denies ETOH, tobacco, or drug use.   Review of Systems: Constitutional: Weight is stable.  Eyes: No changes in vision. ENT: No oral lesions, sore throat.  GI: see HPI.  Heme/Lymph: No easy bruising.  CV: No chest pain.  GU: No hematuria.  Integumentary: No rashes.  Neuro: No headaches.  Psych: No depression/anxiety.  Endocrine: No heat/cold intolerance.  Allergic/Immunologic: No urticaria.  Resp: No cough, SOB.  Musculoskeletal: No joint swelling.    Physical Examination: BP (!) 115/50 (BP Location: Left Arm)    Pulse (!) 56    Temp 98.8 F (37.1 C) (Oral)    Resp 17    LMP 07/11/2021    SpO2 97%  Gen: NAD, alert and oriented x 4 HEENT: PEERLA, EOMI, Neck: supple, no JVD or thyromegaly Chest: CTA bilaterally, no wheezes, crackles, or other adventitious sounds CV: RRR, no m/g/c/r Abd: soft, NT, ND, +BS in all four quadrants; no HSM, guarding, ridigity, or rebound tenderness Ext: no edema, well perfused with 2+ pulses, Skin: no rash or lesions noted Lymph: no LAD  Data: Lab Results  Component Value Date   WBC 7.9 07/31/2021   HGB 12.6 07/31/2021   HCT 39.1 07/31/2021   MCV 84.8 07/31/2021   PLT  274 07/31/2021   Recent Labs  Lab 07/30/21 2108 07/31/21 0513  HGB 13.5 12.6   Lab Results  Component Value Date   NA 139 07/31/2021   K 3.6 07/31/2021   CL 109 07/31/2021   CO2 23 07/31/2021   BUN 11 07/31/2021   CREATININE 0.68 07/31/2021   Lab Results  Component Value Date   ALT 262 (H) 07/31/2021   AST 361 (H) 07/31/2021   ALKPHOS 79 07/31/2021   BILITOT 1.4 (H) 07/31/2021   No results for input(s): APTT, INR, PTT in the last 168 hours.  RUQ [place] 07/30/2021: IMPRESSION: Cholelithiasis without additional evidence of acute cholecystitis.  MRCP without contrast 07/30/2021: IMPRESSION: 1. Cholelithiasis with a prominent fold in the gallbladder. Possible trace pericholecystic edema. No gallbladder wall thickening. 2. Tiny 2-3 mm stone in the  distal common bile duct. No intrahepatic biliary duct dilatation. 3. 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   Assessment/Plan:  41 y/o Caucasian female with a PMH of HTN and anxiety presented to the [facility] ED last night for acute onset of RUQ abdominal pain, nausea, and single episode of emesis found to have choledocholithiasis. GI consulted for further evaluation and management.   Choledocholithiasis - clinical presentation, labs, and imaging c/w diagnosis of choledocholithiasis without evidence of cholangitis (no fever, no AMS). MRCP confirmed 2-3 mm distal CBD stone.   Hypokalemia - repleted  Anxiety - stable  Recommendations:  - Discussed diagnosis of choledocholithiasis with patient in hospital room today - Continue to monitor serial LFTs - Continue serial abdominal examinations. No evidence of cholangitis.  - Continue supportive control with pain control and anti-emetics as needed - Advise ERCP for biliary drainage and stone extraction. Discussed procedure details and indications in room today as well as potential risks and complications, including post-ERCP pancreatitis, bleeding, infection, anesthesia complications, or cardiovascular complications. Patient consents to proceed.  - Recommend Surgery consult for likely cholecystectomy after ERCP - Will coordinate timing of procedure with Dr. Allen Norris  - Low fat diet. NPO after midnight.  - Following along with you   Thank you for the consult. Please call with questions or concerns.  Reeves Forth [facility] [phone] (Cell)

## 2021-07-31 NOTE — Progress Notes (Signed)
Brenda  Busing [name] is a 41 y.o. female with medical history significant of HTN, chronic anxiety/depression, ADHD, who presents to [facility] ED with sudden onset right upper quadrant abdominal pain that started around 3 PM on the day of presentation to the ED.  It was associated with a single episode of nonbloody nonbilious emesis.  Work-up revealed cholelithiasis without evidence of acute cholecystitis and common bile duct diameter of 7 mm.  Due to concern for choledocholithiasis MRCP was obtained which revealed possible distal stone.  GI was consulted and plan is to perform ERCP on 08/01/2021.    General surgery was also consulted to consider elective cholecystectomy while inpatient to avoid recurrent symptomatic cholelithiasis/choledocholithiasis.  No evidence of acute cholangitis.  However patient reports that she is symptomatic from pyuria.  States she has a history of urinary tract infection and her symptoms are lower back pain.  Endorses lower back pain at the time of this visit.  Urine culture obtained, started on Rocephin 2 g daily x3 days for presumptive UTI.  Please refer to H&P dictated by my partner Dr. Damita Dunnings on 07/31/2021 for further details of the assessment and plan.

## 2021-08-01 ENCOUNTER — Inpatient Hospital Stay: Payer: BC Managed Care – PPO | Admitting: Certified Registered"

## 2021-08-01 ENCOUNTER — Inpatient Hospital Stay: Payer: BC Managed Care – PPO

## 2021-08-01 ENCOUNTER — Encounter: Payer: Self-pay | Admitting: Internal Medicine

## 2021-08-01 ENCOUNTER — Encounter
Admission: EM | Disposition: A | Discharge status: Home / Self Care | Payer: Self-pay | Source: Home / Self Care | Attending: Internal Medicine

## 2021-08-01 ENCOUNTER — Other Ambulatory Visit: Payer: Self-pay

## 2021-08-01 DIAGNOSIS — K8051 Calculus of bile duct without cholangitis or cholecystitis with obstruction: Secondary | ICD-10-CM | POA: Diagnosis not present

## 2021-08-01 HISTORY — PX: ERCP: SHX5425

## 2021-08-01 LAB — CBC WITH DIFFERENTIAL/PLATELET
Abs Immature Granulocytes: 0.02 10*3/uL (ref 0.00–0.07)
Basophils Absolute: 0.1 10*3/uL (ref 0.0–0.1)
Basophils Relative: 1 %
Eosinophils Absolute: 0.4 10*3/uL (ref 0.0–0.5)
Eosinophils Relative: 6 %
HCT: 37.7 % (ref 36.0–46.0)
Hemoglobin: 12.2 g/dL (ref 12.0–15.0)
Immature Granulocytes: 0 %
Lymphocytes Relative: 26 %
Lymphs Abs: 1.9 10*3/uL (ref 0.7–4.0)
MCH: 27.7 pg (ref 26.0–34.0)
MCHC: 32.4 g/dL (ref 30.0–36.0)
MCV: 85.7 fL (ref 80.0–100.0)
Monocytes Absolute: 0.6 10*3/uL (ref 0.1–1.0)
Monocytes Relative: 8 %
Neutro Abs: 4.2 10*3/uL (ref 1.7–7.7)
Neutrophils Relative %: 59 %
Platelets: 271 10*3/uL (ref 150–400)
RBC: 4.4 MIL/uL (ref 3.87–5.11)
RDW: 14.3 % (ref 11.5–15.5)
WBC: 7.2 10*3/uL (ref 4.0–10.5)
nRBC: 0 % (ref 0.0–0.2)

## 2021-08-01 LAB — COMPREHENSIVE METABOLIC PANEL
ALT: 221 U/L — ABNORMAL HIGH (ref 0–44)
AST: 134 U/L — ABNORMAL HIGH (ref 15–41)
Albumin: 3.4 g/dL — ABNORMAL LOW (ref 3.5–5.0)
Alkaline Phosphatase: 90 U/L (ref 38–126)
Anion gap: 5 (ref 5–15)
BUN: 12 mg/dL (ref 6–20)
CO2: 23 mmol/L (ref 22–32)
Calcium: 8.3 mg/dL — ABNORMAL LOW (ref 8.9–10.3)
Chloride: 109 mmol/L (ref 98–111)
Creatinine, Ser: 0.84 mg/dL (ref 0.44–1.00)
GFR, Estimated: >60 mL/min (ref >60)
Glucose, Bld: 105 mg/dL — ABNORMAL HIGH (ref 70–99)
Potassium: 3.7 mmol/L (ref 3.5–5.1)
Sodium: 137 mmol/L (ref 135–145)
Total Bilirubin: 0.6 mg/dL (ref 0.3–1.2)
Total Protein: 6.5 g/dL (ref 6.5–8.1)

## 2021-08-01 LAB — URINE CULTURE: Culture: NO GROWTH

## 2021-08-01 LAB — MAGNESIUM: Magnesium: 1.9 mg/dL (ref 1.7–2.4)

## 2021-08-01 LAB — PHOSPHORUS: Phosphorus: 3.5 mg/dL (ref 2.5–4.6)

## 2021-08-01 SURGERY — ERCP, WITH INTERVENTION IF INDICATED
Anesthesia: General

## 2021-08-01 MED ORDER — GLYCOPYRROLATE 0.2 MG/ML IJ SOLN
INTRAMUSCULAR | Status: DC | PRN
  Administered 2021-08-01: .2 mg via INTRAVENOUS

## 2021-08-01 MED ORDER — ROCURONIUM BROMIDE 100 MG/10ML IV SOLN
INTRAVENOUS | Status: DC | PRN
Start: 2021-08-01 — End: 2021-08-01
  Administered 2021-08-01: 40 mg via INTRAVENOUS

## 2021-08-01 MED ORDER — SUGAMMADEX SODIUM 200 MG/2ML IV SOLN
INTRAVENOUS | Status: DC | PRN
  Administered 2021-08-01: 200 mg via INTRAVENOUS

## 2021-08-01 MED ORDER — GLYCOPYRROLATE 0.2 MG/ML IJ SOLN
INTRAMUSCULAR | Status: AC
  Filled 2021-08-01: qty 1

## 2021-08-01 MED ORDER — FENTANYL CITRATE (PF) 100 MCG/2ML IJ SOLN
INTRAMUSCULAR | Status: DC | PRN
  Administered 2021-08-01: 100 ug via INTRAVENOUS

## 2021-08-01 MED ORDER — PROPOFOL 500 MG/50ML IV EMUL
INTRAVENOUS | Status: AC
  Filled 2021-08-01: qty 50

## 2021-08-01 MED ORDER — PROPOFOL 500 MG/50ML IV EMUL
INTRAVENOUS | Status: DC | PRN
Start: 2021-08-01 — End: 2021-08-01
  Administered 2021-08-01: 120 ug/kg/min via INTRAVENOUS

## 2021-08-01 MED ORDER — SUCCINYLCHOLINE CHLORIDE 200 MG/10ML IV SOSY
PREFILLED_SYRINGE | INTRAVENOUS | Status: AC
  Filled 2021-08-01: qty 10

## 2021-08-01 MED ORDER — INDOMETHACIN 50 MG RE SUPP
RECTAL | Status: AC
  Filled 2021-08-01: qty 2

## 2021-08-01 MED ORDER — PROMETHAZINE HCL 25 MG/ML IJ SOLN: 6.2500 mg | INTRAMUSCULAR | Status: DC | PRN

## 2021-08-01 MED ORDER — LACTATED RINGERS IV SOLN: INTRAVENOUS | Status: DC

## 2021-08-01 MED ORDER — PROPOFOL 10 MG/ML IV BOLUS
INTRAVENOUS | Status: DC | PRN
  Administered 2021-08-01: 200 mg via INTRAVENOUS

## 2021-08-01 MED ORDER — PROPOFOL 10 MG/ML IV BOLUS
INTRAVENOUS | Status: AC
  Filled 2021-08-01: qty 20

## 2021-08-01 MED ORDER — ONDANSETRON HCL 4 MG/2ML IJ SOLN
INTRAMUSCULAR | Status: AC
  Filled 2021-08-01: qty 2

## 2021-08-01 MED ORDER — SODIUM CHLORIDE 0.9 % IV SOLN: INTRAVENOUS | Status: DC

## 2021-08-01 MED ORDER — LACTATED RINGERS IV BOLUS
500.0000 mL | Freq: Once | INTRAVENOUS | Status: AC
  Administered 2021-08-01: 500 mL via INTRAVENOUS

## 2021-08-01 MED ORDER — HYDROMORPHONE HCL 1 MG/ML IJ SOLN: 1.0000 mg | INTRAMUSCULAR | Status: DC | PRN

## 2021-08-01 MED ORDER — FENTANYL CITRATE PF 50 MCG/ML IJ SOSY: 25.0000 ug | PREFILLED_SYRINGE | INTRAMUSCULAR | Status: DC | PRN

## 2021-08-01 MED ORDER — ONDANSETRON HCL 4 MG/2ML IJ SOLN
INTRAMUSCULAR | Status: DC | PRN
  Administered 2021-08-01: 4 mg via INTRAVENOUS

## 2021-08-01 MED ORDER — MIDAZOLAM HCL 2 MG/2ML IJ SOLN
INTRAMUSCULAR | Status: DC | PRN
  Administered 2021-08-01: 2 mg via INTRAVENOUS

## 2021-08-01 MED ORDER — MIDAZOLAM HCL 2 MG/2ML IJ SOLN
INTRAMUSCULAR | Status: AC
  Filled 2021-08-01: qty 2

## 2021-08-01 MED ORDER — HYDROMORPHONE HCL 1 MG/ML IJ SOLN
0.5000 mg | INTRAMUSCULAR | Status: DC | PRN
  Administered 2021-08-01 – 2021-08-02 (×2): 0.5 mg via INTRAVENOUS
  Filled 2021-08-01 (×2): qty 0.5

## 2021-08-01 MED ORDER — FENTANYL CITRATE (PF) 100 MCG/2ML IJ SOLN
INTRAMUSCULAR | Status: AC
  Filled 2021-08-01: qty 2

## 2021-08-01 MED ORDER — SUCCINYLCHOLINE CHLORIDE 200 MG/10ML IV SOSY
PREFILLED_SYRINGE | INTRAVENOUS | Status: DC | PRN
  Administered 2021-08-01: 110 mg via INTRAVENOUS

## 2021-08-01 MED ORDER — ROCURONIUM BROMIDE 10 MG/ML (PF) SYRINGE
PREFILLED_SYRINGE | INTRAVENOUS | Status: AC
  Filled 2021-08-01: qty 10

## 2021-08-01 NOTE — Progress Notes (Signed)
PROGRESS NOTE  Brenda [name] [id number] DOB: [birth year] DOA: 07/30/2021 PCP: Juline Patch, MD  HPI/Recap of past 24 hours: [name] is a 41 y.o. female with medical history significant of HTN, chronic anxiety/depression, ADHD, who presents to [facility] ED with sudden onset right upper quadrant abdominal pain that started around 3 PM on the day of presentation to the ED.  It was associated with a single episode of nonbloody nonbilious emesis.  Work-up revealed cholelithiasis without evidence of acute cholecystitis and common bile duct diameter of 7 mm.  Due to concern for choledocholithiasis MRCP was obtained which revealed possible distal stone.  GI was consulted and plan is to perform ERCP on 08/01/2021.     General surgery was also consulted to consider elective cholecystectomy while inpatient to avoid recurrent symptomatic cholelithiasis/choledocholithiasis.  No evidence of acute cholangitis.  However patient reports that she is symptomatic from pyuria.  States she has a history of urinary tract infection and her symptoms are lower back pain.  Endorses lower back pain at the time of this visit.  Urine culture obtained, started on Rocephin 2 g daily x3 days for presumptive UTI.  08/01/2021: Patient was seen and examined at bedside.  She endorses 3 out of 10 right upper quadrant abdominal pain improved after pain medication.  She denies any nausea at the time of this visit.  Plan for ERCP.  Assessment/Plan: Principal Problem:   Choledocholithiasis with obstruction Active Problems:   Essential hypertension   Anxiety   Hypokalemia  Symptomatic choledocholithiasis with obstruction Presented with right upper quadrant abdominal pain, nausea and vomiting. Work-up revealed choledocholithiasis with transaminitis. Plan ERCP on 08/01/2021 General surgery has been consulted, possible elective cholecystectomy. Continue pain management and bowel regimen. Continue IV fluid hydration    Essential hypertension BP is at goal without oral antihypertensives Continue to monitor   Resolved post repletion: Hypokalemia Replete electrolytes as indicated.   Chronic anxiety  Continue home regimen  ADHD Continue to hold off home Adderall   Morbid obesity BMI 40 Recommend weight loss outpatient with healthy dieting and regular physical activity   GERD Continue home PPI.         Advance Care Planning:   Code Status: Full Code    Consults: GI, general surgery.   Family Communication: none      Status is: Inpatient  Patient requires at least 2 midnights for further evaluation and treatment of present condition.      Objective: Vitals:   08/01/21 1218 08/01/21 1230 08/01/21 1239 08/01/21 1310  BP: 122/72 122/79 (!) 141/73 125/61  Pulse: (!) 58 (!) 50 (!) 50 (!) 45  Resp: 19 (!) 21 16 16   Temp: 97.8 F (36.6 C)  97.9 F (36.6 C) 98.9 F (37.2 C)  TempSrc:    Oral  SpO2: 97% 97% 94% 97%  Weight:      Height:        Intake/Output Summary (Last 24 hours) at 08/01/2021 1433 Last data filed at 08/01/2021 1426 Gross per 24 hour  Intake 1540 ml  Output 800 ml  Net 740 ml   Filed Weights   08/01/21 1042  Weight: 107 kg    Exam:  General: 41 y.o. year-old female well developed well nourished in no acute distress.  Alert and oriented x3. Cardiovascular: Regular rate and rhythm with no rubs or gallops.  No thyromegaly or JVD noted.   Respiratory: Clear to auscultation with no wheezes or rales. Good inspiratory effort. Abdomen: Soft right  upper quadrant tenderness with palpation.  Nondistended with bowel sounds. Musculoskeletal: No lower extremity edema. 2/4 pulses in all 4 extremities. Skin: No ulcerative lesions noted or rashes, Psychiatry: Mood is appropriate for condition and setting   Data Reviewed: CBC: Recent Labs  Lab 07/30/21 2108 07/31/21 0513 08/01/21 0340  WBC 16.2* 7.9 7.2  NEUTROABS  --   --  4.2  HGB 13.5 12.6 12.2  HCT 42.4  39.1 37.7  MCV 84.1 84.8 85.7  PLT 328 274 [id number]   Basic Metabolic Panel: Recent Labs  Lab 07/30/21 2108 07/31/21 0513 08/01/21 0340  NA 135 139 137  K 3.3* 3.6 3.7  CL 107 109 109  CO2 22 23 23   GLUCOSE 109* 96 105*  BUN 13 11 12   CREATININE 0.75 0.68 0.84  CALCIUM 9.2 8.4* 8.3*  MG  --  1.8 1.9  PHOS  --   --  3.5   GFR: Estimated Creatinine Clearance: 106.3 mL/min (by C-G formula based on SCr of 0.84 mg/dL). Liver Function Tests: Recent Labs  Lab 07/30/21 2108 07/31/21 0513 08/01/21 0340  AST 96* 361* 134*  ALT 62* 262* 221*  ALKPHOS 64 79 90  BILITOT 1.0 1.4* 0.6  PROT 7.6 6.7 6.5  ALBUMIN 4.2 3.6 3.4*   Recent Labs  Lab 07/30/21 2108  LIPASE 42   No results for input(s): AMMONIA in the last 168 hours. Coagulation Profile: No results for input(s): INR, PROTIME in the last 168 hours. Cardiac Enzymes: No results for input(s): CKTOTAL, CKMB, CKMBINDEX, TROPONINI in the last 168 hours. BNP (last 3 results) No results for input(s): PROBNP in the last 8760 hours. HbA1C: No results for input(s): HGBA1C in the last 72 hours. CBG: No results for input(s): GLUCAP in the last 168 hours. Lipid Profile: No results for input(s): CHOL, HDL, LDLCALC, TRIG, CHOLHDL, LDLDIRECT in the last 72 hours. Thyroid Function Tests: No results for input(s): TSH, T4TOTAL, FREET4, T3FREE, THYROIDAB in the last 72 hours. Anemia Panel: No results for input(s): VITAMINB12, FOLATE, FERRITIN, TIBC, IRON, RETICCTPCT in the last 72 hours. Urine analysis:    Component Value Date/Time   COLORURINE YELLOW (A) 07/30/2021 2324   APPEARANCEUR CLEAR (A) 07/30/2021 2324   LABSPEC 1.025 07/30/2021 2324   PHURINE 6.0 07/30/2021 2324   GLUCOSEU NEGATIVE 07/30/2021 2324   HGBUR NEGATIVE 07/30/2021 2324   BILIRUBINUR MODERATE (A) 07/30/2021 2324   BILIRUBINUR negative 07/10/2019 1533   KETONESUR 40 (A) 07/30/2021 2324   PROTEINUR 100 (A) 07/30/2021 2324   UROBILINOGEN 0.2 07/10/2019 1533    NITRITE NEGATIVE 07/30/2021 2324   LEUKOCYTESUR NEGATIVE 07/30/2021 2324   Sepsis Labs: @LABRCNTIP (procalcitonin:4,lacticidven:4)  ) Recent Results (from the past 240 hour(s))  Resp Panel by RT-PCR (Flu A&B, Covid) Nasopharyngeal Swab     Status: None   Collection Time: 07/31/21 12:19 AM   Specimen: Nasopharyngeal Swab; Nasopharyngeal(NP) swabs in vial transport medium  Result Value Ref Range Status   SARS Coronavirus 2 by RT PCR NEGATIVE NEGATIVE Final    Comment: (NOTE) SARS-CoV-2 target nucleic acids are NOT DETECTED.  The SARS-CoV-2 RNA is generally detectable in upper respiratory specimens during the acute phase of infection. The lowest concentration of SARS-CoV-2 viral copies this assay can detect is 138 copies/mL. A negative result does not preclude SARS-Cov-2 infection and should not be used as the sole basis for treatment or other patient management decisions. A negative result may occur with  improper specimen collection/handling, submission of specimen other than nasopharyngeal swab, presence of viral mutation(s)  within the areas targeted by this assay, and inadequate number of viral copies(<138 copies/mL). A negative result must be combined with clinical observations, patient history, and epidemiological information. The expected result is Negative.  Fact Sheet for Patients:  EntrepreneurPulse.com.au  Fact Sheet for Healthcare Providers:  IncredibleEmployment.be  This test is no t yet approved or cleared by the [place] FDA and  has been authorized for detection and/or diagnosis of SARS-CoV-2 by FDA under an Emergency Use Authorization (EUA). This EUA will remain  in effect (meaning this test can be used) for the duration of the COVID-19 declaration under Section 564(b)(1) of the Act, 21 U.S.C.section 360bbb-3(b)(1), unless the authorization is terminated  or revoked sooner.       Influenza A by PCR NEGATIVE NEGATIVE  Final   Influenza B by PCR NEGATIVE NEGATIVE Final    Comment: (NOTE) The Xpert Xpress SARS-CoV-2/FLU/RSV plus assay is intended as an aid in the diagnosis of influenza from Nasopharyngeal swab specimens and should not be used as a sole basis for treatment. Nasal washings and aspirates are unacceptable for Xpert Xpress SARS-CoV-2/FLU/RSV testing.  Fact Sheet for Patients: EntrepreneurPulse.com.au  Fact Sheet for Healthcare Providers: IncredibleEmployment.be  This test is not yet approved or cleared by the [place] FDA and has been authorized for detection and/or diagnosis of SARS-CoV-2 by FDA under an Emergency Use Authorization (EUA). This EUA will remain in effect (meaning this test can be used) for the duration of the COVID-19 declaration under Section 564(b)(1) of the Act, 21 U.S.C. section 360bbb-3(b)(1), unless the authorization is terminated or revoked.  Performed at [facility], [address], [place], [place] [address]   Surgical PCR screen     Status: Abnormal   Collection Time: 07/31/21  2:00 AM   Specimen: Nasal Mucosa; Nasal Swab  Result Value Ref Range Status   MRSA, PCR NEGATIVE NEGATIVE Final   Staphylococcus aureus POSITIVE (A) NEGATIVE Final    Comment: (NOTE) The Xpert SA Assay (FDA approved for NASAL specimens in patients 78 years of age and older), is one component of a comprehensive surveillance program. It is not intended to diagnose infection nor to guide or monitor treatment. Performed at [facility], [address] [place], [place] [address]   Urine Culture     Status: None   Collection Time: 07/31/21  1:40 PM   Specimen: Urine, Random  Result Value Ref Range Status   Specimen Description   Final    URINE, RANDOM Performed at [facility], [address] [place], [place] [address]    Special Requests   Final    NONE Performed at [facility], [address] [place], [place] [address]    Culture   Final    NO GROWTH Performed at [facility], [place] [address], [place], [place] [address]    Report Status 08/01/2021 FINAL  Final      Studies: DG C-Arm 1-60 Min-No Report  Result Date: 08/01/2021 Fluoroscopy was utilized by the requesting physician.  No radiographic interpretation.    Scheduled Meds:  indomethacin       lurasidone  60 mg Oral Daily   mupirocin ointment  1 application Nasal BID   pantoprazole  40 mg Oral Daily   traZODone  200 mg Oral QHS    Continuous Infusions:  sodium chloride 50 mL/hr at 08/01/21 0613   cefTRIAXone (ROCEPHIN)  IV 2 g (07/31/21 2117)     LOS: 1 day  Kayleen Memos, MD [facility] Pager [phone]  If 7PM-7AM, please contact night-coverage [email or web address] Password [facility] 08/01/2021, 2:33 PM

## 2021-08-01 NOTE — Anesthesia Preprocedure Evaluation (Signed)
Anesthesia Evaluation  Patient identified by MRN, date of birth, ID band Patient awake    Reviewed: Allergy & Precautions, NPO status , Patient's Chart, lab work & pertinent test results  History of Anesthesia Complications Negative for: history of anesthetic complications  Airway Mallampati: III   Neck ROM: Full    Dental  (+) Dental Advidsory Given, Teeth Intact   Pulmonary neg pulmonary ROS,           Cardiovascular Exercise Tolerance: Good hypertension, (-) angina(-) Past MI (-) dysrhythmias (-) Valvular Problems/Murmurs Rhythm:Regular Rate:Normal     Neuro/Psych Anxiety Depression Current headache negative neurological ROS     GI/Hepatic Neg liver ROS, GERD  ,  Endo/Other  neg diabetesMorbid obesity  Renal/GU negative Renal ROS  negative genitourinary   Musculoskeletal negative musculoskeletal ROS (+)   Abdominal   Peds  Hematology   Anesthesia Other Findings Past Medical History: No date: ADHD, adult residual type No date: Anxiety No date: Depression No date: GERD (gastroesophageal reflux disease) No date: Hypertension No date: Wears contact lenses   Reproductive/Obstetrics negative OB ROS IOL for chronic HTN                             Anesthesia Physical  Anesthesia Plan  ASA: 3  Anesthesia Plan: General   Post-op Pain Management:    Induction: Intravenous  PONV Risk Score and Plan: 3 and Ondansetron, Dexamethasone, Midazolam and Treatment may vary due to age or medical condition  Airway Management Planned: Oral ETT  Additional Equipment:   Intra-op Plan:   Post-operative Plan: Extubation in OR  Informed Consent: I have reviewed the patients History and Physical, chart, labs and discussed the procedure including the risks, benefits and alternatives for the proposed anesthesia with the patient or authorized representative who has indicated his/her  understanding and acceptance.       Plan Discussed with:   Anesthesia Plan Comments: (Patient requested a labor epidural. Patient seen and examined prior to placement of epidural (note entered afterwards).  Risks and benefits discussed, including bleeding, infection, nerve injury, reaction to medication, failure, and headache.  She understands and wishes to proceed.  )        Anesthesia Quick Evaluation

## 2021-08-01 NOTE — Transfer of Care (Signed)
Immediate Anesthesia Transfer of Care Note  Patient: Brenda [name]  Procedure(s) Performed: ENDOSCOPIC RETROGRADE CHOLANGIOPANCREATOGRAPHY (ERCP)  Patient Location: PACU  Anesthesia Type:General  Level of Consciousness: awake  Airway & Oxygen Therapy: Patient Spontanous Breathing  Post-op Assessment: Report given to RN and Post -op Vital signs reviewed and stable  Post vital signs: Reviewed  Last Vitals:  Vitals Value Taken Time  BP 122/72 08/01/21 1217  Temp    Pulse 59 08/01/21 1218  Resp 27 08/01/21 1218  SpO2 97 % 08/01/21 1218  Vitals shown include unvalidated device data.  Last Pain:  Vitals:   08/01/21 1042  TempSrc: Temporal  PainSc: 0-No pain         Complications: No notable events documented.

## 2021-08-01 NOTE — Op Note (Signed)
[facility] Gastroenterology Patient Name: Brenda [name] Procedure Date: 08/01/2021 11:00 AM MRN: [id number] Account #: [id number] Date of Birth: 1980/06/30 Admit Type: Inpatient Age: 41 Room: [facility] ENDO ROOM 4 Gender: Female Note Status: Finalized Instrument Name: [id number] Procedure:             ERCP Indications:           Common bile duct stone(s) Providers:             [name] Minium MD, MD Referring MD:          Duanne Limerick, MD (Referring MD) Medicines:             General Anesthesia Complications:         No immediate complications. Procedure:             Pre-Anesthesia Assessment:                        - Prior to the procedure, a History and Physical was                         performed, and patient medications and allergies were                         reviewed. The patient's tolerance of previous                         anesthesia was also reviewed. The risks and benefits                         of the procedure and the sedation options and risks                         were discussed with the patient. All questions were                         answered, and informed consent was obtained. Prior                         Anticoagulants: The patient has taken no previous                         anticoagulant or antiplatelet agents. ASA Grade                         Assessment: II - A patient with mild systemic disease.                         After reviewing the risks and benefits, the patient                         was deemed in satisfactory condition to undergo the                         procedure.                        After obtaining informed consent, the scope was passed  under direct vision. Throughout the procedure, the                         patient's blood pressure, pulse, and oxygen                         saturations were monitored continuously. The                         Duodenoscope was introduced  through the mouth, and                         used to inject contrast into and used to inject                         contrast into the bile duct. The ERCP was accomplished                         without difficulty. The patient tolerated the                         procedure well. Findings:      The scout film was normal. The esophagus was successfully intubated       under direct vision. The scope was advanced to a normal major papilla in       the descending duodenum without detailed examination of the pharynx,       larynx and associated structures, and upper GI tract. The upper GI tract       was grossly normal. The bile duct was deeply cannulated with the       short-nosed traction sphincterotome. Contrast was injected. I personally       interpreted the bile duct images. There was brisk flow of contrast       through the ducts. Image quality was excellent. Contrast extended to the       entire biliary tree. The lower third of the main bile duct contained       filling defect(s). A wire was passed into the biliary tree. A 7 mm       biliary sphincterotomy was made with a traction (standard)       sphincterotome using ERBE electrocautery. Moderate bleeding from the       sphincterotomy stopped within 5 minutes. The biliary tree was swept with       a 15 mm balloon starting at the bifurcation. Sludge was swept from the       duct. One 10 Fr by 7 cm plastic stent was placed into the common bile       duct. Bile flowed through the stent. The stent was in good position. One       10 Fr by 5 cm plastic stent was placed into the common bile duct. Bile       flowed through the stent. The stent was in good position. Impression:            - A filling defect was seen on the cholangiogram.                        - A biliary sphincterotomy was performed.                        -  The biliary tree was swept and sludge was found.                        - One plastic stent was placed into the  common bile                         duct.                        - One plastic stent was placed into the common bile                         duct. Recommendation:        - Return patient to hospital ward for ongoing care.                        - Clear liquid diet.                        - Watch for pancreatitis, bleeding, perforation, and                         cholangitis. Procedure Code(s):     --- Professional ---                        [id number], Endoscopic retrograde cholangiopancreatography                         (ERCP); with placement of endoscopic stent into                         biliary or pancreatic duct, including pre- and                         post-dilation and guide wire passage, when performed,                         including sphincterotomy, when performed, each stent                        43274, 59, Endoscopic retrograde                         cholangiopancreatography (ERCP); with placement of                         endoscopic stent into biliary or pancreatic duct,                         including pre- and post-dilation and guide wire                         passage, when performed, including sphincterotomy,                         when performed, each stent                        43264, Endoscopic retrograde cholangiopancreatography                         (  ERCP); with removal of calculi/debris from                         biliary/pancreatic duct(s)                        [phone], Endoscopic catheterization of the biliary                         ductal system, radiological supervision and                         interpretation Diagnosis Code(s):     --- Professional ---                        K80.50, Calculus of bile duct without cholangitis or                         cholecystitis without obstruction                        R93.2, Abnormal findings on diagnostic imaging of                         liver and biliary tract CPT copyright 2019 American Medical  Association. All rights reserved. The codes documented in this report are preliminary and upon coder review may  be revised to meet current compliance requirements. [name] Minium MD, MD 08/01/2021 12:08:33 PM This report has been signed electronically. Number of Addenda: 0 Note Initiated On: 08/01/2021 11:00 AM Estimated Blood Loss:  Estimated blood loss: none.      [facility]

## 2021-08-01 NOTE — H&P (View-Only) (Signed)
Patient ID: Brenda [name], female   DOB: 29-Aug-1980, 41 y.o.   MRN: [id number]     [place] Hospital Day(s): 1.   Interval History: Patient seen and examined, no acute events or new complaints overnight. Patient reports feeling well.  She denies abdominal pain after ERCP.  She had ERCP today.  I discussed ERCP with gastroenterologist.  Vital signs in last 24 hours: [min-max] current  Temp:  [97.8 F (36.6 C)-98.9 F (37.2 C)] 97.9 F (36.6 C) (02/06 1239) Pulse Rate:  [50-64] 50 (02/06 1239) Resp:  [16-21] 16 (02/06 1239) BP: (95-149)/(58-85) 141/73 (02/06 1239) SpO2:  [94 %-98 %] 94 % (02/06 1239) Weight:  [107 kg] 107 kg (02/06 1042)     Height: 5\' 4"  (162.6 cm) Weight: 107 kg BMI (Calculated): 40.47   Physical Exam:  Constitutional: alert, cooperative and no distress  Respiratory: breathing non-labored at rest  Cardiovascular: regular rate and sinus rhythm  Gastrointestinal: soft, non-tender, and non-distended  Labs:  CBC Latest Ref Rng & Units 08/01/2021 07/31/2021 07/30/2021  WBC 4.0 - 10.5 K/uL 7.2 7.9 16.2(H)  Hemoglobin 12.0 - 15.0 g/dL 12.2 12.6 13.5  Hematocrit 36.0 - 46.0 % 37.7 39.1 42.4  Platelets 150 - 400 K/uL 271 274 328   CMP Latest Ref Rng & Units 08/01/2021 07/31/2021 07/30/2021  Glucose 70 - 99 mg/dL 105(H) 96 109(H)  BUN 6 - 20 mg/dL 12 11 13   Creatinine 0.44 - 1.00 mg/dL 0.84 0.68 0.75  Sodium 135 - 145 mmol/L 137 139 135  Potassium 3.5 - 5.1 mmol/L 3.7 3.6 3.3(L)  Chloride 98 - 111 mmol/L 109 109 107  CO2 22 - 32 mmol/L 23 23 22   Calcium 8.9 - 10.3 mg/dL 8.3(L) 8.4(L) 9.2  Total Protein 6.5 - 8.1 g/dL 6.5 6.7 7.6  Total Bilirubin 0.3 - 1.2 mg/dL 0.6 1.4(H) 1.0  Alkaline Phos 38 - 126 U/L 90 79 64  AST 15 - 41 U/L 134(H) 361(H) 96(H)  ALT 0 - 44 U/L 221(H) 262(H) 62(H)    Imaging studies: I personally evaluated the images of the ERCP   Assessment/Plan:  41 y.o. female with cholelithiasis Day of Surgery s/p ERCP, complicated by pertinent  comorbidities including obesity.  Choledocholithiasis -ERCP done today -I discussed again with patient recommendation of cholecystectomy before discharge.  I discussed with patient the surgical procedure, the benefit and the risk.  I discussed with patient the risks of bleeding, infection, injury to adjacent organs, injury to common bile duct, bile leak, among others. -The patient and the history understood and agreed to proceed with cholecystectomy tomorrow if no complication from ERCP.  Arnold Long, MD

## 2021-08-01 NOTE — Progress Notes (Signed)
Patient ID: Brenda [name], female   DOB: January 09, 1981, 41 y.o.   MRN: [id number]     [place] Hospital Day(s): 1.   Interval History: Patient seen and examined, no acute events or new complaints overnight. Patient reports feeling well.  She denies abdominal pain after ERCP.  She had ERCP today.  I discussed ERCP with gastroenterologist.  Vital signs in last 24 hours: [min-max] current  Temp:  [97.8 F (36.6 C)-98.9 F (37.2 C)] 97.9 F (36.6 C) (02/06 1239) Pulse Rate:  [50-64] 50 (02/06 1239) Resp:  [16-21] 16 (02/06 1239) BP: (95-149)/(58-85) 141/73 (02/06 1239) SpO2:  [94 %-98 %] 94 % (02/06 1239) Weight:  [107 kg] 107 kg (02/06 1042)     Height: 5\' 4"  (162.6 cm) Weight: 107 kg BMI (Calculated): 40.47   Physical Exam:  Constitutional: alert, cooperative and no distress  Respiratory: breathing non-labored at rest  Cardiovascular: regular rate and sinus rhythm  Gastrointestinal: soft, non-tender, and non-distended  Labs:  CBC Latest Ref Rng & Units 08/01/2021 07/31/2021 07/30/2021  WBC 4.0 - 10.5 K/uL 7.2 7.9 16.2(H)  Hemoglobin 12.0 - 15.0 g/dL 12.2 12.6 13.5  Hematocrit 36.0 - 46.0 % 37.7 39.1 42.4  Platelets 150 - 400 K/uL 271 274 328   CMP Latest Ref Rng & Units 08/01/2021 07/31/2021 07/30/2021  Glucose 70 - 99 mg/dL 105(H) 96 109(H)  BUN 6 - 20 mg/dL 12 11 13   Creatinine 0.44 - 1.00 mg/dL 0.84 0.68 0.75  Sodium 135 - 145 mmol/L 137 139 135  Potassium 3.5 - 5.1 mmol/L 3.7 3.6 3.3(L)  Chloride 98 - 111 mmol/L 109 109 107  CO2 22 - 32 mmol/L 23 23 22   Calcium 8.9 - 10.3 mg/dL 8.3(L) 8.4(L) 9.2  Total Protein 6.5 - 8.1 g/dL 6.5 6.7 7.6  Total Bilirubin 0.3 - 1.2 mg/dL 0.6 1.4(H) 1.0  Alkaline Phos 38 - 126 U/L 90 79 64  AST 15 - 41 U/L 134(H) 361(H) 96(H)  ALT 0 - 44 U/L 221(H) 262(H) 62(H)    Imaging studies: I personally evaluated the images of the ERCP   Assessment/Plan:  41 y.o. female with cholelithiasis Day of Surgery s/p ERCP, complicated by pertinent  comorbidities including obesity.  Choledocholithiasis -ERCP done today -I discussed again with patient recommendation of cholecystectomy before discharge.  I discussed with patient the surgical procedure, the benefit and the risk.  I discussed with patient the risks of bleeding, infection, injury to adjacent organs, injury to common bile duct, bile leak, among others. -The patient and the history understood and agreed to proceed with cholecystectomy tomorrow if no complication from ERCP.  Arnold Long, MD

## 2021-08-01 NOTE — Plan of Care (Signed)
  Problem: Clinical Measurements: Goal: Ability to maintain clinical measurements within normal limits will improve Outcome: Progressing   

## 2021-08-01 NOTE — TOC Initial Note (Signed)
Transition of Care [facility]) - Initial/Assessment Note    Patient Details  Name: Brenda [name] MRN: [id number] Date of Birth: Nov 01, 1980  Transition of Care [facility]) CM/SW Contact:    Chapman Fitch, RN Phone Number: 08/01/2021, 10:06 AM  Clinical Narrative:                   Transition of Care [facility]) Screening Note   Patient Details  Name: Brenda [name] Date of Birth: 11/18/1980   Transition of Care (TOC) CM/SW Contact:    Chapman Fitch, RN Phone Number: 08/01/2021, 10:07 AM    Transition of Care Department (TOC) has reviewed patient and no TOC needs have been identified at this time. We will continue to monitor patient advancement through interdisciplinary progression rounds. If new patient transition needs arise, please place a TOC consult.         Patient Goals and CMS Choice        Expected Discharge Plan and Services                                                Prior Living Arrangements/Services                       Activities of Daily Living Home Assistive Devices/Equipment: None ADL Screening (condition at time of admission) Patient's cognitive ability adequate to safely complete daily activities?: Yes Is the patient deaf or have difficulty hearing?: No Does the patient have difficulty seeing, even when wearing glasses/contacts?: No Does the patient have difficulty concentrating, remembering, or making decisions?: No Patient able to express need for assistance with ADLs?: No Does the patient have difficulty dressing or bathing?: No Independently performs ADLs?: No Does the patient have difficulty walking or climbing stairs?: No Weakness of Legs: None Weakness of Arms/Hands: None  Permission Sought/Granted                  Emotional Assessment              Admission diagnosis:  Choledocholithiasis [K80.50] RUQ abdominal pain [R10.11] Calculus of gallbladder without cholecystitis without obstruction  [K80.20] Patient Active Problem List   Diagnosis Date Noted   Anxiety 07/31/2021   Hypokalemia 07/31/2021   Choledocholithiasis with obstruction 07/31/2021   Bipolar disorder in full remission (HCC) 07/18/2021   Panic attacks 07/18/2021   History of ADHD 07/18/2021   Significant use of alcohol 07/18/2021   Postpartum care following vaginal delivery 03/17/2021   Chronic benign essential hypertension, antepartum 03/15/2021   Pre-existing essential hypertension during pregnancy, antepartum 08/05/2020   Gastroesophageal reflux disease 07/10/2019   Essential hypertension 07/10/2019   Performance anxiety 07/10/2019   Epigastric abdominal pain 03/10/2018   Rectal polyp    Heartburn    Nausea    Adult ADHD 12/07/2014   PCP:  Duanne Limerick, MD Pharmacy:   [facility] [address] [place] - [address] [place] [address] Phone: [phone] Fax: [phone]  [id number], [place] - [address] AT [facility] [address] [place] [address] Phone: [phone] Fax: [phone]     Social Determinants of Health (SDOH) Interventions    Readmission Risk Interventions No flowsheet data found.

## 2021-08-01 NOTE — Anesthesia Procedure Notes (Signed)
Procedure Name: Intubation Date/Time: 08/01/2021 11:25 AM Performed by: Mathews Argyle, CRNA Pre-anesthesia Checklist: Patient identified, Patient being monitored, Timeout performed, Emergency Drugs available and Suction available Patient Re-evaluated:Patient Re-evaluated prior to induction Oxygen Delivery Method: Circle system utilized Preoxygenation: Pre-oxygenation with 100% oxygen Induction Type: IV induction Ventilation: Mask ventilation without difficulty Laryngoscope Size: 3 and McGraph Grade View: Grade I Tube type: Oral Tube size: 7.0 mm Number of attempts: 1 Airway Equipment and Method: Stylet and Video-laryngoscopy Placement Confirmation: ETT inserted through vocal cords under direct vision, positive ETCO2 and breath sounds checked- equal and bilateral Secured at: 20 cm Tube secured with: Tape Dental Injury: Teeth and Oropharynx as per pre-operative assessment

## 2021-08-02 ENCOUNTER — Inpatient Hospital Stay: Payer: BC Managed Care – PPO | Admitting: Anesthesiology

## 2021-08-02 ENCOUNTER — Encounter: Payer: Self-pay | Admitting: Internal Medicine

## 2021-08-02 ENCOUNTER — Other Ambulatory Visit: Payer: Self-pay

## 2021-08-02 ENCOUNTER — Encounter
Admission: EM | Disposition: A | Discharge status: Home / Self Care | Payer: Self-pay | Source: Home / Self Care | Attending: Internal Medicine

## 2021-08-02 ENCOUNTER — Other Ambulatory Visit: Payer: Self-pay | Admitting: Obstetrics

## 2021-08-02 ENCOUNTER — Telehealth: Payer: Self-pay

## 2021-08-02 DIAGNOSIS — K219 Gastro-esophageal reflux disease without esophagitis: Secondary | ICD-10-CM

## 2021-08-02 LAB — CBC WITH DIFFERENTIAL/PLATELET
Abs Immature Granulocytes: 0.03 10*3/uL (ref 0.00–0.07)
Basophils Absolute: 0.1 10*3/uL (ref 0.0–0.1)
Basophils Relative: 1 %
Eosinophils Absolute: 0.4 10*3/uL (ref 0.0–0.5)
Eosinophils Relative: 5 %
HCT: 39 % (ref 36.0–46.0)
Hemoglobin: 12.8 g/dL (ref 12.0–15.0)
Immature Granulocytes: 0 %
Lymphocytes Relative: 27 %
Lymphs Abs: 2.1 10*3/uL (ref 0.7–4.0)
MCH: 27.8 pg (ref 26.0–34.0)
MCHC: 32.8 g/dL (ref 30.0–36.0)
MCV: 84.8 fL (ref 80.0–100.0)
Monocytes Absolute: 0.6 10*3/uL (ref 0.1–1.0)
Monocytes Relative: 8 %
Neutro Abs: 4.7 10*3/uL (ref 1.7–7.7)
Neutrophils Relative %: 59 %
Platelets: 277 10*3/uL (ref 150–400)
RBC: 4.6 MIL/uL (ref 3.87–5.11)
RDW: 14.3 % (ref 11.5–15.5)
WBC: 7.9 10*3/uL (ref 4.0–10.5)
nRBC: 0 % (ref 0.0–0.2)

## 2021-08-02 LAB — COMPREHENSIVE METABOLIC PANEL
ALT: 146 U/L — ABNORMAL HIGH (ref 0–44)
AST: 52 U/L — ABNORMAL HIGH (ref 15–41)
Albumin: 3.2 g/dL — ABNORMAL LOW (ref 3.5–5.0)
Alkaline Phosphatase: 86 U/L (ref 38–126)
Anion gap: 4 — ABNORMAL LOW (ref 5–15)
BUN: 5 mg/dL — ABNORMAL LOW (ref 6–20)
CO2: 26 mmol/L (ref 22–32)
Calcium: 8.5 mg/dL — ABNORMAL LOW (ref 8.9–10.3)
Chloride: 107 mmol/L (ref 98–111)
Creatinine, Ser: 0.66 mg/dL (ref 0.44–1.00)
GFR, Estimated: >60 mL/min (ref >60)
Glucose, Bld: 89 mg/dL (ref 70–99)
Potassium: 3.8 mmol/L (ref 3.5–5.1)
Sodium: 137 mmol/L (ref 135–145)
Total Bilirubin: 0.5 mg/dL (ref 0.3–1.2)
Total Protein: 6.4 g/dL — ABNORMAL LOW (ref 6.5–8.1)

## 2021-08-02 LAB — MAGNESIUM: Magnesium: 1.9 mg/dL (ref 1.7–2.4)

## 2021-08-02 LAB — PHOSPHORUS: Phosphorus: 3.1 mg/dL (ref 2.5–4.6)

## 2021-08-02 LAB — LIPASE, BLOOD: Lipase: 31 U/L (ref 11–51)

## 2021-08-02 SURGERY — CHOLECYSTECTOMY, ROBOT-ASSISTED, LAPAROSCOPIC
Anesthesia: General

## 2021-08-02 MED ORDER — EPHEDRINE SULFATE (PRESSORS) 50 MG/ML IJ SOLN
INTRAMUSCULAR | Status: DC | PRN
  Administered 2021-08-02: 10 mg via INTRAVENOUS

## 2021-08-02 MED ORDER — POLYETHYLENE GLYCOL 3350 17 G PO PACK: 17.0000 g | PACK | Freq: Every day | ORAL | Status: DC | PRN

## 2021-08-02 MED ORDER — ONDANSETRON HCL 4 MG/2ML IJ SOLN
INTRAMUSCULAR | Status: DC | PRN
  Administered 2021-08-02 (×2): 4 mg via INTRAVENOUS

## 2021-08-02 MED ORDER — INDOCYANINE GREEN 25 MG IV SOLR
1.2500 mg | Freq: Once | INTRAVENOUS | Status: AC
Start: 2021-08-02 — End: 2021-08-02
  Administered 2021-08-02: 1.25 mg via INTRAVENOUS
  Filled 2021-08-02: qty 0.5

## 2021-08-02 MED ORDER — FENTANYL CITRATE (PF) 100 MCG/2ML IJ SOLN
INTRAMUSCULAR | Status: AC
  Filled 2021-08-02: qty 2

## 2021-08-02 MED ORDER — CEFAZOLIN SODIUM-DEXTROSE 2-3 GM-%(50ML) IV SOLR
INTRAVENOUS | Status: DC | PRN
  Administered 2021-08-02: 2 g via INTRAVENOUS

## 2021-08-02 MED ORDER — PHENYLEPHRINE HCL (PRESSORS) 10 MG/ML IV SOLN
INTRAVENOUS | Status: AC
  Filled 2021-08-02: qty 1

## 2021-08-02 MED ORDER — MIDAZOLAM HCL 2 MG/2ML IJ SOLN
INTRAMUSCULAR | Status: AC
  Filled 2021-08-02: qty 2

## 2021-08-02 MED ORDER — DEXMEDETOMIDINE HCL IN NACL 200 MCG/50ML IV SOLN
INTRAVENOUS | Status: DC | PRN
  Administered 2021-08-02: 8 ug via INTRAVENOUS

## 2021-08-02 MED ORDER — ACETAMINOPHEN 10 MG/ML IV SOLN
INTRAVENOUS | Status: AC
  Filled 2021-08-02: qty 100

## 2021-08-02 MED ORDER — EPHEDRINE 5 MG/ML INJ
INTRAVENOUS | Status: AC
  Filled 2021-08-02: qty 5

## 2021-08-02 MED ORDER — FENTANYL CITRATE (PF) 100 MCG/2ML IJ SOLN
INTRAMUSCULAR | Status: DC | PRN
  Administered 2021-08-02 (×2): 50 ug via INTRAVENOUS
  Administered 2021-08-02: 100 ug via INTRAVENOUS

## 2021-08-02 MED ORDER — HYDROMORPHONE HCL 1 MG/ML IJ SOLN
0.2500 mg | INTRAMUSCULAR | Status: DC | PRN
  Administered 2021-08-02: 0.5 mg via INTRAVENOUS

## 2021-08-02 MED ORDER — PROMETHAZINE HCL 25 MG/ML IJ SOLN: 6.2500 mg | INTRAMUSCULAR | Status: DC | PRN

## 2021-08-02 MED ORDER — GLYCOPYRROLATE 0.2 MG/ML IJ SOLN
INTRAMUSCULAR | Status: DC | PRN
  Administered 2021-08-02: .2 mg via INTRAVENOUS

## 2021-08-02 MED ORDER — CEFAZOLIN SODIUM 1 G IJ SOLR
INTRAMUSCULAR | Status: AC
  Filled 2021-08-02: qty 20

## 2021-08-02 MED ORDER — 0.9 % SODIUM CHLORIDE (POUR BTL) OPTIME
TOPICAL | Status: DC | PRN
  Administered 2021-08-02: 100 mL

## 2021-08-02 MED ORDER — LIDOCAINE HCL (CARDIAC) PF 100 MG/5ML IV SOSY
PREFILLED_SYRINGE | INTRAVENOUS | Status: DC | PRN
  Administered 2021-08-02: 60 mg via INTRAVENOUS

## 2021-08-02 MED ORDER — BUPIVACAINE-EPINEPHRINE 0.25% -1:200000 IJ SOLN
INTRAMUSCULAR | Status: DC | PRN
  Administered 2021-08-02: 30 mL

## 2021-08-02 MED ORDER — ACETAMINOPHEN 10 MG/ML IV SOLN
INTRAVENOUS | Status: DC | PRN
Start: 2021-08-02 — End: 2021-08-02
  Administered 2021-08-02: 1000 mg via INTRAVENOUS

## 2021-08-02 MED ORDER — ACETAMINOPHEN 10 MG/ML IV SOLN: 1000.0000 mg | Freq: Once | INTRAVENOUS | Status: DC | PRN

## 2021-08-02 MED ORDER — SODIUM CHLORIDE (PF) 0.9 % IJ SOLN
INTRAMUSCULAR | Status: AC
  Filled 2021-08-02: qty 10

## 2021-08-02 MED ORDER — DEXAMETHASONE SODIUM PHOSPHATE 10 MG/ML IJ SOLN
INTRAMUSCULAR | Status: DC | PRN
Start: 2021-08-02 — End: 2021-08-02
  Administered 2021-08-02: 10 mg via INTRAVENOUS

## 2021-08-02 MED ORDER — KETOROLAC TROMETHAMINE 30 MG/ML IJ SOLN
INTRAMUSCULAR | Status: DC | PRN
  Administered 2021-08-02: 30 mg via INTRAVENOUS

## 2021-08-02 MED ORDER — ROCURONIUM BROMIDE 100 MG/10ML IV SOLN
INTRAVENOUS | Status: DC | PRN
Start: 2021-08-02 — End: 2021-08-02
  Administered 2021-08-02: 50 mg via INTRAVENOUS
  Administered 2021-08-02 (×2): 10 mg via INTRAVENOUS

## 2021-08-02 MED ORDER — MIDAZOLAM HCL 2 MG/2ML IJ SOLN
INTRAMUSCULAR | Status: DC | PRN
  Administered 2021-08-02: 2 mg via INTRAVENOUS

## 2021-08-02 MED ORDER — PROPOFOL 10 MG/ML IV BOLUS
INTRAVENOUS | Status: DC | PRN
  Administered 2021-08-02: 180 mg via INTRAVENOUS

## 2021-08-02 MED ORDER — BUPIVACAINE-EPINEPHRINE (PF) 0.25% -1:200000 IJ SOLN
INTRAMUSCULAR | Status: AC
  Filled 2021-08-02: qty 30

## 2021-08-02 MED ORDER — HYDROMORPHONE HCL 1 MG/ML IJ SOLN
INTRAMUSCULAR | Status: AC
  Filled 2021-08-02: qty 1

## 2021-08-02 MED ORDER — SODIUM CHLORIDE 0.9 % IR SOLN
Status: DC | PRN
  Administered 2021-08-02: 100 mL

## 2021-08-02 MED ORDER — DEXMEDETOMIDINE (PRECEDEX) IN NS 20 MCG/5ML (4 MCG/ML) IV SYRINGE
PREFILLED_SYRINGE | INTRAVENOUS | Status: AC
  Filled 2021-08-02: qty 5

## 2021-08-02 MED ORDER — SUGAMMADEX SODIUM 200 MG/2ML IV SOLN
INTRAVENOUS | Status: DC | PRN
  Administered 2021-08-02: 300 mg via INTRAVENOUS

## 2021-08-02 MED ORDER — PROPOFOL 10 MG/ML IV BOLUS
INTRAVENOUS | Status: AC
  Filled 2021-08-02: qty 20

## 2021-08-02 SURGICAL SUPPLY — 45 items
BAG INFUSER PRESSURE 100CC (MISCELLANEOUS) ×1 IMPLANT
BLADE SURG SZ11 CARB STEEL (BLADE) ×2 IMPLANT
CANNULA REDUC XI 12-8 STAPL (CANNULA) ×1
CANNULA REDUCER 12-8 DVNC XI (CANNULA) ×1 IMPLANT
CLIP LIGATING HEM O LOK PURPLE (MISCELLANEOUS) ×1 IMPLANT
CLIP LIGATING HEMO O LOK GREEN (MISCELLANEOUS) ×2 IMPLANT
DERMABOND ADVANCED (GAUZE/BANDAGES/DRESSINGS) ×1
DERMABOND ADVANCED .7 DNX12 (GAUZE/BANDAGES/DRESSINGS) ×1 IMPLANT
DRAPE ARM DVNC X/XI (DISPOSABLE) ×4 IMPLANT
DRAPE COLUMN DVNC XI (DISPOSABLE) ×1 IMPLANT
DRAPE DA VINCI XI ARM (DISPOSABLE) ×4
DRAPE DA VINCI XI COLUMN (DISPOSABLE) ×1
ELECT REM PT RETURN 9FT ADLT (ELECTROSURGICAL) ×2
ELECTRODE REM PT RTRN 9FT ADLT (ELECTROSURGICAL) ×1 IMPLANT
GLOVE SURG ENC MOIS LTX SZ6.5 (GLOVE) ×5 IMPLANT
GLOVE SURG UNDER POLY LF SZ6.5 (GLOVE) ×5 IMPLANT
GOWN STRL REUS W/ TWL LRG LVL3 (GOWN DISPOSABLE) ×3 IMPLANT
GOWN STRL REUS W/TWL LRG LVL3 (GOWN DISPOSABLE) ×4
GRASPER SUT TROCAR 14GX15 (MISCELLANEOUS) ×2 IMPLANT
IRRIGATOR SUCT 8 DISP DVNC XI (IRRIGATION / IRRIGATOR) IMPLANT
IRRIGATOR SUCTION 8MM XI DISP (IRRIGATION / IRRIGATOR) ×1
KIT PINK PAD W/HEAD ARE REST (MISCELLANEOUS) ×2
KIT PINK PAD W/HEAD ARM REST (MISCELLANEOUS) ×1 IMPLANT
LABEL OR SOLS (LABEL) ×2 IMPLANT
MANIFOLD NEPTUNE II (INSTRUMENTS) ×2 IMPLANT
NDL INSUFFLATION 14GA 120MM (NEEDLE) ×1 IMPLANT
NEEDLE HYPO 22GX1.5 SAFETY (NEEDLE) ×2 IMPLANT
NEEDLE INSUFFLATION 14GA 120MM (NEEDLE) ×2 IMPLANT
NS IRRIG 500ML POUR BTL (IV SOLUTION) ×2 IMPLANT
OBTURATOR OPTICAL STANDARD 8MM (TROCAR) ×1
OBTURATOR OPTICAL STND 8 DVNC (TROCAR) ×1
OBTURATOR OPTICALSTD 8 DVNC (TROCAR) ×1 IMPLANT
PACK LAP CHOLECYSTECTOMY (MISCELLANEOUS) ×2 IMPLANT
POUCH SPECIMEN RETRIEVAL 10MM (ENDOMECHANICALS) ×2 IMPLANT
SEAL CANN UNIV 5-8 DVNC XI (MISCELLANEOUS) ×3 IMPLANT
SEAL XI 5MM-8MM UNIVERSAL (MISCELLANEOUS) ×3
SET TUBE SMOKE EVAC HIGH FLOW (TUBING) ×2 IMPLANT
SOLUTION ELECTROLUBE (MISCELLANEOUS) ×2 IMPLANT
STAPLER CANNULA SEAL DVNC XI (STAPLE) ×1 IMPLANT
STAPLER CANNULA SEAL XI (STAPLE) ×1
SUT MNCRL 4-0 (SUTURE) ×2
SUT MNCRL 4-0 27XMFL (SUTURE) ×2
SUT VICRYL 0 AB UR-6 (SUTURE) ×2 IMPLANT
SUTURE MNCRL 4-0 27XMF (SUTURE) ×1 IMPLANT
WATER STERILE IRR 500ML POUR (IV SOLUTION) ×2 IMPLANT

## 2021-08-02 NOTE — Telephone Encounter (Signed)
Patient had a stent placed yesterday at an ercp and needs a repeat ercp in 1 month for removal of stents. Thanks.

## 2021-08-02 NOTE — Op Note (Signed)
Preoperative diagnosis: Choledocholithiaisis  Postoperative diagnosis: Acute cholecystitis  Procedure: Robotic Assisted Laparoscopic Cholecystectomy.   Anesthesia: GETA   Surgeon: Dr. Windell Moment  Wound Classification: Clean Contaminated  Indications: Patient is a 41 y.o. female developed right upper quadrant pain and on workup was found to have cholelithiasis with a dilated common duct and choledocholithiasis. ERCP done with clearance of the CBD. Robotic Assisted Laparoscopic cholecystectomy was elected.  Findings:  Critical view of safety achieved Cystic duct and artery identified, ligated and divided Adequate hemostasis       Description of procedure: The patient was placed on the operating table in the supine position. General anesthesia was induced. A time-out was completed verifying correct patient, procedure, site, positioning, and implant(s) and/or special equipment prior to beginning this procedure. An orogastric tube was placed. The abdomen was prepped and draped in the usual sterile fashion.  An incision was made in a natural skin line below the umbilicus.  The fascia was elevated and the Veress needle inserted. Proper position was confirmed by aspiration and saline meniscus test.  The abdomen was insufflated with carbon dioxide to a pressure of 15 mmHg. The patient tolerated insufflation well. A 8-mm trocar was then inserted in optiview fashion.  The laparoscope was inserted and the abdomen inspected. No injuries from initial trocar placement were noted. Additional trocars were then inserted in the following locations: an 8-mm trocar in the left lateral abdomen, and another two 8-mm trocars to the right side of the abdomen 5 cm appart. The umbilical trocar was changed to a 12 mm trocar all under direct visualization. The abdomen was inspected and no abnormalities were found. The table was placed in the reverse Trendelenburg position with the right side up. The robotic arms  were docked and target anatomy identified. Instrument inserted under direct visualization.  Filmy adhesions between the gallbladder and omentum, duodenum and transverse colon were lysed with electrocautery. The dome of the gallbladder was grasped with a prograsp and retracted over the dome of the liver. The infundibulum was also grasped with an atraumatic grasper and retracted toward the right lower quadrant. This maneuver exposed Calots triangle. The peritoneum overlying the gallbladder infundibulum was then incised and the cystic duct and cystic artery identified and circumferentially dissected. Critical view of safety reviewed before ligating any structure. Firefly images taken to visualize biliary ducts. The cystic duct and cystic artery were then doubly clipped and divided close to the gallbladder.  The gallbladder was then dissected from its peritoneal attachments by electrocautery. Hemostasis was checked and the gallbladder and contained stones were removed using an endoscopic retrieval bag. The gallbladder was passed off the table as a specimen. The gallbladder fossa was copiously irrigated with saline and hemostasis was obtained. There was no evidence of bleeding from the gallbladder fossa or cystic artery or leakage of the bile from the cystic duct stump. Secondary trocars were removed under direct vision. No bleeding was noted. The robotic arms were undoked. The scope was withdrawn and the umbilical trocar removed. The abdomen was allowed to collapse. The fascia of the 49mm trocar sites was closed with figure-of-eight 0 vicryl sutures. The skin was closed with subcuticular sutures of 4-0 monocryl and topical skin adhesive. The orogastric tube was removed.  The patient tolerated the procedure well and was taken to the postanesthesia care unit in stable condition.   Specimen: Gallbladder  Complications: None  EBL: 5 mL

## 2021-08-02 NOTE — Anesthesia Preprocedure Evaluation (Addendum)
Anesthesia Evaluation  Patient identified by MRN, date of birth, ID band Patient awake    Reviewed: Allergy & Precautions, NPO status , Patient's Chart, lab work & pertinent test results  History of Anesthesia Complications Negative for: history of anesthetic complications  Airway Mallampati: III   Neck ROM: Full    Dental  (+) Dental Advidsory Given, Teeth Intact   Pulmonary neg pulmonary ROS,           Cardiovascular Exercise Tolerance: Good hypertension (held while inpatient), Pt. on medications (-) angina(-) Past MI (-) dysrhythmias (-) Valvular Problems/Murmurs Rhythm:Regular Rate:Normal     Neuro/Psych PSYCHIATRIC DISORDERS Anxiety Depression Current headache negative neurological ROS     GI/Hepatic GERD  Controlled and Medicated,(+)     substance abuse  alcohol use, cholecystitis   Endo/Other  neg diabetesMorbid obesity  Renal/GU negative Renal ROS  negative genitourinary   Musculoskeletal negative musculoskeletal ROS (+)   Abdominal   Peds  Hematology   Anesthesia Other Findings Past Medical History: No date: ADHD, adult residual type No date: Anxiety No date: Depression No date: GERD (gastroesophageal reflux disease) No date: Hypertension No date: Wears contact lenses   Reproductive/Obstetrics negative OB ROS IOL for chronic HTN                            Anesthesia Physical  Anesthesia Plan  ASA: 3  Anesthesia Plan: General   Post-op Pain Management: Tylenol PO (pre-op), Gabapentin PO (pre-op), Toradol IV (intra-op) and Regional block   Induction: Intravenous  PONV Risk Score and Plan: 3 and Ondansetron, Dexamethasone, Midazolam, Treatment may vary due to age or medical condition and Propofol infusion  Airway Management Planned: Oral ETT  Additional Equipment:   Intra-op Plan:   Post-operative Plan: Extubation in OR  Informed Consent: I have reviewed  the patients History and Physical, chart, labs and discussed the procedure including the risks, benefits and alternatives for the proposed anesthesia with the patient or authorized representative who has indicated his/her understanding and acceptance.     Dental advisory given  Plan Discussed with: CRNA and Anesthesiologist  Anesthesia Plan Comments: (  )       Anesthesia Quick Evaluation

## 2021-08-02 NOTE — Interval H&P Note (Signed)
History and Physical Interval Note:  08/02/2021 11:09 AM  Brenda [name]  has presented today for surgery, with the diagnosis of choledocholithiasis.  The various methods of treatment have been discussed with the patient and family. After consideration of risks, benefits and other options for treatment, the patient has consented to  Procedure(s): XI ROBOTIC ASSISTED LAPAROSCOPIC CHOLECYSTECTOMY (N/A) INDOCYANINE GREEN FLUORESCENCE IMAGING (ICG) (N/A) as a surgical intervention.  The patient's history has been reviewed, patient examined, no change in status, stable for surgery.  I have reviewed the patient's chart and labs.  Questions were answered to the patient's satisfaction.     Carolan Shiver

## 2021-08-02 NOTE — Transfer of Care (Addendum)
Immediate Anesthesia Transfer of Care Note  Patient: Brenda [name]  Procedure(s) Performed: XI ROBOTIC ASSISTED LAPAROSCOPIC CHOLECYSTECTOMY INDOCYANINE GREEN FLUORESCENCE IMAGING (ICG)  Patient Location: PACU  Anesthesia Type:General  Level of Consciousness: awake, drowsy and patient cooperative  Airway & Oxygen Therapy: Patient Spontanous Breathing and Patient connected to face mask oxygen  Post-op Assessment: Report given to RN and Post -op Vital signs reviewed and stable  Post vital signs: Reviewed and stable  Last Vitals:  Vitals Value Taken Time  BP 143/69 08/02/21 1352  Temp    Pulse 75 08/02/21 1356  Resp    SpO2 100 % 08/02/21 1356  Vitals shown include unvalidated device data.  Last Pain:  Vitals:   08/02/21 1032  TempSrc:   PainSc: 1          Complications: No notable events documented.

## 2021-08-02 NOTE — Anesthesia Procedure Notes (Signed)
Procedure Name: Intubation Date/Time: 08/02/2021 11:40 AM Performed by: Jonna Clark, CRNA Pre-anesthesia Checklist: Patient identified, Patient being monitored, Timeout performed, Emergency Drugs available and Suction available Patient Re-evaluated:Patient Re-evaluated prior to induction Oxygen Delivery Method: Circle system utilized Preoxygenation: Pre-oxygenation with 100% oxygen Induction Type: IV induction Ventilation: Mask ventilation without difficulty Laryngoscope Size: 3 and McGraph Grade View: Grade I Tube type: Oral Tube size: 7.0 mm Number of attempts: 1 Airway Equipment and Method: Stylet Placement Confirmation: ETT inserted through vocal cords under direct vision, positive ETCO2 and breath sounds checked- equal and bilateral Secured at: 21 cm Tube secured with: Tape Dental Injury: Teeth and Oropharynx as per pre-operative assessment

## 2021-08-02 NOTE — Progress Notes (Signed)
PROGRESS NOTE  Brenda [name] [id number] DOB: 19-Jan-1981 DOA: 07/30/2021 PCP: Juline Patch, MD  HPI/Recap of past 24 hours: Brenda [name] is a 41 y.o. female with medical history significant of HTN, chronic anxiety/depression, ADHD, who presents to [facility] ED with sudden onset right upper quadrant abdominal pain that started around 3 PM on the day of presentation to the ED.  It was associated with a single episode of nonbloody nonbilious emesis.  Work-up revealed cholelithiasis without evidence of acute cholecystitis and common bile duct diameter of 7 mm.  Due to concern for choledocholithiasis MRCP was obtained which revealed possible distal stone.  GI was consulted and plan is to perform ERCP on 08/01/2021.     General surgery was also consulted to consider elective cholecystectomy while inpatient to avoid recurrent symptomatic cholelithiasis/choledocholithiasis.  No evidence of acute cholangitis.  However patient reports that she is symptomatic from pyuria.  States she has a history of urinary tract infection and her symptoms are lower back pain.  Endorses lower back pain similar to previous UTI symptoms.  Urine culture obtained, started on Rocephin 2 g daily x3 days for presumptive UTI.  Urine culture returned negative no growth.    Post ERCP on 08/01/2021.  No complications, no evidence of pancreatitis.  LFTs are downtrending.  Plan lap chole on 08/02/2021.  08/02/2021: Seen and examined at bedside.  No significant abdominal pain no nausea.  Plan for lap chole today.  Assessment/Plan: Principal Problem:   Choledocholithiasis with obstruction Active Problems:   Essential hypertension   Anxiety   Hypokalemia  Resolved symptomatic choledocholithiasis with obstruction post ERCP on 08/01/2021 Presented with right upper quadrant abdominal pain, nausea and vomiting. Work-up revealed choledocholithiasis with transaminitis. ERCP on 08/01/2021.  No complications, no evidence of pancreatitis.   Lipase negative.  No epigastric pain.  No significant abdominal pain or nausea post ERCP. Plan lap chole on 08/02/2021. Continue gentle IV fluid hydration Pain management and bowel regimen as needed  Essential hypertension BP stable. BP is at goal without oral antihypertensives Continue to monitor   Resolved post repletion: Hypokalemia Electrolytes are stable. Replete electrolytes as indicated.   Chronic anxiety  Continue home regimen  ADHD Continue to hold off home Adderall   Morbid obesity BMI 40 Recommend weight loss outpatient with healthy dieting and regular physical activity   GERD Continue home PPI.         Advance Care Planning:   Code Status: Full Code    Consults: GI, general surgery.   Family Communication: none    Anticipate discharge possibly on 08/03/2021 or when general surgery signs off.   Status is: Inpatient  Patient requires at least 2 midnights for further evaluation and treatment of present condition.      Objective: Vitals:   08/02/21 1400 08/02/21 1415 08/02/21 1420 08/02/21 1430  BP: (!) 131/57 133/64  126/68  Pulse: 84 63 64 61  Resp:      Temp:    (!) 97.3 F (36.3 C)  TempSrc:      SpO2: 96% 94% (!) 89% 97%  Weight:      Height:        Intake/Output Summary (Last 24 hours) at 08/02/2021 1537 Last data filed at 08/02/2021 1400 Gross per 24 hour  Intake 1750 ml  Output 600 ml  Net 1150 ml   Filed Weights   08/01/21 1042  Weight: 107 kg    Exam:  General: 41 y.o. year-old female well-developed well-nourished in no  acute distress.  Alert oriented x3.   Cardiovascular: Regular rate and rhythm no rubs or gallop. Respiratory: Clear to auscultation no wheezes or rales.   Abdomen: No tenderness with palpation.  Bowel sounds present.  Nondistended.  Bowel sounds present. Musculoskeletal: No lower extremity edema bilaterally.. Skin: No ulcerative lesions or rashes. Psychiatry: Mood is appropriate for condition. Neuro: Moves  all 4 extremities.  Nonfocal exam.   Data Reviewed: CBC: Recent Labs  Lab 07/30/21 2108 07/31/21 0513 08/01/21 0340 08/02/21 0549  WBC 16.2* 7.9 7.2 7.9  NEUTROABS  --   --  4.2 4.7  HGB 13.5 12.6 12.2 12.8  HCT 42.4 39.1 37.7 39.0  MCV 84.1 84.8 85.7 84.8  PLT 328 274 271 [id number]   Basic Metabolic Panel: Recent Labs  Lab 07/30/21 2108 07/31/21 0513 08/01/21 0340 08/02/21 0549  NA 135 139 137 137  K 3.3* 3.6 3.7 3.8  CL 107 109 109 107  CO2 22 23 23 26   GLUCOSE 109* 96 105* 89  BUN 13 11 12  5*  CREATININE 0.75 0.68 0.84 0.66  CALCIUM 9.2 8.4* 8.3* 8.5*  MG  --  1.8 1.9 1.9  PHOS  --   --  3.5 3.1   GFR: Estimated Creatinine Clearance: 111.6 mL/min (by C-G formula based on SCr of 0.66 mg/dL). Liver Function Tests: Recent Labs  Lab 07/30/21 2108 07/31/21 0513 08/01/21 0340 08/02/21 0549  AST 96* 361* 134* 52*  ALT 62* 262* 221* 146*  ALKPHOS 64 79 90 86  BILITOT 1.0 1.4* 0.6 0.5  PROT 7.6 6.7 6.5 6.4*  ALBUMIN 4.2 3.6 3.4* 3.2*   Recent Labs  Lab 07/30/21 2108 08/02/21 0549  LIPASE 42 31   No results for input(s): AMMONIA in the last 168 hours. Coagulation Profile: No results for input(s): INR, PROTIME in the last 168 hours. Cardiac Enzymes: No results for input(s): CKTOTAL, CKMB, CKMBINDEX, TROPONINI in the last 168 hours. BNP (last 3 results) No results for input(s): PROBNP in the last 8760 hours. HbA1C: No results for input(s): HGBA1C in the last 72 hours. CBG: No results for input(s): GLUCAP in the last 168 hours. Lipid Profile: No results for input(s): CHOL, HDL, LDLCALC, TRIG, CHOLHDL, LDLDIRECT in the last 72 hours. Thyroid Function Tests: No results for input(s): TSH, T4TOTAL, FREET4, T3FREE, THYROIDAB in the last 72 hours. Anemia Panel: No results for input(s): VITAMINB12, FOLATE, FERRITIN, TIBC, IRON, RETICCTPCT in the last 72 hours. Urine analysis:    Component Value Date/Time   COLORURINE YELLOW (A) 07/30/2021 2324   APPEARANCEUR  CLEAR (A) 07/30/2021 2324   LABSPEC 1.025 07/30/2021 2324   PHURINE 6.0 07/30/2021 2324   GLUCOSEU NEGATIVE 07/30/2021 2324   HGBUR NEGATIVE 07/30/2021 2324   BILIRUBINUR MODERATE (A) 07/30/2021 2324   BILIRUBINUR negative 07/10/2019 1533   KETONESUR 40 (A) 07/30/2021 2324   PROTEINUR 100 (A) 07/30/2021 2324   UROBILINOGEN 0.2 07/10/2019 1533   NITRITE NEGATIVE 07/30/2021 2324   LEUKOCYTESUR NEGATIVE 07/30/2021 2324   Sepsis Labs: @LABRCNTIP ([id number])  ) Recent Results (from the past 240 hour(s))  Resp Panel by RT-PCR (Flu A&B, Covid) Nasopharyngeal Swab     Status: None   Collection Time: 07/31/21 12:19 AM   Specimen: Nasopharyngeal Swab; Nasopharyngeal(NP) swabs in vial transport medium  Result Value Ref Range Status   SARS Coronavirus 2 by RT PCR NEGATIVE NEGATIVE Final    Comment: (NOTE) SARS-CoV-2 target nucleic acids are NOT DETECTED.  The SARS-CoV-2 RNA is generally detectable in upper respiratory specimens during the  acute phase of infection. The lowest concentration of SARS-CoV-2 viral copies this assay can detect is 138 copies/mL. A negative result does not preclude SARS-Cov-2 infection and should not be used as the sole basis for treatment or other patient management decisions. A negative result may occur with  improper specimen collection/handling, submission of specimen other than nasopharyngeal swab, presence of viral mutation(s) within the areas targeted by this assay, and inadequate number of viral copies(<138 copies/mL). A negative result must be combined with clinical observations, patient history, and epidemiological information. The expected result is Negative.  Fact Sheet for Patients:  EntrepreneurPulse.com.au  Fact Sheet for Healthcare Providers:  IncredibleEmployment.be  This test is no t yet approved or cleared by the [place] FDA and  has been authorized for detection and/or diagnosis  of SARS-CoV-2 by FDA under an Emergency Use Authorization (EUA). This EUA will remain  in effect (meaning this test can be used) for the duration of the COVID-19 declaration under Section 564(b)(1) of the Act, 21 U.S.C.section 360bbb-3(b)(1), unless the authorization is terminated  or revoked sooner.       Influenza A by PCR NEGATIVE NEGATIVE Final   Influenza B by PCR NEGATIVE NEGATIVE Final    Comment: (NOTE) The Xpert Xpress SARS-CoV-2/FLU/RSV plus assay is intended as an aid in the diagnosis of influenza from Nasopharyngeal swab specimens and should not be used as a sole basis for treatment. Nasal washings and aspirates are unacceptable for Xpert Xpress SARS-CoV-2/FLU/RSV testing.  Fact Sheet for Patients: EntrepreneurPulse.com.au  Fact Sheet for Healthcare Providers: IncredibleEmployment.be  This test is not yet approved or cleared by the [place] FDA and has been authorized for detection and/or diagnosis of SARS-CoV-2 by FDA under an Emergency Use Authorization (EUA). This EUA will remain in effect (meaning this test can be used) for the duration of the COVID-19 declaration under Section 564(b)(1) of the Act, 21 U.S.C. section 360bbb-3(b)(1), unless the authorization is terminated or revoked.  Performed at [facility], [address] [place], [place] [address]   Surgical PCR screen     Status: Abnormal   Collection Time: 07/31/21  2:00 AM   Specimen: Nasal Mucosa; Nasal Swab  Result Value Ref Range Status   MRSA, PCR NEGATIVE NEGATIVE Final   Staphylococcus aureus POSITIVE (A) NEGATIVE Final    Comment: (NOTE) The Xpert SA Assay (FDA approved for NASAL specimens in patients 13 years of age and older), is one component of a comprehensive surveillance program. It is not intended to diagnose infection nor to guide or monitor treatment. Performed at [facility], [address], [place], [place] [address]    Urine Culture     Status: None   Collection Time: 07/31/21  1:40 PM   Specimen: Urine, Random  Result Value Ref Range Status   Specimen Description   Final    URINE, RANDOM Performed at [facility], [address], [place], [place] [address]    Special Requests   Final    NONE Performed at [facility], [address], [place], [place] [address]    Culture   Final    NO GROWTH Performed at [facility], [place] [address] [place], [place] [address]    Report Status 08/01/2021 FINAL  Final      Studies: No results found.  Scheduled Meds:  HYDROmorphone       [MAR Hold] lurasidone  60 mg Oral Daily   [MAR Hold] mupirocin ointment  1 application Nasal BID   [  MAR Hold] pantoprazole  40 mg Oral Daily   [MAR Hold] traZODone  200 mg Oral QHS    Continuous Infusions:  acetaminophen     [MAR Hold] cefTRIAXone (ROCEPHIN)  IV 2 g (08/01/21 2226)   lactated ringers 125 mL/hr at 08/02/21 0252     LOS: 2 days     Kayleen Memos, MD [facility] Pager [phone]  If 7PM-7AM, please contact night-coverage [email or web address] Password [facility] 08/02/2021, 3:37 PM

## 2021-08-02 NOTE — Anesthesia Postprocedure Evaluation (Signed)
Anesthesia Post Note  Patient: Brenda [name]  Procedure(s) Performed: ENDOSCOPIC RETROGRADE CHOLANGIOPANCREATOGRAPHY (ERCP)  Patient location during evaluation: PACU Anesthesia Type: General Level of consciousness: awake and alert Pain management: pain level controlled Vital Signs Assessment: post-procedure vital signs reviewed and stable Respiratory status: spontaneous breathing, nonlabored ventilation, respiratory function stable and patient connected to nasal cannula oxygen Cardiovascular status: blood pressure returned to baseline and stable Postop Assessment: no apparent nausea or vomiting Anesthetic complications: no   No notable events documented.   Last Vitals:  Vitals:   08/02/21 1430 08/02/21 1636  BP: 126/68 137/77  Pulse: 61 78  Resp:  16  Temp: (!) 36.3 C 37.1 C  SpO2: 97% 93%    Last Pain:  Vitals:   08/02/21 1640  TempSrc:   PainSc: 0-No pain                 Lenard Simmer

## 2021-08-03 ENCOUNTER — Other Ambulatory Visit: Payer: Self-pay | Admitting: Gastroenterology

## 2021-08-03 DIAGNOSIS — K8051 Calculus of bile duct without cholangitis or cholecystitis with obstruction: Secondary | ICD-10-CM

## 2021-08-03 LAB — COMPREHENSIVE METABOLIC PANEL
ALT: 112 U/L — ABNORMAL HIGH (ref 0–44)
AST: 37 U/L (ref 15–41)
Albumin: 3.2 g/dL — ABNORMAL LOW (ref 3.5–5.0)
Alkaline Phosphatase: 85 U/L (ref 38–126)
Anion gap: 4 — ABNORMAL LOW (ref 5–15)
BUN: 6 mg/dL (ref 6–20)
CO2: 25 mmol/L (ref 22–32)
Calcium: 8.7 mg/dL — ABNORMAL LOW (ref 8.9–10.3)
Chloride: 109 mmol/L (ref 98–111)
Creatinine, Ser: 0.68 mg/dL (ref 0.44–1.00)
GFR, Estimated: >60 mL/min (ref >60)
Glucose, Bld: 127 mg/dL — ABNORMAL HIGH (ref 70–99)
Potassium: 3.9 mmol/L (ref 3.5–5.1)
Sodium: 138 mmol/L (ref 135–145)
Total Bilirubin: 0.3 mg/dL (ref 0.3–1.2)
Total Protein: 6.4 g/dL — ABNORMAL LOW (ref 6.5–8.1)

## 2021-08-03 LAB — CBC WITH DIFFERENTIAL/PLATELET
Abs Immature Granulocytes: 0.07 10*3/uL (ref 0.00–0.07)
Basophils Absolute: 0 10*3/uL (ref 0.0–0.1)
Basophils Relative: 0 %
Eosinophils Absolute: 0 10*3/uL (ref 0.0–0.5)
Eosinophils Relative: 0 %
HCT: 38.1 % (ref 36.0–46.0)
Hemoglobin: 12 g/dL (ref 12.0–15.0)
Immature Granulocytes: 1 %
Lymphocytes Relative: 11 %
Lymphs Abs: 1.6 10*3/uL (ref 0.7–4.0)
MCH: 26.7 pg (ref 26.0–34.0)
MCHC: 31.5 g/dL (ref 30.0–36.0)
MCV: 84.7 fL (ref 80.0–100.0)
Monocytes Absolute: 0.8 10*3/uL (ref 0.1–1.0)
Monocytes Relative: 5 %
Neutro Abs: 12.3 10*3/uL — ABNORMAL HIGH (ref 1.7–7.7)
Neutrophils Relative %: 83 %
Platelets: 310 10*3/uL (ref 150–400)
RBC: 4.5 MIL/uL (ref 3.87–5.11)
RDW: 14.3 % (ref 11.5–15.5)
WBC: 14.7 10*3/uL — ABNORMAL HIGH (ref 4.0–10.5)
nRBC: 0 % (ref 0.0–0.2)

## 2021-08-03 LAB — SURGICAL PATHOLOGY

## 2021-08-03 MED ORDER — POLYETHYLENE GLYCOL 3350 17 G PO PACK: 17.0000 g | PACK | Freq: Every day | ORAL | 0 refills | Status: DC | PRN

## 2021-08-03 MED ORDER — OXYCODONE HCL 5 MG PO TABS: 5.0000 mg | ORAL_TABLET | ORAL | 0 refills | Status: DC | PRN

## 2021-08-03 MED ORDER — OXYCODONE HCL 5 MG PO TABS: 5.0000 mg | ORAL_TABLET | ORAL | Status: DC | PRN

## 2021-08-03 NOTE — Plan of Care (Signed)
Patient discharged to home with family.  Reviewed discharge instructions and packet given to patient who verbalized understanding.  PIV removed prior to discharge.

## 2021-08-03 NOTE — Progress Notes (Signed)
Patient ID: Brenda [name], female   DOB: 19-Mar-1981, 41 y.o.   MRN: [id number]     [facility] Day(s): 3.   Interval History: Patient seen and examined, no acute events or new complaints overnight. Patient reports patient well.  Patient endorses that pain is controlled.  She is tolerating diet.  She denies any nausea or vomiting.  Vital signs in last 24 hours: [min-max] current  Temp:  [97.3 F (36.3 C)-98.8 F (37.1 C)] 98.8 F (37.1 C) (02/08 0824) Pulse Rate:  [61-84] 78 (02/08 0824) Resp:  [15-17] 15 (02/08 0824) BP: (122-151)/(57-84) 151/84 (02/08 0824) SpO2:  [89 %-100 %] 96 % (02/08 0824)     Height: 5\' 4"  (162.6 cm) Weight: 107 kg BMI (Calculated): 40.47   Physical Exam:  Constitutional: alert, cooperative and no distress  Respiratory: breathing non-labored at rest  Cardiovascular: regular rate and sinus rhythm  Gastrointestinal: soft, non-tender, and non-distended  Labs:  CBC Latest Ref Rng & Units 08/03/2021 08/02/2021 08/01/2021  WBC 4.0 - 10.5 K/uL 14.7(H) 7.9 7.2  Hemoglobin 12.0 - 15.0 g/dL 12.0 12.8 12.2  Hematocrit 36.0 - 46.0 % 38.1 39.0 37.7  Platelets 150 - 400 K/uL 310 277 271   CMP Latest Ref Rng & Units 08/03/2021 08/02/2021 08/01/2021  Glucose 70 - 99 mg/dL 127(H) 89 105(H)  BUN 6 - 20 mg/dL 6 5(L) 12  Creatinine 0.44 - 1.00 mg/dL 0.68 0.66 0.84  Sodium 135 - 145 mmol/L 138 137 137  Potassium 3.5 - 5.1 mmol/L 3.9 3.8 3.7  Chloride 98 - 111 mmol/L 109 107 109  CO2 22 - 32 mmol/L 25 26 23   Calcium 8.9 - 10.3 mg/dL 8.7(L) 8.5(L) 8.3(L)  Total Protein 6.5 - 8.1 g/dL 6.4(L) 6.4(L) 6.5  Total Bilirubin 0.3 - 1.2 mg/dL 0.3 0.5 0.6  Alkaline Phos 38 - 126 U/L 85 86 90  AST 15 - 41 U/L 37 52(H) 134(H)  ALT 0 - 44 U/L 112(H) 146(H) 221(H)    Imaging studies: No new pertinent imaging studies   Assessment/Plan:  41 y.o. female with cholelithiasis and acute cholecystitis 1 Day Post-Op s/p robotic cholecystectomy.  Patient is doing well.   Pain controlled.  Tolerating diet.  No contraindication for discharge from a standpoint.  I will follow her in my office in 2 weeks.    Arnold Long, MD

## 2021-08-03 NOTE — Plan of Care (Signed)

## 2021-08-03 NOTE — Discharge Summary (Signed)
Physician Discharge Summary  Brenda [name] YF:[id number] DOB: 1980/10/30 DOA: 07/30/2021  PCP: Juline Patch, MD  Admit date: 07/30/2021 Discharge date: 08/03/2021  Admitted From: Home Disposition: Home  Recommendations for Outpatient Follow-up:  Follow up with PCP in 1-2 weeks Follow-up outpatient GI Follow-up outpatient general surgery  Home Health: No Equipment/Devices: None  Discharge Condition: Stable CODE STATUS: Full Diet recommendation: Soft/bland  Brief/Interim Summary: 41 y.o. female with medical history significant of HTN, chronic anxiety/depression, ADHD, who presents to [facility] ED with sudden onset right upper quadrant abdominal pain that started around 3 PM on the day of presentation to the ED.  It was associated with a single episode of nonbloody nonbilious emesis.  Work-up revealed cholelithiasis without evidence of acute cholecystitis and common bile duct diameter of 7 mm.  Due to concern for choledocholithiasis MRCP was obtained which revealed possible distal stone.  GI was consulted and plan is to perform ERCP on 08/01/2021.     General surgery was also consulted to consider elective cholecystectomy while inpatient to avoid recurrent symptomatic cholelithiasis/choledocholithiasis.  No evidence of acute cholangitis.  However patient reports that she is symptomatic from pyuria.  States she has a history of urinary tract infection and her symptoms are lower back pain.  Endorses lower back pain similar to previous UTI symptoms.  Urine culture obtained, started on Rocephin 2 g daily x3 days for presumptive UTI.  Urine culture returned negative no growth.     Post ERCP on 08/01/2021.  No complications, no evidence of pancreatitis.  LFTs are downtrending.  Status post laparoscopic cholecystectomy on 2/7.  Tolerated procedure well.  Seen postoperatively by Dr. Windell Moment.  No postprocedural complications.  Stable for discharge home at this time.  Patient will need to follow-up with  GI as well as general surgery postdischarge.  Patient will need repeat ERCP as a CBD stent remains in place.  As needed pain medication provided.  No indication for antibiotics.    Discharge Diagnoses:  Principal Problem:   Choledocholithiasis with obstruction Active Problems:   Essential hypertension   Anxiety   Hypokalemia  symptomatic choledocholithiasis with obstruction post ERCP on 08/01/2021 Presented with right upper quadrant abdominal pain, nausea and vomiting. Work-up revealed choledocholithiasis with transaminitis. ERCP on 08/01/2021.  No complications, no evidence of pancreatitis.  Lipase negative.  No epigastric pain.  No significant abdominal pain or nausea post ERCP. Status post laparoscopic cholecystectomy on 2/7.  CBD stent placed at this time.  No immediate complications.  Seen on postoperative day #1.  Cleared for discharge home.  Patient stable and pain-free.  Will discharge home.  As needed pain management provided.  No indication for antibiotics.  Follow-up outpatient GI and general surgery.  Essential hypertension BP stable. BP is at goal without oral antihypertensives     Discharge Instructions  Discharge Instructions     Diet - low sodium heart healthy   Complete by: As directed    Increase activity slowly   Complete by: As directed    No wound care   Complete by: As directed       Allergies as of 08/03/2021       Reactions   Ace Inhibitors Swelling   Lips/ face        Medication List     STOP taking these medications    ibuprofen 600 MG tablet Commonly known as: ADVIL   norethindrone 0.35 MG tablet Commonly known as: MICRONOR       TAKE these medications  ALPRAZolam 1 MG tablet Commonly known as: XANAX Take 1 mg by mouth 3 (three) times daily as needed for anxiety.   amphetamine-dextroamphetamine 20 MG tablet Commonly known as: ADDERALL Take 20 mg by mouth 2 (two) times daily. What changed: Another medication with the same  name was removed. Continue taking this medication, and follow the directions you see here.   labetalol 200 MG tablet Commonly known as: NORMODYNE Take 2 tablets (400 mg total) by mouth 2 (two) times daily.   Lurasidone HCl 60 MG Tabs Take 60 mg by mouth daily.   oxyCODONE 5 MG immediate release tablet Commonly known as: Oxy IR/ROXICODONE Take 1 tablet (5 mg total) by mouth every 4 (four) hours as needed for moderate pain.   pantoprazole 40 MG tablet Commonly known as: PROTONIX TAKE 1 TABLET BY MOUTH EVERY DAY   polyethylene glycol 17 g packet Commonly known as: MIRALAX / GLYCOLAX Take 17 g by mouth daily as needed for mild constipation.   traZODone 100 MG tablet Commonly known as: DESYREL Take 200 mg by mouth at bedtime.        Follow-up Information     Herbert Pun, MD. Go on 08/16/2021.   Specialty: General Surgery Why: Follow up after cholecystectomy 8:45am please take in a list of medications and insurance card Contact information: [place] [address] [phone]                Allergies  Allergen Reactions   Ace Inhibitors Swelling    Lips/ face    Consultations: GI Surgery   Procedures/Studies: MR ABDOMEN MRCP WO CONTRAST  Result Date: 07/31/2021 CLINICAL DATA:  Cholelithiasis. EXAM: MRI ABDOMEN WITHOUT CONTRAST  (INCLUDING MRCP) TECHNIQUE: Multiplanar multisequence MR imaging of the abdomen was performed. Heavily T2-weighted images of the biliary and pancreatic ducts were obtained, and three-dimensional MRCP images were rendered by post processing. COMPARISON:  Ultrasound exam earlier same day. CT stone study 03/09/2018. FINDINGS: Lower chest: Unremarkable. Hepatobiliary: No suspicious focal abnormality within the liver parenchyma. Multiple tiny gallstones evident in the gallbladder demonstrating a prominent fold. Most of the stones measure in the 4-7 mm size range. No intrahepatic biliary duct dilatation. Extrahepatic  common bile duct measures 5-6 mm. There does appear to be a tiny 2-3 mm dependent stone in the distal common bile duct (axial T2 image 19 of series 9 in best demonstrated on 3D MRCP image 96 of series 18). No gallbladder wall thickening. Possible trace pericholecystic edema. Pancreas: No focal mass lesion. No dilatation of the main duct. No intraparenchymal cyst. No peripancreatic edema. Spleen:  No splenomegaly. No focal mass lesion. Adrenals/Urinary Tract: No adrenal nodule or mass. Kidneys unremarkable. Stomach/Bowel: Stomach is unremarkable. No gastric wall thickening. No evidence of outlet obstruction. Duodenum is normally positioned as is the ligament of Treitz. No small bowel or colonic dilatation within the visualized abdomen. Vascular/Lymphatic: No abdominal aortic aneurysm. 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 There is no gastrohepatic or hepatoduodenal ligament lymphadenopathy. No retroperitoneal or mesenteric lymphadenopathy. Other:  No intraperitoneal free fluid. Musculoskeletal: No suspicious marrow signal abnormality. IMPRESSION: 1. Cholelithiasis with a prominent fold in the gallbladder. Possible trace pericholecystic edema. No gallbladder wall thickening. 2. Tiny 2-3 mm stone in the distal common bile duct. No intrahepatic biliary duct dilatation. 3. 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 Electronically Signed   By: Misty Stanley M.D.   On: 07/31/2021 05:14   DG C-Arm 1-60 Min-No Report  Result Date: 08/01/2021 Fluoroscopy was  utilized by the requesting physician.  No radiographic interpretation.   US ABDOMEN LIMITED RUQ (LIVER/GB)  Result Date: 07/30/2021 CLINICAL DATA:  Right upper quadrant pain. EXAM: ULTRASOUND ABDOMEN LIMITED RIGHT UPPER QUADRANT COMPARISON:  None. FINDINGS: Gallbladder: Multiple small stones are demonstrated in the gallbladder. No gallbladder wall thickening or edema. Murphy's sign is negative. Common bile duct: Diameter: 7 mm, normal  Liver: Mildly increased parenchymal echotexture suggesting fatty infiltration. No focal lesions identified. Incomplete visualization due to rib shadowing. Portal vein is patent on color Doppler imaging with normal direction of blood flow towards the liver. Other: None. IMPRESSION: Cholelithiasis without additional evidence of acute cholecystitis. Electronically Signed   By: Lucienne Capers M.D.   On: 07/30/2021 20:52      Subjective: Seen and examined on day of discharge.  Stable in no distress.  Tolerating p.o. intake.  Stable for discharge home.  Discharge Exam: Vitals:   08/03/21 0510 08/03/21 0824  BP: 122/66 (!) 151/84  Pulse: 61 78  Resp: 17 15  Temp: 98.1 F (36.7 C) 98.8 F (37.1 C)  SpO2: 94% 96%   Vitals:   08/02/21 1636 08/02/21 1932 08/03/21 0510 08/03/21 0824  BP: 137/77 140/73 122/66 (!) 151/84  Pulse: 78 72 61 78  Resp: 16 16 17 15   Temp: 98.7 F (37.1 C) 98.7 F (37.1 C) 98.1 F (36.7 C) 98.8 F (37.1 C)  TempSrc:      SpO2: 93% 94% 94% 96%  Weight:      Height:        General: Pt is alert, awake, not in acute distress Cardiovascular: RRR, S1/S2 +, no rubs, no gallops Respiratory: CTA bilaterally, no wheezing, no rhonchi Abdominal: Soft, NT, ND, bowel sounds + Extremities: no edema, no cyanosis    The results of significant diagnostics from this hospitalization (including imaging, microbiology, ancillary and laboratory) are listed below for reference.     Microbiology: Recent Results (from the past 240 hour(s))  Resp Panel by RT-PCR (Flu A&B, Covid) Nasopharyngeal Swab     Status: None   Collection Time: 07/31/21 12:19 AM   Specimen: Nasopharyngeal Swab; Nasopharyngeal(NP) swabs in vial transport medium  Result Value Ref Range Status   SARS Coronavirus 2 by RT PCR NEGATIVE NEGATIVE Final    Comment: (NOTE) SARS-CoV-2 target nucleic acids are NOT DETECTED.  The SARS-CoV-2 RNA is generally detectable in upper respiratory specimens during the  acute phase of infection. The lowest concentration of SARS-CoV-2 viral copies this assay can detect is 138 copies/mL. A negative result does not preclude SARS-Cov-2 infection and should not be used as the sole basis for treatment or other patient management decisions. A negative result may occur with  improper specimen collection/handling, submission of specimen other than nasopharyngeal swab, presence of viral mutation(s) within the areas targeted by this assay, and inadequate number of viral copies(<138 copies/mL). A negative result must be combined with clinical observations, patient history, and epidemiological information. The expected result is Negative.  Fact Sheet for Patients:  EntrepreneurPulse.com.au  Fact Sheet for Healthcare Providers:  IncredibleEmployment.be  This test is no t yet approved or cleared by the [place] FDA and  has been authorized for detection and/or diagnosis of SARS-CoV-2 by FDA under an Emergency Use Authorization (EUA). This EUA will remain  in effect (meaning this test can be used) for the duration of the COVID-19 declaration under Section 564(b)(1) of the Act, 21 U.S.C.section 360bbb-3(b)(1), unless the authorization is terminated  or revoked sooner.  Influenza A by PCR NEGATIVE NEGATIVE Final   Influenza B by PCR NEGATIVE NEGATIVE Final    Comment: (NOTE) The Xpert Xpress SARS-CoV-2/FLU/RSV plus assay is intended as an aid in the diagnosis of influenza from Nasopharyngeal swab specimens and should not be used as a sole basis for treatment. Nasal washings and aspirates are unacceptable for Xpert Xpress SARS-CoV-2/FLU/RSV testing.  Fact Sheet for Patients: EntrepreneurPulse.com.au  Fact Sheet for Healthcare Providers: IncredibleEmployment.be  This test is not yet approved or cleared by the [place] FDA and has been authorized for detection and/or  diagnosis of SARS-CoV-2 by FDA under an Emergency Use Authorization (EUA). This EUA will remain in effect (meaning this test can be used) for the duration of the COVID-19 declaration under Section 564(b)(1) of the Act, 21 U.S.C. section 360bbb-3(b)(1), unless the authorization is terminated or revoked.  Performed at [facility], [address], [place], [place] [address]   Surgical PCR screen     Status: Abnormal   Collection Time: 07/31/21  2:00 AM   Specimen: Nasal Mucosa; Nasal Swab  Result Value Ref Range Status   MRSA, PCR NEGATIVE NEGATIVE Final   Staphylococcus aureus POSITIVE (A) NEGATIVE Final    Comment: (NOTE) The Xpert SA Assay (FDA approved for NASAL specimens in patients 84 years of age and older), is one component of a comprehensive surveillance program. It is not intended to diagnose infection nor to guide or monitor treatment. Performed at [facility], [address] [place], [place] [address]   Urine Culture     Status: None   Collection Time: 07/31/21  1:40 PM   Specimen: Urine, Random  Result Value Ref Range Status   Specimen Description   Final    URINE, RANDOM Performed at [facility], [address] [place], [place] [address]    Special Requests   Final    NONE Performed at [facility], [address], [place], [place] [address]    Culture   Final    NO GROWTH Performed at [facility], [place] [address], [place], [place] [address]    Report Status 08/01/2021 FINAL  Final     Labs: BNP (last 3 results) No results for input(s): BNP in the last 8760 hours. Basic Metabolic Panel: Recent Labs  Lab 07/30/21 2108 07/31/21 0513 08/01/21 0340 08/02/21 0549 08/03/21 0402  NA 135 139 137 137 138  K 3.3* 3.6 3.7 3.8 3.9  CL 107 109 109 107 109  CO2 22 23 23 26 25   GLUCOSE 109* 96 105* 89 127*  BUN 13 11 12  5* 6  CREATININE 0.75 0.68 0.84 0.66 0.68  CALCIUM 9.2 8.4* 8.3* 8.5* 8.7*  MG  --  1.8  1.9 1.9  --   PHOS  --   --  3.5 3.1  --    Liver Function Tests: Recent Labs  Lab 07/30/21 2108 07/31/21 0513 08/01/21 0340 08/02/21 0549 08/03/21 0402  AST 96* 361* 134* 52* 37  ALT 62* 262* 221* 146* 112*  ALKPHOS 64 79 90 86 85  BILITOT 1.0 1.4* 0.6 0.5 0.3  PROT 7.6 6.7 6.5 6.4* 6.4*  ALBUMIN 4.2 3.6 3.4* 3.2* 3.2*   Recent Labs  Lab 07/30/21 2108 08/02/21 0549  LIPASE 42 31   No results for input(s): AMMONIA in the last 168 hours. CBC: Recent Labs  Lab 07/30/21 2108 07/31/21 0513 08/01/21 0340 08/02/21 0549 08/03/21 0402  WBC 16.2* 7.9 7.2 7.9 14.7*  NEUTROABS  --   --  4.2 4.7 12.3*  HGB 13.5 12.6 12.2 12.8 12.0  HCT 42.4 39.1 37.7 39.0 38.1  MCV 84.1 84.8 85.7 84.8 84.7  PLT 328 274 271 277 310   Cardiac Enzymes: No results for input(s): CKTOTAL, CKMB, CKMBINDEX, TROPONINI in the last 168 hours. BNP: Invalid input(s): POCBNP CBG: No results for input(s): GLUCAP in the last 168 hours. D-Dimer No results for input(s): DDIMER in the last 72 hours. Hgb A1c No results for input(s): HGBA1C in the last 72 hours. Lipid Profile No results for input(s): CHOL, HDL, LDLCALC, TRIG, CHOLHDL, LDLDIRECT in the last 72 hours. Thyroid function studies No results for input(s): TSH, T4TOTAL, T3FREE, THYROIDAB in the last 72 hours.  Invalid input(s): FREET3 Anemia work up No results for input(s): VITAMINB12, FOLATE, FERRITIN, TIBC, IRON, RETICCTPCT in the last 72 hours. Urinalysis    Component Value Date/Time   COLORURINE YELLOW (A) 07/30/2021 2324   APPEARANCEUR CLEAR (A) 07/30/2021 2324   LABSPEC 1.025 07/30/2021 2324   PHURINE 6.0 07/30/2021 2324   GLUCOSEU NEGATIVE 07/30/2021 2324   HGBUR NEGATIVE 07/30/2021 2324   BILIRUBINUR MODERATE (A) 07/30/2021 2324   BILIRUBINUR negative 07/10/2019 1533   KETONESUR 40 (A) 07/30/2021 2324   PROTEINUR 100 (A) 07/30/2021 2324   UROBILINOGEN 0.2 07/10/2019 1533   NITRITE NEGATIVE 07/30/2021 2324   LEUKOCYTESUR  NEGATIVE 07/30/2021 2324   Sepsis Labs Invalid input(s): PROCALCITONIN,  WBC,  LACTICIDVEN Microbiology Recent Results (from the past 240 hour(s))  Resp Panel by RT-PCR (Flu A&B, Covid) Nasopharyngeal Swab     Status: None   Collection Time: 07/31/21 12:19 AM   Specimen: Nasopharyngeal Swab; Nasopharyngeal(NP) swabs in vial transport medium  Result Value Ref Range Status   SARS Coronavirus 2 by RT PCR NEGATIVE NEGATIVE Final    Comment: (NOTE) SARS-CoV-2 target nucleic acids are NOT DETECTED.  The SARS-CoV-2 RNA is generally detectable in upper respiratory specimens during the acute phase of infection. The lowest concentration of SARS-CoV-2 viral copies this assay can detect is 138 copies/mL. A negative result does not preclude SARS-Cov-2 infection and should not be used as the sole basis for treatment or other patient management decisions. A negative result may occur with  improper specimen collection/handling, submission of specimen other than nasopharyngeal swab, presence of viral mutation(s) within the areas targeted by this assay, and inadequate number of viral copies(<138 copies/mL). A negative result must be combined with clinical observations, patient history, and epidemiological information. The expected result is Negative.  Fact Sheet for Patients:  EntrepreneurPulse.com.au  Fact Sheet for Healthcare Providers:  IncredibleEmployment.be  This test is no t yet approved or cleared by the [place] FDA and  has been authorized for detection and/or diagnosis of SARS-CoV-2 by FDA under an Emergency Use Authorization (EUA). This EUA will remain  in effect (meaning this test can be used) for the duration of the COVID-19 declaration under Section 564(b)(1) of the Act, 21 U.S.C.section 360bbb-3(b)(1), unless the authorization is terminated  or revoked sooner.       Influenza A by PCR NEGATIVE NEGATIVE Final   Influenza B by PCR  NEGATIVE NEGATIVE Final    Comment: (NOTE) The Xpert Xpress SARS-CoV-2/FLU/RSV plus assay is intended as an aid in the diagnosis of influenza from Nasopharyngeal swab specimens and should not be used as a sole basis for treatment. Nasal washings and aspirates are unacceptable for Xpert Xpress SARS-CoV-2/FLU/RSV testing.  Fact Sheet for Patients: EntrepreneurPulse.com.au  Fact Sheet for Healthcare Providers: IncredibleEmployment.be  This test is not yet approved  or cleared by the [place] and has been authorized for detection and/or diagnosis of SARS-CoV-2 by FDA under an Emergency Use Authorization (EUA). This EUA will remain in effect (meaning this test can be used) for the duration of the COVID-19 declaration under Section 564(b)(1) of the Act, 21 U.S.C. section 360bbb-3(b)(1), unless the authorization is terminated or revoked.  Performed at [facility], [address] [place], [place] [address]   Surgical PCR screen     Status: Abnormal   Collection Time: 07/31/21  2:00 AM   Specimen: Nasal Mucosa; Nasal Swab  Result Value Ref Range Status   MRSA, PCR NEGATIVE NEGATIVE Final   Staphylococcus aureus POSITIVE (A) NEGATIVE Final    Comment: (NOTE) The Xpert SA Assay (FDA approved for NASAL specimens in patients 28 years of age and older), is one component of a comprehensive surveillance program. It is not intended to diagnose infection nor to guide or monitor treatment. Performed at [facility], [address] [place], [place] [address]   Urine Culture     Status: None   Collection Time: 07/31/21  1:40 PM   Specimen: Urine, Random  Result Value Ref Range Status   Specimen Description   Final    URINE, RANDOM Performed at [facility], [address], [place], [place] [address]    Special Requests   Final    NONE Performed at [facility], [address], [place], [place]  [address]    Culture   Final    NO GROWTH Performed at [facility], [place] [address], [place], [place] [address]    Report Status 08/01/2021 FINAL  Final     Time coordinating discharge: Over 30 minutes  SIGNED:   Sidney Ace, MD  [facility] 08/03/2021, 4:23 PM Pager   If 7PM-7AM, please contact night-coverage

## 2021-08-03 NOTE — Anesthesia Postprocedure Evaluation (Signed)
Anesthesia Post Note  Patient: Brenda [name]  Procedure(s) Performed: XI ROBOTIC ASSISTED LAPAROSCOPIC CHOLECYSTECTOMY INDOCYANINE GREEN FLUORESCENCE IMAGING (ICG)  Patient location during evaluation: PACU Anesthesia Type: General Level of consciousness: awake and alert Pain management: pain level controlled Vital Signs Assessment: post-procedure vital signs reviewed and stable Respiratory status: spontaneous breathing, nonlabored ventilation and respiratory function stable Cardiovascular status: blood pressure returned to baseline and stable Postop Assessment: no apparent nausea or vomiting Anesthetic complications: no   No notable events documented.   Last Vitals:  Vitals:   08/03/21 0510 08/03/21 0824  BP: 122/66 (!) 151/84  Pulse: 61 78  Resp: 17 15  Temp: 36.7 C 37.1 C  SpO2: 94% 96%    Last Pain:  Vitals:   08/03/21 0824  TempSrc:   PainSc: 4                  Foye Deer

## 2021-08-03 NOTE — Discharge Instructions (Signed)

## 2021-08-03 NOTE — Telephone Encounter (Signed)
ERCP scheduled for September 13, 2021

## 2021-08-17 ENCOUNTER — Other Ambulatory Visit: Payer: Self-pay | Admitting: General Surgery

## 2021-08-17 DIAGNOSIS — Z9049 Acquired absence of other specified parts of digestive tract: Secondary | ICD-10-CM

## 2021-08-17 DIAGNOSIS — R1011 Right upper quadrant pain: Secondary | ICD-10-CM

## 2021-08-30 ENCOUNTER — Ambulatory Visit: Admission: RE | Admit: 2021-08-30 | Payer: BC Managed Care – PPO | Source: Ambulatory Visit

## 2021-09-09 ENCOUNTER — Other Ambulatory Visit: Payer: Self-pay

## 2021-09-09 ENCOUNTER — Ambulatory Visit
Admission: RE | Admit: 2021-09-09 | Discharge: 2021-09-09 | Disposition: A | Discharge status: Home / Self Care | Payer: BC Managed Care – PPO | Source: Ambulatory Visit | Attending: General Surgery | Admitting: General Surgery

## 2021-09-09 DIAGNOSIS — Z9049 Acquired absence of other specified parts of digestive tract: Secondary | ICD-10-CM | POA: Insufficient documentation

## 2021-09-09 DIAGNOSIS — R1011 Right upper quadrant pain: Secondary | ICD-10-CM | POA: Insufficient documentation

## 2021-09-09 DIAGNOSIS — R634 Abnormal weight loss: Secondary | ICD-10-CM | POA: Diagnosis not present

## 2021-09-09 DIAGNOSIS — R109 Unspecified abdominal pain: Secondary | ICD-10-CM | POA: Diagnosis not present

## 2021-09-09 IMAGING — CT CT ABD-PELV W/ CM
2 of 5 series · 15 of 46 positions shown, 17 images · IV contrast (agent unspecified)
Comparison: [DATE], [DATE]

CLINICAL DATA: Continued abdominal pain cholecystectomy. Weight
loss

EXAM:
CT ABDOMEN AND PELVIS WITH CONTRAST
TECHNIQUE: Multidetector CT imaging of the abdomen and pelvis was performed
using the standard protocol following bolus administration of
intravenous contrast.

[Series 2: abd pelvis 5.00 · axial · 0.88mm/px · z∈[-1482,-1052]mm · 12 of 102 slices shown, 14 images]
[im 8/102  soft-tissue]
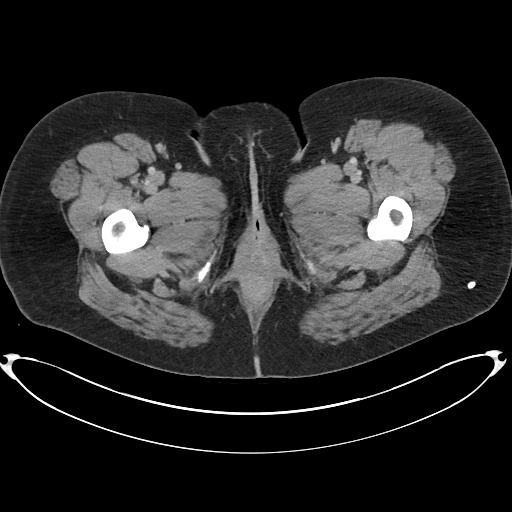
[im 8/102  bone]
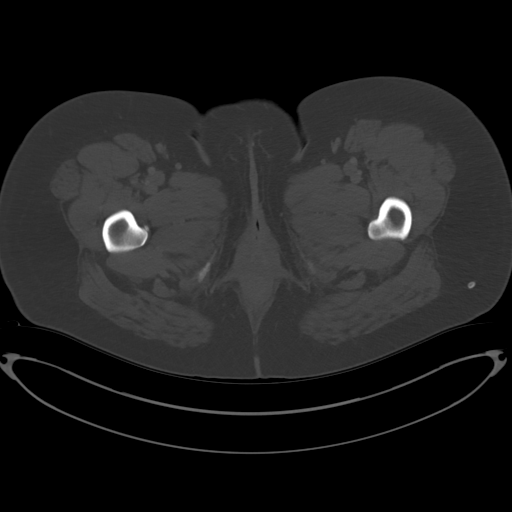
[im 16/102  soft-tissue]
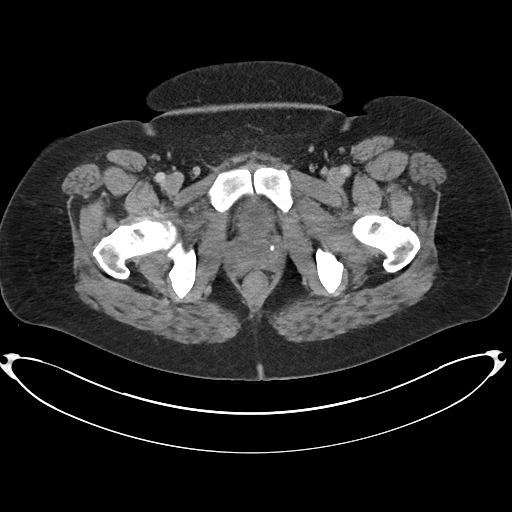
[im 24/102  soft-tissue]
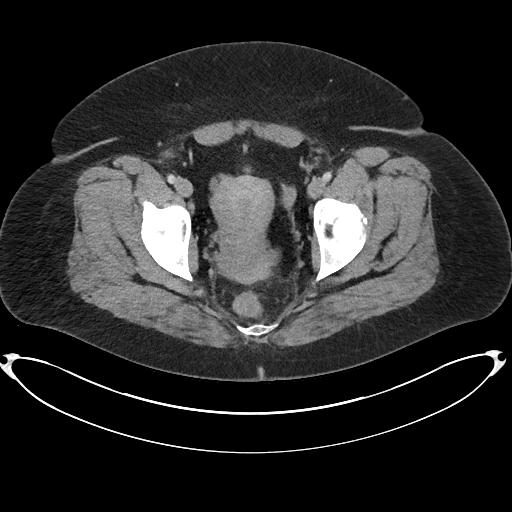
[im 32/102  soft-tissue]
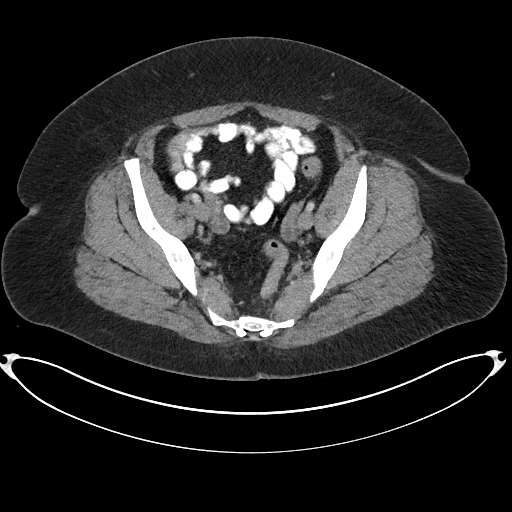
[im 39/102  soft-tissue]
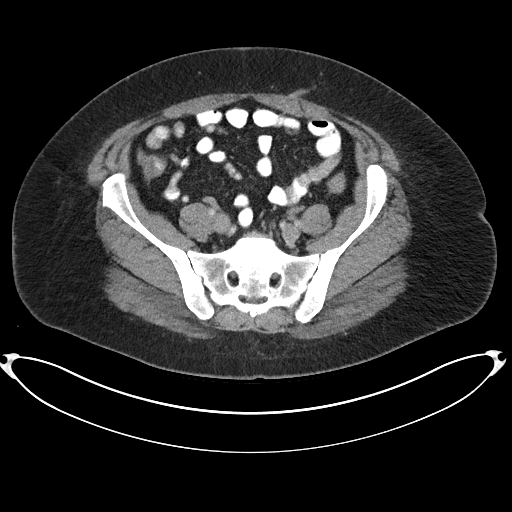
[im 47/102  soft-tissue]
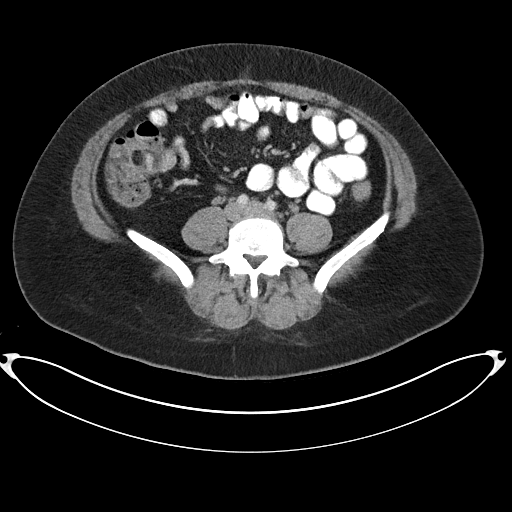
[im 55/102  soft-tissue]
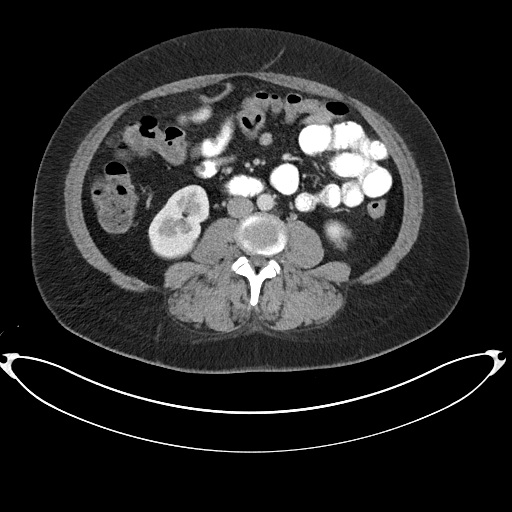
[im 63/102  soft-tissue]
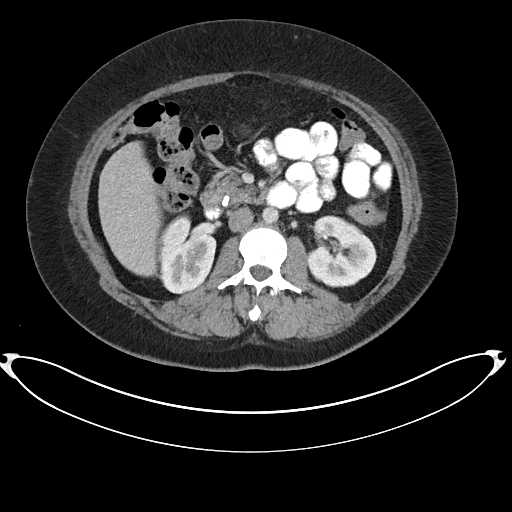
[im 70/102  soft-tissue]
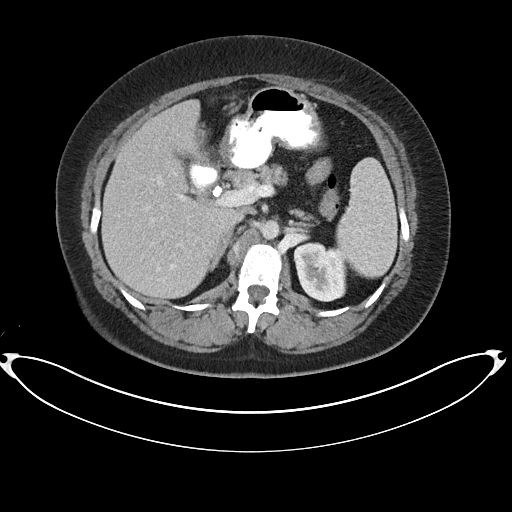
[im 70/102  bone]
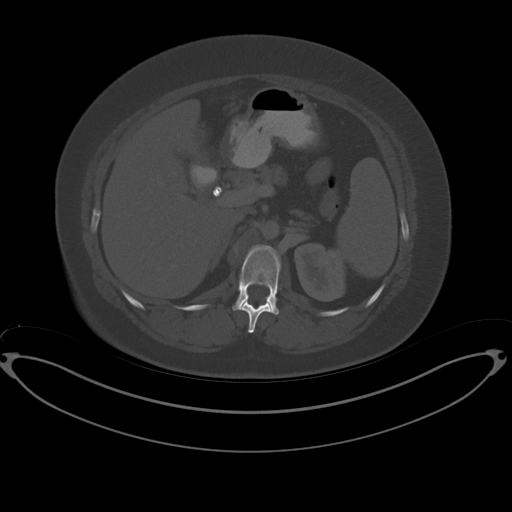
[im 78/102  soft-tissue]
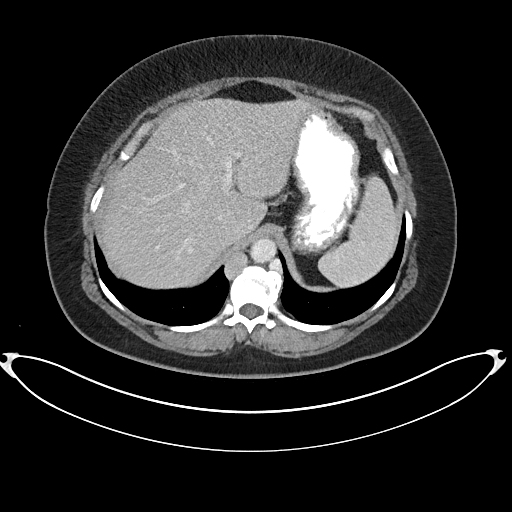
[im 86/102  soft-tissue]
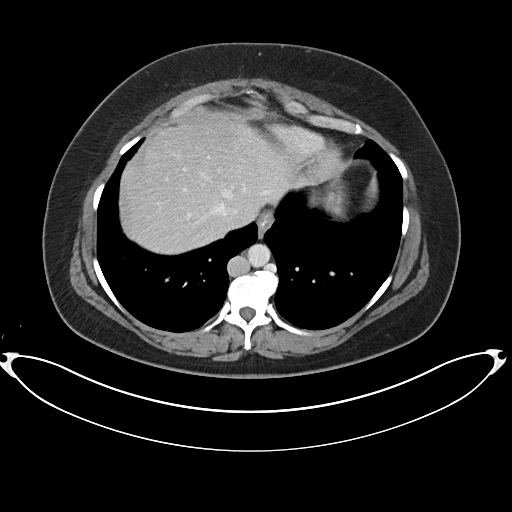
[im 94/102  soft-tissue]
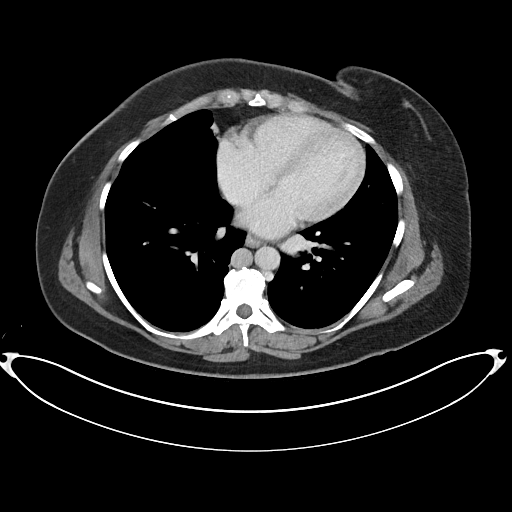

[Series 4: coronals abd pelvis 2.00 cor · coronal · 0.88mm/px · 3 of 163 slices shown]
[im 55/163  soft-tissue]
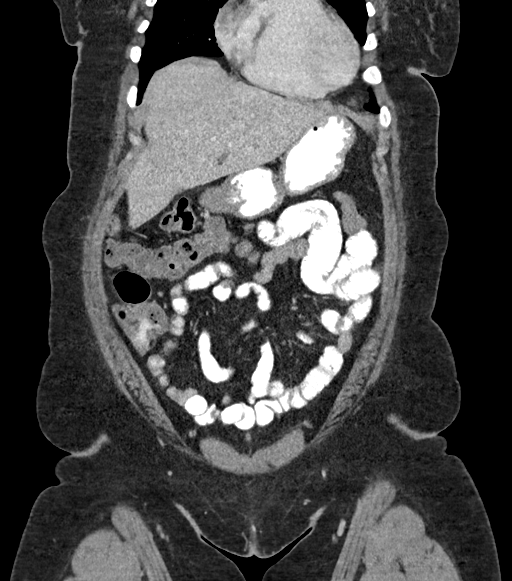
[im 73/163  soft-tissue]
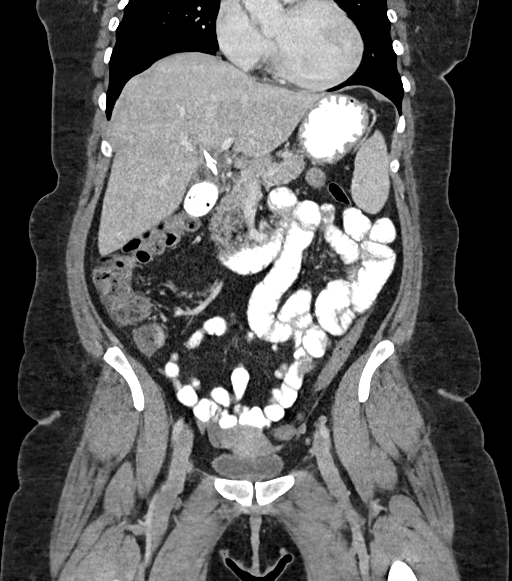
[im 91/163  soft-tissue]
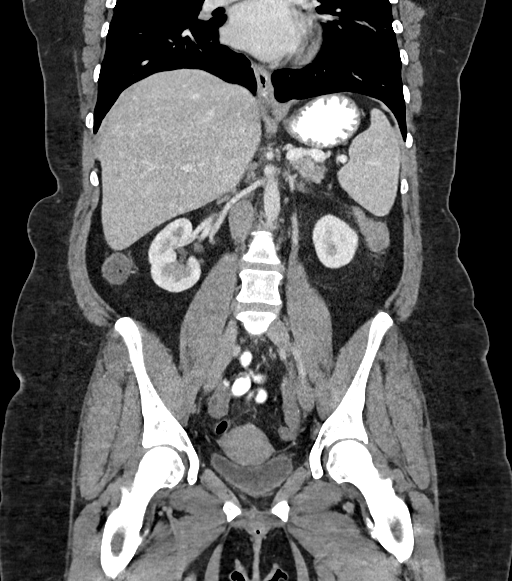

[15 of 46 positions shown; findings below may reference images not displayed]

RADIATION DOSE REDUCTION: This exam was performed according to the
departmental dose-optimization program which includes automated
exposure control, adjustment of the mA and/or kV according to
patient size and/or use of iterative reconstruction technique.

CONTRAST:  100mL OMNIPAQUE IOHEXOL 350 MG/ML SOLN
FINDINGS: Lower chest: Lingular scarring. Normal heart size. No pericardial or
pleural effusion. Azygos continuation of IVC, normal variant as
before.

Hepatobiliary: Cholecystectomy. 2 endoscopic common bile duct stents
noted appearing appropriately position. No biliary dilatation or
obstruction pattern. No gallbladder fossa fluid collection. No focal
hepatic abnormality. Hepatic and portal veins remain patent.

Pancreas: Unremarkable. No pancreatic ductal dilatation or
surrounding inflammatory changes.

Spleen: Normal in size without focal abnormality.

Adrenals/Urinary Tract: Adrenal glands are unremarkable. Kidneys are
normal, without renal calculi, focal lesion, or hydronephrosis.
Bladder is unremarkable.

Stomach/Bowel: Stomach is within normal limits. Appendix appears
normal. No evidence of bowel wall thickening, distention, or
inflammatory changes.

Vascular/Lymphatic: No significant vascular findings are present. No
enlarged abdominal or pelvic lymph nodes.

Reproductive: Uterus and bilateral adnexa are unremarkable.

Other: No abdominal wall hernia or abnormality. No abdominopelvic
ascites.

Musculoskeletal: Degenerative changes noted of the thoracic spine.
Slight thoracolumbar scoliosis. No acute osseous finding.
IMPRESSION: Status post cholecystectomy. Two endoscopic common bile duct stents
appear appropriately position. No complicating feature as detailed
above.

No other acute intra-abdominopelvic finding by CT.

Azygos continuation of the IVC, normal variant.

## 2021-09-09 MED ORDER — IOHEXOL 350 MG/ML SOLN
100.0000 mL | Freq: Once | INTRAVENOUS | Status: AC | PRN
  Administered 2021-09-09: 100 mL via INTRAVENOUS

## 2021-09-12 ENCOUNTER — Encounter: Payer: Self-pay | Admitting: Gastroenterology

## 2021-09-13 ENCOUNTER — Ambulatory Visit: Payer: BC Managed Care – PPO | Admitting: Anesthesiology

## 2021-09-13 ENCOUNTER — Ambulatory Visit
Admission: RE | Admit: 2021-09-13 | Discharge: 2021-09-13 | Disposition: A | Discharge status: Home / Self Care | Payer: BC Managed Care – PPO | Source: Ambulatory Visit | Attending: Gastroenterology | Admitting: Gastroenterology

## 2021-09-13 ENCOUNTER — Encounter: Payer: Self-pay | Admitting: Gastroenterology

## 2021-09-13 ENCOUNTER — Ambulatory Visit: Payer: BC Managed Care – PPO

## 2021-09-13 ENCOUNTER — Encounter
Admission: RE | Disposition: A | Discharge status: Home / Self Care | Payer: Self-pay | Source: Ambulatory Visit | Attending: Gastroenterology

## 2021-09-13 DIAGNOSIS — K219 Gastro-esophageal reflux disease without esophagitis: Secondary | ICD-10-CM | POA: Insufficient documentation

## 2021-09-13 DIAGNOSIS — I10 Essential (primary) hypertension: Secondary | ICD-10-CM | POA: Insufficient documentation

## 2021-09-13 DIAGNOSIS — E669 Obesity, unspecified: Secondary | ICD-10-CM | POA: Insufficient documentation

## 2021-09-13 DIAGNOSIS — F909 Attention-deficit hyperactivity disorder, unspecified type: Secondary | ICD-10-CM | POA: Diagnosis not present

## 2021-09-13 DIAGNOSIS — Z6838 Body mass index (BMI) 38.0-38.9, adult: Secondary | ICD-10-CM | POA: Diagnosis not present

## 2021-09-13 DIAGNOSIS — F418 Other specified anxiety disorders: Secondary | ICD-10-CM | POA: Diagnosis not present

## 2021-09-13 DIAGNOSIS — Z4659 Encounter for fitting and adjustment of other gastrointestinal appliance and device: Secondary | ICD-10-CM | POA: Insufficient documentation

## 2021-09-13 DIAGNOSIS — K8051 Calculus of bile duct without cholangitis or cholecystitis with obstruction: Secondary | ICD-10-CM

## 2021-09-13 HISTORY — PX: ERCP: SHX5425

## 2021-09-13 LAB — POCT PREGNANCY, URINE: Preg Test, Ur: NEGATIVE

## 2021-09-13 SURGERY — ERCP, WITH INTERVENTION IF INDICATED
Anesthesia: General

## 2021-09-13 MED ORDER — DEXMEDETOMIDINE HCL IN NACL 200 MCG/50ML IV SOLN
INTRAVENOUS | Status: DC | PRN
  Administered 2021-09-13: 12 ug via INTRAVENOUS

## 2021-09-13 MED ORDER — PROPOFOL 500 MG/50ML IV EMUL
INTRAVENOUS | Status: DC | PRN
Start: 2021-09-13 — End: 2021-09-13
  Administered 2021-09-13: 150 ug/kg/min via INTRAVENOUS

## 2021-09-13 MED ORDER — GLYCOPYRROLATE 0.2 MG/ML IJ SOLN
INTRAMUSCULAR | Status: DC | PRN
  Administered 2021-09-13: .2 mg via INTRAVENOUS

## 2021-09-13 MED ORDER — LIDOCAINE HCL (CARDIAC) PF 100 MG/5ML IV SOSY
PREFILLED_SYRINGE | INTRAVENOUS | Status: DC | PRN
  Administered 2021-09-13: 100 mg via INTRAVENOUS

## 2021-09-13 MED ORDER — SCOPOLAMINE 1 MG/3DAYS TD PT72: 1.0000 | MEDICATED_PATCH | Freq: Once | TRANSDERMAL | Status: DC

## 2021-09-13 MED ORDER — PROPOFOL 10 MG/ML IV BOLUS
INTRAVENOUS | Status: DC | PRN
  Administered 2021-09-13: 100 mg via INTRAVENOUS
  Administered 2021-09-13: 150 mg via INTRAVENOUS

## 2021-09-13 MED ORDER — SODIUM CHLORIDE 0.9 % IV SOLN: INTRAVENOUS | Status: DC

## 2021-09-13 NOTE — Op Note (Signed)
[facility] ?Gastroenterology ?Patient Name: Brenda [name] ?Procedure Date: 09/13/2021 10:56 AM ?MRN: [id number] ?Account #: [id number] ?Date of Birth: May 12, 1981 ?Admit Type: Outpatient ?Age: 41 ?Room: [facility] ENDO ROOM 4 ?Gender: Female ?Note Status: Finalized ?Instrument Name: EXALT Ercp scope ?Procedure:             ERCP ?Indications:           Stent removal ?Providers:             Lucilla Lame MD, MD ?Referring MD:          Juline Patch, MD (Referring MD) ?Medicines:             Propofol per Anesthesia ?Complications:         No immediate complications. ?Procedure:             Pre-Anesthesia Assessment: ?                       - Prior to the procedure, a History and Physical was  ?                       performed, and patient medications and allergies were  ?                       reviewed. The patient's tolerance of previous  ?                       anesthesia was also reviewed. The risks and benefits  ?                       of the procedure and the sedation options and risks  ?                       were discussed with the patient. All questions were  ?                       answered, and informed consent was obtained. Prior  ?                       Anticoagulants: The patient has taken no previous  ?                       anticoagulant or antiplatelet agents. ASA Grade  ?                       Assessment: II - A patient with mild systemic disease.  ?                       After reviewing the risks and benefits, the patient  ?                       was deemed in satisfactory condition to undergo the  ?                       procedure. ?                       After obtaining informed consent, the scope was passed  ?  under direct vision. Throughout the procedure, the  ?                       patient's blood pressure, pulse, and oxygen  ?                       saturations were monitored continuously. The [place]  ?                       Scientific [employer] single  use duodenoscope was  ?                       introduced through the mouth, and used to inject  ?                       contrast into and used to inject contrast into the  ?                       bile duct. The ERCP was accomplished without  ?                       difficulty. The patient tolerated the procedure well. ?Findings: ?     A scout film of the abdomen was obtained. Two stents ending in the main  ?     bile duct were seen. Two stents were removed from the biliary tree using  ?     a snare. The stents were found to be patent via the water column test. A  ?     wire was passed into the biliary tree. The biliary tree was swept with a  ?     15 mm balloon starting at the bifurcation. Sludge was swept from the  ?     duct. ?Impression:            - Two stents were removed from the biliary tree. ?                       - The biliary tree was swept and sludge was found. ?Recommendation:        - Discharge patient to home. ?                       - Resume previous diet. ?                       - Continue present medications. ?                       - Watch for pancreatitis, bleeding, perforation, and  ?                       cholangitis. ?Procedure Code(s):     --- Professional --- ?                       [phone], Endoscopic retrograde cholangiopancreatography  ?                       (ERCP); with removal of foreign body(s) or stent(s)  ?  from biliary/pancreatic duct(s) ?                       [id number], Endoscopic retrograde cholangiopancreatography  ?                       (ERCP); with removal of calculi/debris from  ?                       biliary/pancreatic duct(s) ?Diagnosis Code(s):     --- Professional --- ?                       [id number], Encounter for fitting and adjustment of other  ?                       gastrointestinal appliance and device ?CPT copyright 2019 American Medical Association. All rights reserved. ?The codes documented in this report are preliminary and upon coder review may   ?be revised to meet current compliance requirements. ?Lucilla Lame MD, MD ?09/13/2021 11:38:27 AM ?This report has been signed electronically. ?Number of Addenda: 0 ?Note Initiated On: 09/13/2021 10:56 AM ?Estimated Blood Loss:  Estimated blood loss: none. ?     [facility] ?

## 2021-09-13 NOTE — Anesthesia Postprocedure Evaluation (Signed)
Anesthesia Post Note ? ?Patient: [place] Tate-Dimick ? ?Procedure(s) Performed: ENDOSCOPIC RETROGRADE CHOLANGIOPANCREATOGRAPHY (ERCP) ? ?Patient location during evaluation: PACU ?Anesthesia Type: General ?Level of consciousness: awake and alert ?Pain management: pain level controlled ?Vital Signs Assessment: post-procedure vital signs reviewed and stable ?Respiratory status: spontaneous breathing, nonlabored ventilation and respiratory function stable ?Cardiovascular status: blood pressure returned to baseline and stable ?Postop Assessment: no apparent nausea or vomiting ?Anesthetic complications: no ? ? ?No notable events documented. ? ? ?Last Vitals:  ?Vitals:  ? 09/13/21 1149 09/13/21 1159  ?BP: 130/67 132/78  ?Pulse: 62 65  ?Resp: 20 20  ?Temp:    ?SpO2: 93% 96%  ?  ?Last Pain:  ?Vitals:  ? 09/13/21 1159  ?TempSrc:   ?PainSc: 0-No pain  ? ? ?  ?  ?  ?  ?  ?  ? ?[place] Ouch ? ? ? ? ?

## 2021-09-13 NOTE — H&P (Signed)
? ?Brenda [name] [facility] ?[phone] ?[place], [place] [address] ?Phone:[phone] ?Fax : [phone] ? ?Primary Care Physician:  Brenda Limerick, [name] ?Primary Gastroenterologist:  Dr. Servando [name] ? ?Pre-Procedure History & Physical: ?HPI:  Brenda [name] is a 41 y.o. female is here for an ERCP. ?  ?Past Medical History:  ?Diagnosis Date  ? ADHD, adult residual type   ? Anxiety   ? Depression   ? GERD (gastroesophageal reflux disease)   ? Hypertension   ? Wears contact lenses   ? ? ?Past Surgical History:  ?Procedure Laterality Date  ? BREAST REDUCTION SURGERY    ? CHOLECYSTECTOMY    ? COLONOSCOPY WITH PROPOFOL N/A 11/09/2016  ? Procedure: COLONOSCOPY WITH PROPOFOL;  Surgeon: Brenda [name];  Location: [facility];  Service: Endoscopy;  Laterality: N/A;  ? ERCP N/A 08/01/2021  ? Procedure: ENDOSCOPIC RETROGRADE CHOLANGIOPANCREATOGRAPHY (ERCP);  Surgeon: Brenda [name];  Location: [facility] ENDOSCOPY;  Service: Endoscopy;  Laterality: N/A;  ? ESOPHAGOGASTRODUODENOSCOPY (EGD) WITH PROPOFOL N/A 11/09/2016  ? Procedure: ESOPHAGOGASTRODUODENOSCOPY (EGD) WITH PROPOFOL;  Surgeon: Brenda [name];  Location: [facility];  Service: Endoscopy;  Laterality: N/A;  ? TONSILLECTOMY    ? ? ?Prior to Admission medications   ?Medication Sig Start Date End Date Taking? Authorizing Provider  ?ALPRAZolam (XANAX) 1 MG tablet Take 1 mg by mouth 3 (three) times daily as needed for anxiety.   Yes Provider, Historical, [name]  ?amphetamine-dextroamphetamine (ADDERALL) 20 MG tablet Take 20 mg by mouth 2 (two) times daily.   Yes Provider, Historical, [name]  ?labetalol (NORMODYNE) 200 MG tablet Take 2 tablets (400 mg total) by mouth 2 (two) times daily. 04/28/21  Yes Vena Austria, [name]  ?Lurasidone HCl 60 MG TABS Take 60 mg by mouth daily.   Yes Provider, Historical, [name]  ?polyethylene glycol (MIRALAX / GLYCOLAX) 17 g packet Take 17 g by mouth daily as needed for mild constipation. 08/03/21  Yes Sreenath, Sudheer B, [name]   ?traZODone (DESYREL) 100 MG tablet Take 200 mg by mouth at bedtime. 07/17/20  Yes Provider, Historical, [name]  ?oxyCODONE (OXY IR/ROXICODONE) 5 MG immediate release tablet Take 1 tablet (5 mg total) by mouth every 4 (four) hours as needed for moderate pain. ?Patient not taking: Reported on 09/13/2021 08/03/21   Tresa Moore, [name]  ?pantoprazole (PROTONIX) 40 MG tablet TAKE 1 TABLET BY MOUTH EVERY DAY ?Patient not taking: Reported on 09/13/2021 02/18/21   Mirna Mires, CNM  ? ? ?Allergies as of 08/03/2021 - Review Complete 08/02/2021  ?Allergen Reaction Noted  ? Ace inhibitors Swelling 12/17/2015  ? ? ?Family History  ?Problem Relation Age of Onset  ? Hypertension Mother   ? Hypertension Father   ? Anxiety disorder Sister   ? Bipolar disorder Sister   ? Anxiety disorder Sister   ? Heart disease Maternal Grandfather   ? Heart disease Maternal Grandmother   ? Diabetes Paternal Grandfather   ? ? ?Social History  ? ?Socioeconomic History  ? Marital status: Married  ?  Spouse name: Melvyn Neth  ? Number of children: 2  ? Years of education: Not on file  ? Highest education level: Not on file  ?Occupational History  ? Occupation: Teacher, adult education   ?Tobacco Use  ? Smoking status: Never  ? Smokeless tobacco: Never  ?Vaping Use  ? Vaping Use: Never used  ?Substance and Sexual Activity  ? Alcohol use: Not Currently  ?  Alcohol/week: 10.0 standard drinks  ?  Types: 5 Glasses of wine,  5 Cans of beer per week  ?  Comment: not anymore  ? Drug use: No  ? Sexual activity: Yes  ?  Birth control/protection: None  ?Other Topics Concern  ? Not on file  ?Social History Narrative  ? Not on file  ? ?Social Determinants of Health  ? ?Financial Resource Strain: Not on file  ?Food Insecurity: Not on file  ?Transportation Needs: Not on file  ?Physical Activity: Not on file  ?Stress: Not on file  ?Social Connections: Not on file  ?Intimate Partner Violence: Not on file  ? ? ?Review of Systems: ?See HPI, otherwise negative ROS ? ?Physical Exam: ?BP 123/73    Pulse (!) 54   Temp (!) 97.4 ?F (36.3 ?C) (Temporal)   Resp 18   Ht 5\' 4"  (1.626 m)   Wt 101.4 kg   LMP 09/03/2021   SpO2 99%   BMI 38.36 kg/m?  ?General:   Alert,  pleasant and cooperative in NAD ?Head:  Normocephalic and atraumatic. ?Neck:  Supple; no masses or thyromegaly. ?Lungs:  Clear throughout to auscultation.    ?Heart:  Regular rate and rhythm. ?Abdomen:  Soft, nontender and nondistended. Normal bowel sounds, without guarding, and without rebound.   ?Neurologic:  Alert and  oriented x4;  grossly normal neurologically. ? ?Impression/Plan: ?11/03/2021 [name] is here for an ERCP to be performed for stent removal ? ?Risks, benefits, limitations, and alternatives regarding  ERCP have been reviewed with the patient.  Questions have been answered.  All parties agreeable. ? ? ?[place], [name]  09/13/2021, 11:06 AM ?

## 2021-09-13 NOTE — Anesthesia Preprocedure Evaluation (Addendum)
Anesthesia Evaluation  ?Patient identified by MRN, date of birth, ID band ?Patient awake ? ? ? ?Reviewed: ?Allergy & Precautions, NPO status , Patient's Chart, lab work & pertinent test results ? ?History of Anesthesia Complications ?Negative for: history of anesthetic complications ? ?Airway ?Mallampati: III ? ? ?Neck ROM: Full ? ? ? Dental ? ?(+) Dental Advidsory Given, Teeth Intact ?  ?Pulmonary ?neg pulmonary ROS,  ?  ?Pulmonary exam normal ? ? ? ? ? ? ? Cardiovascular ?Exercise Tolerance: Good ?hypertension, Pt. on medications ?(-) angina(-) Past MI (-) dysrhythmias (-) Valvular Problems/Murmurs ?Rhythm:Regular Rate:Normal ? ? ?  ?Neuro/Psych ?PSYCHIATRIC DISORDERS Anxiety Depression Current headache ?negative neurological ROS ?   ? GI/Hepatic ?Neg liver ROS, GERD  Controlled,S/p cholecystectomy ?  ?Endo/Other  ?negative endocrine ROSneg diabetes ? Renal/GU ?negative Renal ROS  ?negative genitourinary ?  ?Musculoskeletal ?negative musculoskeletal ROS ?(+)  ? Abdominal ?(+) + obese,   ?Peds ? Hematology ?  ?Anesthesia Other Findings ?Past Medical History: ?No date: ADHD, adult residual type ?No date: Anxiety ?No date: Depression ?No date: GERD (gastroesophageal reflux disease) ?No date: Hypertension ?No date: Wears contact lenses ? ? Reproductive/Obstetrics ?negative OB ROS ?IOL for chronic HTN ? ?  ? ? ? ? ? ? ? ? ? ? ? ? ? ?  ?  ? ? ? ? ? ? ? ?Anesthesia Physical ? ?Anesthesia Plan ? ?ASA: 3 ? ?Anesthesia Plan: General  ? ?Post-op Pain Management: Minimal or no pain anticipated  ? ?Induction: Intravenous ? ?PONV Risk Score and Plan: 3 and Treatment may vary due to age or medical condition, TIVA and Propofol infusion ? ?Airway Management Planned: Natural Airway and Simple Face Mask ? ?Additional Equipment:  ? ?Intra-op Plan:  ? ?Post-operative Plan:  ? ?Informed Consent: I have reviewed the patients History and Physical, chart, labs and discussed the procedure including the  risks, benefits and alternatives for the proposed anesthesia with the patient or authorized representative who has indicated his/her understanding and acceptance.  ? ? ? ?Dental advisory given ? ?Plan Discussed with: CRNA and Anesthesiologist ? ?Anesthesia Plan Comments: ( ? ?)  ? ? ? ? ?Anesthesia Quick Evaluation ? ?

## 2021-09-13 NOTE — Transfer of Care (Signed)
Immediate Anesthesia Transfer of Care Note ? ?Patient: Brenda [name] ? ?Procedure(s) Performed: ENDOSCOPIC RETROGRADE CHOLANGIOPANCREATOGRAPHY (ERCP) ? ?Patient Location: PACU ? ?Anesthesia Type:General ? ?Level of Consciousness: awake ? ?Airway & Oxygen Therapy: Patient Spontanous Breathing ? ?Post-op Assessment: Report given to RN ? ?Post vital signs: stable ? ?Last Vitals:  ?Vitals Value Taken Time  ?BP    ?Temp    ?Pulse    ?Resp    ?SpO2    ? ? ?Last Pain:  ?Vitals:  ? 09/13/21 1029  ?TempSrc: Temporal  ?PainSc: 0-No pain  ?   ? ?  ? ?Complications: No notable events documented. ?

## 2021-09-14 ENCOUNTER — Encounter: Payer: Self-pay | Admitting: Gastroenterology

## 2022-01-31 DIAGNOSIS — F900 Attention-deficit hyperactivity disorder, predominantly inattentive type: Secondary | ICD-10-CM | POA: Diagnosis not present

## 2022-01-31 DIAGNOSIS — F3174 Bipolar disorder, in full remission, most recent episode manic: Secondary | ICD-10-CM | POA: Diagnosis not present

## 2022-01-31 DIAGNOSIS — F3176 Bipolar disorder, in full remission, most recent episode depressed: Secondary | ICD-10-CM | POA: Diagnosis not present

## 2022-05-01 ENCOUNTER — Telehealth: Payer: Self-pay

## 2022-05-01 NOTE — Telephone Encounter (Signed)
Received a fax from pharmacy for a prescription refill for Labetalol HCL 200 MG tablet.   Request was declined due to she needs an appointment as well Dr. Georgianne Fick is no longer at this practice.

## 2022-05-05 ENCOUNTER — Encounter: Payer: Self-pay | Admitting: Family Medicine

## 2022-05-05 ENCOUNTER — Ambulatory Visit (INDEPENDENT_AMBULATORY_CARE_PROVIDER_SITE_OTHER): Payer: BC Managed Care – PPO | Admitting: Family Medicine

## 2022-05-05 VITALS — BP 130/96 | HR 86 | Ht 64.0 in | Wt 219.0 lb

## 2022-05-05 DIAGNOSIS — J0100 Acute maxillary sinusitis, unspecified: Secondary | ICD-10-CM

## 2022-05-05 DIAGNOSIS — I10 Essential (primary) hypertension: Secondary | ICD-10-CM | POA: Diagnosis not present

## 2022-05-05 DIAGNOSIS — K219 Gastro-esophageal reflux disease without esophagitis: Secondary | ICD-10-CM

## 2022-05-05 DIAGNOSIS — J4521 Mild intermittent asthma with (acute) exacerbation: Secondary | ICD-10-CM

## 2022-05-05 MED ORDER — MONTELUKAST SODIUM 10 MG PO TABS: 10.0000 mg | ORAL_TABLET | Freq: Every day | ORAL | 3 refills | Status: DC

## 2022-05-05 MED ORDER — HYDROCHLOROTHIAZIDE 12.5 MG PO TABS: 12.5000 mg | ORAL_TABLET | Freq: Every day | ORAL | 3 refills | Status: DC

## 2022-05-05 MED ORDER — BENZONATATE 100 MG PO CAPS: 100.0000 mg | ORAL_CAPSULE | Freq: Three times a day (TID) | ORAL | 0 refills | Status: DC | PRN

## 2022-05-05 MED ORDER — AMOXICILLIN 500 MG PO CAPS: 500.0000 mg | ORAL_CAPSULE | Freq: Three times a day (TID) | ORAL | 0 refills | Status: AC

## 2022-05-05 MED ORDER — PANTOPRAZOLE SODIUM 40 MG PO TBEC: DELAYED_RELEASE_TABLET | ORAL | 1 refills | Status: DC

## 2022-05-05 NOTE — Progress Notes (Signed)
Date:  05/05/2022   Name:  Brenda [name]   DOB:  1981/06/26   MRN:  [id number]   Chief Complaint: Gastroesophageal Reflux, Hypertension, and Sinusitis  Gastroesophageal Reflux She complains of heartburn. She reports no chest pain, no coughing, no dysphagia, no nausea, no sore throat or no wheezing. This is a chronic problem. The problem has been gradually improving. The heartburn is of moderate intensity. The symptoms are aggravated by certain foods. Pertinent negatives include no anemia, fatigue, melena, muscle weakness, orthopnea or weight loss. She has tried a PPI for the symptoms. The treatment provided moderate relief.  Hypertension This is a chronic problem. The problem has been gradually improving since onset. The problem is controlled. Pertinent negatives include no blurred vision, chest pain, neck pain, palpitations, PND or shortness of breath. The current treatment provides moderate improvement. There are no compliance problems.   Sinusitis This is a chronic problem. The current episode started more than 1 year ago. The problem has been gradually improving since onset. There has been no fever. Associated symptoms include congestion and sinus pressure. Pertinent negatives include no coughing, ear pain, neck pain, shortness of breath or sore throat. The treatment provided moderate relief.    Lab Results  Component Value Date   NA 138 08/03/2021   K 3.9 08/03/2021   CO2 25 08/03/2021   GLUCOSE 127 (H) 08/03/2021   BUN 6 08/03/2021   CREATININE 0.68 08/03/2021   CALCIUM 8.7 (L) 08/03/2021   GFRNONAA >60 08/03/2021   No results found for: "CHOL", "HDL", "LDLCALC", "LDLDIRECT", "TRIG", "CHOLHDL" Lab Results  Component Value Date   TSH 1.640 08/05/2020   No results found for: "HGBA1C" Lab Results  Component Value Date   WBC 14.7 (H) 08/03/2021   HGB 12.0 08/03/2021   HCT 38.1 08/03/2021   MCV 84.7 08/03/2021   PLT 310 08/03/2021   Lab Results  Component Value  Date   ALT 112 (H) 08/03/2021   AST 37 08/03/2021   ALKPHOS 85 08/03/2021   BILITOT 0.3 08/03/2021   No results found for: "25OHVITD2", "25OHVITD3", "VD25OH"   Review of Systems  Constitutional:  Negative for fatigue and weight loss.  HENT:  Positive for congestion and sinus pressure. Negative for ear pain, sore throat and trouble swallowing.   Eyes:  Negative for blurred vision.  Respiratory:  Negative for cough, shortness of breath and wheezing.   Cardiovascular:  Negative for chest pain, palpitations and PND.  Gastrointestinal:  Positive for heartburn. Negative for constipation, diarrhea, dysphagia, melena and nausea.  Musculoskeletal:  Negative for muscle weakness and neck pain.    Patient Active Problem List   Diagnosis Date Noted   Fitting and adjustment of gastrointestinal appliance and device    Anxiety 07/31/2021   Hypokalemia 07/31/2021   Choledocholithiasis with obstruction 07/31/2021   Bipolar disorder in full remission (HCC) 07/18/2021   Panic attacks 07/18/2021   History of ADHD 07/18/2021   Significant use of alcohol 07/18/2021   Postpartum care following vaginal delivery 03/17/2021   Chronic benign essential hypertension, antepartum 03/15/2021   Pre-existing essential hypertension during pregnancy, antepartum 08/05/2020   Gastroesophageal reflux disease 07/10/2019   Essential hypertension 07/10/2019   Performance anxiety 07/10/2019   Epigastric abdominal pain 03/10/2018   Rectal polyp    Heartburn    Nausea    Adult ADHD 12/07/2014    Allergies  Allergen Reactions   Ace Inhibitors Swelling    Lips/ face    Past Surgical History:  Procedure Laterality  Date   BREAST REDUCTION SURGERY     CHOLECYSTECTOMY     COLONOSCOPY WITH PROPOFOL N/A 11/09/2016   Procedure: COLONOSCOPY WITH PROPOFOL;  Surgeon: [name] Minium, MD;  Location: [facility];  Service: Endoscopy;  Laterality: N/A;   ERCP N/A 08/01/2021   Procedure: ENDOSCOPIC RETROGRADE  CHOLANGIOPANCREATOGRAPHY (ERCP);  Surgeon: [name] Minium, MD;  Location: [facility] END[facility];  Service: Endoscopy;  Laterality: N/A;   ERCP N/A 09/13/2021   Procedure: ENDOSCOPIC RETROGRADE CHOLANGIOPANCREATOGRAPHY (ERCP);  Surgeon: [name] Minium, MD;  Location: [facility] ENDOSCOPY;  Service: Endoscopy;  Laterality: N/A;  Stent removal   ESOPHAGOGASTRODUODENOSCOPY (EGD) WITH PROPOFOL N/A 11/09/2016   Procedure: ESOPHAGOGASTRODUODENOSCOPY (EGD) WITH PROPOFOL;  Surgeon: [name] Minium, MD;  Location: [facility];  Service: Endoscopy;  Laterality: N/A;   TONSILLECTOMY      Social History   Tobacco Use   Smoking status: Never   Smokeless tobacco: Never  Vaping Use   Vaping Use: Never used  Substance Use Topics   Alcohol use: Not Currently    Alcohol/week: 10.0 standard drinks of alcohol    Types: 5 Glasses of wine, 5 Cans of beer per week    Comment: not anymore   Drug use: No     Medication list has been reviewed and updated.  Current Meds  Medication Sig   ALPRAZolam (XANAX) 1 MG tablet Take 1 mg by mouth 3 (three) times daily as needed for anxiety.   amphetamine-dextroamphetamine (ADDERALL) 20 MG tablet Take 20 mg by mouth 2 (two) times daily.   Lurasidone HCl 60 MG TABS Take 60 mg by mouth daily.   pantoprazole (PROTONIX) 40 MG tablet TAKE 1 TABLET BY MOUTH EVERY DAY   polyethylene glycol (MIRALAX / GLYCOLAX) 17 g packet Take 17 g by mouth daily as needed for mild constipation.   traZODone (DESYREL) 100 MG tablet Take 200 mg by mouth at bedtime.       05/05/2022    4:26 PM 07/18/2021    4:54 PM 07/18/2021    2:00 PM 12/25/2019    1:41 PM  GAD 7 : Generalized Anxiety Score  Nervous, Anxious, on Edge 0   0  Control/stop worrying 0   0  Worry too much - different things 0   0  Trouble relaxing 0   0  Restless 0   0  Easily annoyed or irritable 0   0  Afraid - awful might happen 0   0  Total GAD 7 Score 0   0  Anxiety Difficulty Not difficult at all        Information is  confidential and restricted. Go to Review Flowsheets to unlock data.       05/05/2022    4:26 PM 07/18/2021    4:53 PM 07/18/2021    1:59 PM  Depression screen PHQ 2/9  Decreased Interest 0    Down, Depressed, Hopeless 0    PHQ - 2 Score 0    Altered sleeping 0    Tired, decreased energy 0    Change in appetite 0    Feeling bad or failure about yourself  0    Trouble concentrating 0    Moving slowly or fidgety/restless 0    Suicidal thoughts 0    PHQ-9 Score 0    Difficult doing work/chores Not difficult at all       Information is confidential and restricted. Go to Review Flowsheets to unlock data.    BP Readings from Last 3 Encounters:  05/05/22 Marland Kitchen)  130/96  09/13/21 132/78  08/03/21 (!) 151/84    Physical Exam Vitals and nursing note reviewed. Exam conducted with a chaperone present.  Constitutional:      General: She is not in acute distress.    Appearance: She is not diaphoretic.  HENT:     Head: Normocephalic and atraumatic.     Right Ear: Tympanic membrane and external ear normal.     Left Ear: Tympanic membrane and external ear normal.     Nose: Congestion present.     Mouth/Throat:     Mouth: Mucous membranes are moist.  Eyes:     Conjunctiva/sclera: Conjunctivae normal.     Pupils: Pupils are equal, round, and reactive to light.  Neck:     Thyroid: No thyromegaly.     Vascular: No JVD.  Cardiovascular:     Rate and Rhythm: Normal rate and regular rhythm.     Heart sounds: Normal heart sounds. No murmur heard.    No friction rub. No gallop.  Pulmonary:     Effort: Pulmonary effort is normal.     Breath sounds: Normal breath sounds. No wheezing, rhonchi or rales.  Abdominal:     General: Bowel sounds are normal.     Palpations: Abdomen is soft. There is no mass.     Tenderness: There is no abdominal tenderness. There is no guarding or rebound.  Musculoskeletal:        General: Normal range of motion.     Cervical back: Neck supple.  Lymphadenopathy:      Cervical: No cervical adenopathy.  Skin:    General: Skin is warm and dry.  Neurological:     Mental Status: She is alert.     Wt Readings from Last 3 Encounters:  05/05/22 219 lb (99.3 kg)  09/13/21 223 lb 7.3 oz (101.4 kg)  08/01/21 235 lb 14.3 oz (107 kg)    BP (!) 130/96 (BP Location: Right Arm, Cuff Size: Large)   Pulse 86   Ht 5\' 4"  (1.626 m)   Wt 219 lb (99.3 kg)   LMP 04/20/2022   SpO2 96%   Breastfeeding No   BMI 37.59 kg/m   Assessment and Plan:  1. Gastroesophageal reflux disease, unspecified whether esophagitis present Chronic.  Controlled.  Stable.  Not controlled on over-the-counter PPI we will resume pantoprazole 40 mg once a day. - pantoprazole (PROTONIX) 40 MG tablet; TAKE 1 TABLET BY MOUTH EVERY DAY  Dispense: 90 tablet; Refill: 1  2. Essential hypertension Patient has not taken medication but will 1 time today.  And has been episodically taking it prior and that she has been saving 1 pill.  This was labetalol and since it was initiated when patient was pregnant and is no longer intending to be pregnant we will discontinue this and resume previous medication hydrochlorothiazide 12.5 mg once a day. - hydrochlorothiazide (HYDRODIURIL) 12.5 MG tablet; Take 1 tablet (12.5 mg total) by mouth daily.  Dispense: 90 tablet; Refill: 3  3. Acute maxillary sinusitis, recurrence not specified New onset.  Persistent.  Stable.  We will initiate amoxicillin 500 mg 3 times a day for 10 days. - amoxicillin (AMOXIL) 500 MG capsule; Take 1 capsule (500 mg total) by mouth 3 (three) times daily for 10 days.  Dispense: 30 capsule; Refill: 0  4. Mild intermittent reactive airway disease with acute exacerbation Patient's had somewhat of a cough and we will resume Singulair 10 mg once a day as well as Tessalon Perles for cough suppression. -  montelukast (SINGULAIR) 10 MG tablet; Take 1 tablet (10 mg total) by mouth at bedtime.  Dispense: 30 tablet; Refill: 3 - benzonatate  (TESSALON PERLES) 100 MG capsule; Take 1 capsule (100 mg total) by mouth 3 (three) times daily as needed for cough.  Dispense: 20 capsule; Refill: 0    Elizabeth Sauer, MD

## 2022-05-05 NOTE — Patient Instructions (Signed)

## 2022-05-23 DIAGNOSIS — F39 Unspecified mood [affective] disorder: Secondary | ICD-10-CM | POA: Diagnosis not present

## 2022-05-23 DIAGNOSIS — Z0000 Encounter for general adult medical examination without abnormal findings: Secondary | ICD-10-CM | POA: Diagnosis not present

## 2022-05-23 DIAGNOSIS — Z23 Encounter for immunization: Secondary | ICD-10-CM | POA: Diagnosis not present

## 2022-05-23 DIAGNOSIS — R5382 Chronic fatigue, unspecified: Secondary | ICD-10-CM | POA: Diagnosis not present

## 2022-05-23 DIAGNOSIS — I10 Essential (primary) hypertension: Secondary | ICD-10-CM | POA: Diagnosis not present

## 2022-06-25 ENCOUNTER — Other Ambulatory Visit: Payer: Self-pay | Admitting: Family Medicine

## 2022-06-25 DIAGNOSIS — J4521 Mild intermittent asthma with (acute) exacerbation: Secondary | ICD-10-CM

## 2022-10-29 ENCOUNTER — Other Ambulatory Visit: Payer: Self-pay | Admitting: Family Medicine

## 2022-10-29 DIAGNOSIS — K219 Gastro-esophageal reflux disease without esophagitis: Secondary | ICD-10-CM

## 2022-10-30 NOTE — Telephone Encounter (Signed)
Requested Prescriptions  Pending Prescriptions Disp Refills   pantoprazole (PROTONIX) 40 MG tablet [Pharmacy Med Name: PANTOPRAZOLE SOD DR 40 MG TAB] 90 tablet 2    Sig: TAKE 1 TABLET BY MOUTH EVERY DAY     Gastroenterology: Proton Pump Inhibitors Passed - 10/29/2022  9:44 AM      Passed - Valid encounter within last 12 months    Recent Outpatient Visits           5 months ago Essential hypertension   [place] [facility]   2 years ago Acute low back pain without sciatica, unspecified back pain laterality   [facility]   3 years ago Essential hypertension   [place] [facility]   3 years ago Essential hypertension   [place] [facility]   3 years ago Essential hypertension   [place] [facility]

## 2023-01-26 ENCOUNTER — Other Ambulatory Visit: Payer: Self-pay | Admitting: Family Medicine

## 2023-01-26 DIAGNOSIS — J4521 Mild intermittent asthma with (acute) exacerbation: Secondary | ICD-10-CM

## 2023-01-26 NOTE — Telephone Encounter (Signed)
Requested Prescriptions  Pending Prescriptions Disp Refills   montelukast (SINGULAIR) 10 MG tablet [Pharmacy Med Name: MONTELUKAST SOD 10 MG TABLET] 90 tablet 0    Sig: TAKE 1 TABLET BY MOUTH EVERYDAY AT BEDTIME     Pulmonology:  Leukotriene Inhibitors Passed - 01/26/2023  2:46 AM      Passed - Valid encounter within last 12 months    Recent Outpatient Visits           8 months ago Essential hypertension   [place] [facility]   3 years ago Acute low back pain without sciatica, unspecified back pain laterality   [facility]   3 years ago Essential hypertension   [place] [facility]   3 years ago Essential hypertension   [place] [facility]   4 years ago Essential hypertension   [place] [facility]

## 2023-03-15 ENCOUNTER — Ambulatory Visit
Admission: RE | Admit: 2023-03-15 | Discharge: 2023-03-15 | Disposition: A | Discharge status: Home / Self Care | Payer: BC Managed Care – PPO | Source: Ambulatory Visit

## 2023-03-15 VITALS — BP 140/93 | HR 54 | Temp 98.9°F | Resp 16 | Ht 64.0 in | Wt 178.0 lb

## 2023-03-15 DIAGNOSIS — M25512 Pain in left shoulder: Secondary | ICD-10-CM | POA: Diagnosis not present

## 2023-03-15 DIAGNOSIS — M5440 Lumbago with sciatica, unspecified side: Secondary | ICD-10-CM | POA: Diagnosis not present

## 2023-03-15 MED ORDER — PREDNISONE 50 MG PO TABS: 50.0000 mg | ORAL_TABLET | Freq: Every day | ORAL | 0 refills | Status: AC

## 2023-03-15 NOTE — ED Provider Notes (Signed)
[facility]    CSN: [id number] Arrival date & time: 03/15/23  0947      History   Chief Complaint Chief Complaint  Patient presents with   Back Pain    Appt     HPI Brenda [name] is a 42 y.o. female.   Patient is a 42 year old female who presents with chief complaint of lower back pain with radiation down her legs as well as left shoulder pain.  Patient reports her pain is along right lower back just above the buttocks.  She states pain radiates down through her groin and down her legs to the foot.  She reports shooting pain would only be in her calf.  States pain is worse with bending over especially with bending over while holding something.  States pain started day 4 yesterday.  Patient also reports pain to her left shoulder that started last night.  She does states she typically carries her 39-year-old son on the left side and that he weighs about 32 pounds.  She reports that she is able to sit still does improve also improve when she is laying on her back with her legs elevated.  She reports she used a TENS unit at home which seemed to help.  Patient denies any injuries or falls patient does report some dizziness which she attributes to decreased oral intake due to nausea from taking Zepbound injections for weight loss.  She states she is lost 43 pounds without medication.  She also states she has recently started doing some running and interval training.  She states those may have increased her causes her pain as well.    Past Medical History:  Diagnosis Date   ADHD, adult residual type    Anxiety    Depression    GERD (gastroesophageal reflux disease)    Hypertension    Wears contact lenses     Patient Active Problem List   Diagnosis Date Noted   Fitting and adjustment of gastrointestinal appliance and device    Anxiety 07/31/2021   Hypokalemia 07/31/2021   Choledocholithiasis with obstruction 07/31/2021   Bipolar disorder in full remission (HCC)  07/18/2021   Panic attacks 07/18/2021   History of ADHD 07/18/2021   Significant use of alcohol 07/18/2021   Postpartum care following vaginal delivery 03/17/2021   Chronic benign essential hypertension, antepartum 03/15/2021   Pre-existing essential hypertension during pregnancy, antepartum 08/05/2020   Gastroesophageal reflux disease 07/10/2019   Essential hypertension 07/10/2019   Performance anxiety 07/10/2019   Epigastric abdominal pain 03/10/2018   Rectal polyp    Heartburn    Nausea    Adult ADHD 12/07/2014    Past Surgical History:  Procedure Laterality Date   BREAST REDUCTION SURGERY     CHOLECYSTECTOMY     COLONOSCOPY WITH PROPOFOL N/A 11/09/2016   Procedure: COLONOSCOPY WITH PROPOFOL;  Surgeon: [name] Minium, MD;  Location[facility];  Service: Endoscopy;  Laterality: N/A;   ERCP N/A 08/01/2021   Procedure: ENDOSCOPIC RETROGRADE CHOLANGIOPANCREATOGRAPHY (ERCP);  Surgeon: [name] Minium, MD;  Location: [facility] END[facility];  Service: Endoscopy;  Laterality: N/A;   ERCP N/A 09/13/2021   Procedure: ENDOSCOPIC RETROGRADE CHOLANGIOPANCREATOGRAPHY (ERCP);  Surgeon: [name] Minium, MD;  Location: [facility] END[facility];  Service: Endoscopy;  Laterality: N/A;  Stent removal   ESOPHAGOGASTRODUODENOSCOPY (EGD) WITH PROPOFOL N/A 11/09/2016   Procedure: ESOPHAGOGASTRODUODENOSCOPY (EGD) WITH PROPOFOL;  Surgeon: [name] Minium, MD;  Location: [facility];  Service: Endoscopy;  Laterality: N/A;   TONSILLECTOMY      OB History  Gravida  3   Para  2   Term  2   Preterm      AB  1   Living  2      SAB      IAB      Ectopic      Multiple  0   Live Births  2            Home Medications    Prior to Admission medications   Medication Sig Start Date End Date Taking? Authorizing Provider  ALPRAZolam Prudy Feeler) 1 MG tablet Take 1 mg by mouth 3 (three) times daily as needed for anxiety.   Yes [provider]  amphetamine-dextroamphetamine (ADDERALL) 20 MG  tablet Take 20 mg by mouth 2 (two) times daily.   Yes [provider]  CAPLYTA 42 MG capsule Take 42 mg by mouth at bedtime.   Yes [provider]  colestipol (COLESTID) 1 g tablet Take 1 g by mouth 2 (two) times daily.   Yes [provider]  dexlansoprazole (DEXILANT) 60 MG capsule Take 1 capsule by mouth daily. 01/05/23 01/05/24 Yes [provider]  labetalol (NORMODYNE) 200 MG tablet Take 400 mg by mouth 2 (two) times daily.   Yes [provider]  montelukast (SINGULAIR) 10 MG tablet TAKE 1 TABLET BY MOUTH EVERYDAY AT BEDTIME 01/26/23  Yes Duanne Limerick, MD  pantoprazole (PROTONIX) 40 MG tablet TAKE 1 TABLET BY MOUTH EVERY DAY 10/30/22  Yes Duanne Limerick, MD  predniSONE (DELTASONE) 50 MG tablet Take 1 tablet (50 mg total) by mouth daily for 5 days. 03/15/23 03/20/23 Yes Candis Schatz, PA-C  sucralfate (CARAFATE) 1 g tablet Take 1 tablet by mouth 4 (four) times daily. 03/02/23  Yes [provider]  tiZANidine (ZANAFLEX) 2 MG tablet Take 2 mg by mouth every 8 (eight) hours as needed.   Yes [provider]  traZODone (DESYREL) 100 MG tablet Take 200 mg by mouth at bedtime. 07/17/20  Yes [provider]  ZEPBOUND 10 MG/0.5ML Pen SMARTSIG:10 Milligram(s) SUB-Q Once a Week   Yes [provider]  benzonatate (TESSALON PERLES) 100 MG capsule Take 1 capsule (100 mg total) by mouth 3 (three) times daily as needed for cough. 05/05/22   Duanne Limerick, MD  hydrochlorothiazide (HYDRODIURIL) 12.5 MG tablet Take 1 tablet (12.5 mg total) by mouth daily. 05/05/22   Duanne Limerick, MD  Lurasidone HCl 60 MG TABS Take 60 mg by mouth daily.    [provider]  polyethylene glycol (MIRALAX / GLYCOLAX) 17 g packet Take 17 g by mouth daily as needed for mild constipation. 08/03/21   Tresa Moore, MD    Family History Family History  Problem Relation Age of Onset   Hypertension Mother    Hypertension Father    Anxiety  disorder Sister    Bipolar disorder Sister    Anxiety disorder Sister    Heart disease Maternal Grandfather    Heart disease Maternal Grandmother    Diabetes Paternal Grandfather     Social History Social History   Tobacco Use   Smoking status: Never   Smokeless tobacco: Never  Vaping Use   Vaping status: Never Used  Substance Use Topics   Alcohol use: Not Currently    Alcohol/week: 10.0 standard drinks of alcohol    Types: 5 Glasses of wine, 5 Cans of beer per week    Comment: not anymore   Drug use: No  Allergies   Ace inhibitors   Review of Systems Review of Systems as noted above in HPI.  Other systems reviewed and found to be negative   Physical Exam Triage Vital Signs ED Triage Vitals  Encounter Vitals Group     BP 03/15/23 0953 (!) 140/93     Systolic BP Percentile --      Diastolic BP Percentile --      Pulse Rate 03/15/23 0953 (!) 54     Resp 03/15/23 0953 16     Temp 03/15/23 0953 98.9 F (37.2 C)     Temp Source 03/15/23 0953 Oral     SpO2 03/15/23 0953 98 %     Weight 03/15/23 0950 178 lb (80.7 kg)     Height 03/15/23 0950 5\' 4"  (1.626 m)     Head Circumference --      Peak Flow --      Pain Score 03/15/23 0956 6     Pain Loc --      Pain Education --      Exclude from Growth Chart --    No data found.  Updated Vital Signs BP (!) 140/93 (BP Location: Left Arm)   Pulse (!) 54   Temp 98.9 F (37.2 C) (Oral)   Resp 16   Ht 5\' 4"  (1.626 m)   Wt 178 lb (80.7 kg)   SpO2 98%   BMI 30.55 kg/m   Visual Acuity Right Eye Distance:   Left Eye Distance:   Bilateral Distance:    Right Eye Near:   Left Eye Near:    Bilateral Near:     Physical Exam Constitutional:      Appearance: Normal appearance.  Pulmonary:     Effort: Pulmonary effort is normal.  Musculoskeletal:     Right shoulder: Normal.       Arms:     Cervical back: Normal.     Thoracic back: Normal.       Back:  Neurological:     General: No focal deficit present.      Mental Status: She is alert.      UC Treatments / Results  Labs (all labs ordered are listed, but only abnormal results are displayed) Labs Reviewed - No data to display  EKG   Radiology No results found.  Procedures Procedures (including critical care time)  Medications Ordered in UC Medications - No data to display  Initial Impression / Assessment and Plan / UC Course  I have reviewed the triage vital signs and the nursing notes.  Pertinent labs & imaging results that were available during my care of the patient were reviewed by me and considered in my medical decision making (see chart for details).     Patient presents with right lower back upper buttock pain that is worse with palpation with pain rating down her legs.  Patient also reporting some left shoulder pain.  Left shoulder pain most likely related to caring her 39-year-old son who is getting heavier.  Patient has recently started interval training and running to help with weight loss.  Patient reported nausea and dizziness likely related to her use of Zepbound and not keeping up with oral intake. -Tenderness to palpation of the right iliac area, worse with bending over.  Will give her prescription for a short course of steroids to help with inflammation and give her back exercise information.  Have her follow-up primary care if no improvement.  Final Clinical Impressions(s) / UC Diagnoses  Final diagnoses:  Acute left-sided low back pain with sciatica, sciatica laterality unspecified  Acute pain of left shoulder     Discharge Instructions      -Prednisone: Take 1 tablet daily for 5 days -Avoid aggravating movements as possible. -Can use heating pad for symptoms as well. -Can use over-the-counter Tylenol for pain -Follow with primary care if no improvement.     ED Prescriptions     Medication Sig Dispense Auth. Provider   predniSONE (DELTASONE) 50 MG tablet Take 1 tablet (50 mg total) by mouth  daily for 5 days. 5 tablet Candis Schatz, PA-C      PDMP not reviewed this encounter.   Candis Schatz, PA-C 03/15/23 1042

## 2023-03-15 NOTE — Discharge Instructions (Addendum)
-  Prednisone: Take 1 tablet daily for 5 days -Avoid aggravating movements as possible. -Can use heating pad for symptoms as well. -Can use over-the-counter Tylenol for pain -Follow with primary care if no improvement.

## 2023-03-15 NOTE — ED Triage Notes (Signed)
Pt c/o back pain & in R leg down to bottom of foot & upper R shoulder x2 days. Denies any falls or injuries.

## 2023-04-26 ENCOUNTER — Other Ambulatory Visit: Payer: Self-pay | Admitting: Family Medicine

## 2023-04-26 DIAGNOSIS — J4521 Mild intermittent asthma with (acute) exacerbation: Secondary | ICD-10-CM

## 2023-04-26 DIAGNOSIS — I10 Essential (primary) hypertension: Secondary | ICD-10-CM

## 2023-05-07 ENCOUNTER — Ambulatory Visit: Payer: BC Managed Care – PPO | Admitting: Student

## 2023-05-07 NOTE — Progress Notes (Unsigned)
Cardiology Clinic Note   Date: 05/10/2023 ID: Brenda [name], DOB March 07, 1981, MRN [id number]  Primary Cardiologist:  Debbe Odea, MD  Patient Profile    Brenda [name] is a 42 y.o. female who presents to the clinic today for routine follow up.     Past medical history significant for: Hypertension. Syncope. 2-week ZIO 09/02/2020: Predominant underlying rhythm was sinus rhythm.  First-degree AV block present.  HR 30 to 210 bpm, average 63 bpm.  1 run of NSVT lasting 7 beats.  10 pauses longest 11.7 seconds. Echo 09/10/2020: EF 60 to 65%.  No RWMA.  Normal diastolic parameters.  Normal RV function.  Mild LAE.  Mild MR. Cardiac MRI 04/06/2021: Normal LV/RV size and function.  No late gadolinium enhancement in the right or left ventricular myocardium.  No LV/RV wall motion abnormalities.  No evidence for ARVC or other cardiomyopathy.  Normal cardiac MRI. GERD.  ADHD.   In summary, patient was first seen by Dr. Azucena Cecil 08/10/2020 for syncope at the request of Dr. Bonney Aid.  Patient was [redacted] weeks pregnant at that time and had 2 syncopal events related to position changes from lying down to standing up.  She was evaluated in the ER prior to her visit and orthostatic vitals were performed with no evidence of orthostasis.  14-day ZIO showed 1 run of NSVT lasting 7 beats at 10 pauses, longest 11.7 seconds.  She was referred to EP.  Dr. Graciela Husbands reviewed event monitor with patient and she noted on the day she had the pauses she was particularly sick with morning sickness/vomiting.  She underwent echo which showed normal LV/RV function (details above).  She underwent cardiac MRI October 2022 which was normal.     History of Present Illness    Brenda [name] is followed by Dr. Azucena Cecil for the above outlined history.  Patient was last seen in the office by Dr. Azucena Cecil on 07/11/2021 for routine follow-up.  She was doing well at that time and had no further episodes of syncope  or dizziness postpartum.  No medication changes were made.  Discussed the use of AI scribe software for clinical note transcription with the patient, who gave verbal consent to proceed.  The patient presents for routine follow-up. She reports weight loss of 50 lb on Zepbound. She has been having episodes of dizziness particularly when she stands up quickly and is carrying her toddler. When she has positional dizziness she will feel winded. This resolves on its own in a short period of time. She reports recently running sprints with her daughter with no chest pain or unusual dyspnea. She recently saw her PCP who felt her HR has gotten too low and wants her to stop Labetalol and start Losartan along with the HCTZ.  She has not done this yet as she wanted to have this appointment first.  The patient also mentions irregular eating habits, often forgetting to eat due to a lack of hunger and busy work schedule. She is also on ADD medication which suppresses her appetite as well. She denies lower extremity edema. She reports occasional palpitations described as skipped beats that are very brief. She recalls when she stopped labetalol in the past she had increased palpitations.         ROS: All other systems reviewed and are otherwise negative except as noted in History of Present Illness.  Studies Reviewed    EKG Interpretation Date/Time:  Thursday May 10 2023 14:31:18 EST Ventricular Rate:  49 PR Interval:  212 QRS  Duration:  80 QT Interval:  488 QTC Calculation: 440 R Axis:   9  Text Interpretation: Sinus bradycardia with 1st degree A-V block Low voltage QRS Nonspecific T wave abnormality When compared with ECG of 30-Jul-2021 21:02, PREVIOUS ECG IS PRESENTPrevious PR interval 205 ms. Confirmed by Carlos Levering [phone]) on 05/10/2023 2:42:17 PM       Physical Exam    VS:  BP 121/72 (BP Location: Left Arm, Patient Position: Sitting, Cuff Size: Normal)   Pulse (!) 49   Ht 5\' 4"  (1.626  m)   Wt 172 lb 1 oz (78 kg)   SpO2 99%   BMI 29.53 kg/m  , BMI Body mass index is 29.53 kg/m.  Orthostatic VS for the past 24 hrs (Last 3 readings):  BP- Lying Pulse- Lying BP- Sitting Pulse- Sitting BP- Standing at 0 minutes Pulse- Standing at 0 minutes BP- Standing at 3 minutes Pulse- Standing at 3 minutes  05/10/23 1434 132/82 53 132/77 55 128/83 61 143/90 60     GEN: Well nourished, well developed, in no acute distress. Neck: No JVD or carotid bruits. Cardiac:  RRR. No murmurs. No rubs or gallops.   Respiratory:  Respirations regular and unlabored. Clear to auscultation without rales, wheezing or rhonchi. GI: Soft, nontender, nondistended. Extremities: Radials/DP/PT 2+ and equal bilaterally. No clubbing or cyanosis. No edema.  Skin: Warm and dry, no rash. Neuro: Strength intact.  Assessment & Plan      Hypertension BP today 121/72. Currently on Labetalol 400mg  twice daily and Hydrochlorothiazide 12.5mg  daily. Recent significant weight loss of 50 pounds. Noted low heart rate of 49 and occasional positional dizziness. PCP wants her to stop labetalol and start Losartan. She reports increased palpitations when stopping labetalol in the past.  -Reduce Labetalol to 200mg  twice daily. -Check blood pressure two hours after taking medication. -Continue Hydrochlorothiazide 12.5mg  daily.  Weight loss Significant weight loss of 50 pounds due to Zepbound. Noted occasional nausea. She reports decreased appetite often forgetting to eat all day.  -Encouraged to set reminders to eat regularly. -Continue high protein snacks. -Gradually increase physical activity, considering Pilates or resistance exercises.  Palpitations Occasional palpitations described as skipped beats.  -Continue monitoring symptoms with reduced Labetalol dose.  Syncope/dizziness Patient with sinus pauses secondary to increased vagal tone during pregnancy.  No further episodes of syncope/dizziness postpartum.  Echo with  normal LV/RV function, normal diastolic parameters.  Patient continues with episodes of positional dizziness with multiple contributing factors likely. She is not orthostatic today. Patient has lost 50 lb. She has recently noted low heart rate. Today it is 49 bpm. She frequently goes all day without eating secondary to side effects from Zepbound. She also will not drink until she feels very thirsty.  -Continue to monitor. -Encouraged to set reminders to eat regularly.  -Decrease labetalol as above.         Disposition: Return in 1 year or sooner as needed.          Signed, Etta Grandchild. Reeda Soohoo, DNP, NP-C

## 2023-05-10 ENCOUNTER — Ambulatory Visit: Payer: BC Managed Care – PPO | Attending: Student | Admitting: Student

## 2023-05-10 ENCOUNTER — Encounter: Payer: Self-pay | Admitting: Student

## 2023-05-10 VITALS — BP 121/72 | HR 49 | Ht 64.0 in | Wt 172.1 lb

## 2023-05-10 DIAGNOSIS — R634 Abnormal weight loss: Secondary | ICD-10-CM

## 2023-05-10 DIAGNOSIS — I10 Essential (primary) hypertension: Secondary | ICD-10-CM | POA: Diagnosis not present

## 2023-05-10 DIAGNOSIS — R42 Dizziness and giddiness: Secondary | ICD-10-CM

## 2023-05-10 DIAGNOSIS — R002 Palpitations: Secondary | ICD-10-CM

## 2023-05-10 NOTE — Patient Instructions (Signed)
Medication Instructions:  No changes *If you need a refill on your cardiac medications before your next appointment, please call your pharmacy*   Lab Work: None ordered If you have labs (blood work) drawn today and your tests are completely normal, you will receive your results only by: MyChart Message (if you have MyChart) OR A paper copy in the mail If you have any lab test that is abnormal or we need to change your treatment, we will call you to review the results.   Testing/Procedures: None ordered   Follow-Up: At [facility], you and your health needs are our priority.  As part of our continuing mission to provide you with exceptional heart care, we have created designated Provider Care Teams.  These Care Teams include your primary Cardiologist (physician) and Advanced Practice Providers (APPs -  Physician Assistants and Nurse Practitioners) who all work together to provide you with the care you need, when you need it.  We recommend signing up for the patient portal called "MyChart".  Sign up information is provided on this After Visit Summary.  MyChart is used to connect with patients for Virtual Visits (Telemedicine).  Patients are able to view lab/test results, encounter notes, upcoming appointments, etc.  Non-urgent messages can be sent to your provider as well.   To learn more about what you can do with MyChart, go to [email or web address].    Your next appointment:   12 month(s)  Provider:   You may see Debbe Odea, MD or one of the following Advanced Practice Providers on your designated Care Team:   Nicolasa Ducking, NP Eula Listen, PA-C Cadence Fransico Michael, PA-C Charlsie Quest, NP Carlos Levering, NP

## 2023-05-15 ENCOUNTER — Ambulatory Visit: Payer: BC Managed Care – PPO | Admitting: Family Medicine

## 2023-05-15 ENCOUNTER — Encounter: Payer: Self-pay | Admitting: Family Medicine

## 2023-05-15 VITALS — BP 118/80 | HR 62 | Temp 98.3°F | Resp 16 | Ht 64.0 in | Wt 172.4 lb

## 2023-05-15 DIAGNOSIS — J0100 Acute maxillary sinusitis, unspecified: Secondary | ICD-10-CM | POA: Diagnosis not present

## 2023-05-15 DIAGNOSIS — R051 Acute cough: Secondary | ICD-10-CM | POA: Diagnosis not present

## 2023-05-15 LAB — POCT INFLUENZA A/B
Influenza A, POC: NEGATIVE
Influenza B, POC: NEGATIVE

## 2023-05-15 LAB — POCT COVID BINAXNOW CARD: SARS Coronavirus 2 Ag: NEGATIVE

## 2023-05-15 MED ORDER — AMOXICILLIN-POT CLAVULANATE 875-125 MG PO TABS
1.0000 | ORAL_TABLET | Freq: Two times a day (BID) | ORAL | 0 refills | Status: DC
Start: 2023-05-15 — End: 2023-07-11

## 2023-05-15 MED ORDER — BENZONATATE 100 MG PO CAPS
100.0000 mg | ORAL_CAPSULE | Freq: Three times a day (TID) | ORAL | 0 refills | Status: DC | PRN
Start: 2023-05-15 — End: 2023-07-11

## 2023-05-15 NOTE — Progress Notes (Signed)
Date:  05/15/2023   Name:  Brenda [name]   DOB:  14-Feb-1981   MRN:  [id number]   Chief Complaint: Cough (Runny nose, sore throat, chest tightness and nasal drainage. Denies fever. Son and husband sick all week and everyone negative for covid as of Wednesday when tested. )  Sinusitis This is a chronic problem. The problem has been gradually worsening since onset. There has been no fever. Associated symptoms include congestion, coughing, a hoarse voice and sinus pressure. Pertinent negatives include no chills, diaphoresis, ear pain, headaches, shortness of breath, sneezing, sore throat or swollen glands. Past treatments include nothing. The treatment provided mild relief.    Lab Results  Component Value Date   NA 138 08/03/2021   K 3.9 08/03/2021   CO2 25 08/03/2021   GLUCOSE 127 (H) 08/03/2021   BUN 6 08/03/2021   CREATININE 0.68 08/03/2021   CALCIUM 8.7 (L) 08/03/2021   GFRNONAA >60 08/03/2021   No results found for: "CHOL", "HDL", "LDLCALC", "LDLDIRECT", "TRIG", "CHOLHDL" Lab Results  Component Value Date   TSH 1.640 08/05/2020   No results found for: "HGBA1C" Lab Results  Component Value Date   WBC 14.7 (H) 08/03/2021   HGB 12.0 08/03/2021   HCT 38.1 08/03/2021   MCV 84.7 08/03/2021   PLT 310 08/03/2021   Lab Results  Component Value Date   ALT 112 (H) 08/03/2021   AST 37 08/03/2021   ALKPHOS 85 08/03/2021   BILITOT 0.3 08/03/2021   No results found for: "25OHVITD2", "25OHVITD3", "VD25OH"   Review of Systems  Constitutional:  Negative for chills and diaphoresis.  HENT:  Positive for congestion, hoarse voice, postnasal drip, rhinorrhea and sinus pressure. Negative for ear pain, nosebleeds, sneezing and sore throat.   Respiratory:  Positive for cough and wheezing. Negative for shortness of breath.   Cardiovascular:  Negative for chest pain, palpitations and leg swelling.  Gastrointestinal:  Negative for abdominal pain and blood in stool.  Neurological:   Negative for headaches.    Patient Active Problem List   Diagnosis Date Noted   Fitting and adjustment of gastrointestinal appliance and device    Anxiety 07/31/2021   Hypokalemia 07/31/2021   Choledocholithiasis with obstruction 07/31/2021   Bipolar disorder in full remission (HCC) 07/18/2021   Panic attacks 07/18/2021   History of ADHD 07/18/2021   Significant use of alcohol 07/18/2021   Postpartum care following vaginal delivery 03/17/2021   Chronic benign essential hypertension, antepartum 03/15/2021   Pre-existing essential hypertension during pregnancy, antepartum 08/05/2020   Gastroesophageal reflux disease 07/10/2019   Essential hypertension 07/10/2019   Performance anxiety 07/10/2019   Epigastric abdominal pain 03/10/2018   Rectal polyp    Heartburn    Nausea    Adult ADHD 12/07/2014    Allergies  Allergen Reactions   Ace Inhibitors Swelling    Lips/ face    Past Surgical History:  Procedure Laterality Date   BREAST REDUCTION SURGERY     CHOLECYSTECTOMY     COLONOSCOPY WITH PROPOFOL N/A 11/09/2016   Procedure: COLONOSCOPY WITH PROPOFOL;  Surgeon: [name] Minium, MD;  Location: [facility];  Service: Endoscopy;  Laterality: N/A;   ERCP N/A 08/01/2021   Procedure: ENDOSCOPIC RETROGRADE CHOLANGIOPANCREATOGRAPHY (ERCP);  Surgeon: [name] Minium, MD;  Location[facility] ENDOSCOPY;  Service: Endoscopy;  Laterality: N/A;   ERCP N/A 09/13/2021   Procedure: ENDOSCOPIC RETROGRADE CHOLANGIOPANCREATOGRAPHY (ERCP);  Surgeon: [name] Minium, MD;  Location: [facility] ENDOSCOPY;  Service: Endoscopy;  Laterality: N/A;  Stent removal   ESOPHAGOGASTRODUODENOSCOPY (  EGD) WITH PROPOFOL N/A 11/09/2016   Procedure: ESOPHAGOGASTRODUODENOSCOPY (EGD) WITH PROPOFOL;  Surgeon: [name] Minium, MD;  Location: [facility];  Service: Endoscopy;  Laterality: N/A;   TONSILLECTOMY      Social History   Tobacco Use   Smoking status: Never   Smokeless tobacco: Never  Vaping Use   Vaping status:  Never Used  Substance Use Topics   Alcohol use: Not Currently    Alcohol/week: 10.0 standard drinks of alcohol    Types: 5 Glasses of wine, 5 Cans of beer per week    Comment: not anymore   Drug use: No     Medication list has been reviewed and updated.  Current Meds  Medication Sig   ALPRAZolam (XANAX) 1 MG tablet Take 1 mg by mouth 3 (three) times daily as needed for anxiety.   amphetamine-dextroamphetamine (ADDERALL) 20 MG tablet Take 20 mg by mouth 2 (two) times daily.   CAPLYTA 42 MG capsule Take 42 mg by mouth at bedtime.   hydrochlorothiazide (HYDRODIURIL) 12.5 MG tablet TAKE 1 TABLET BY MOUTH EVERY DAY   labetalol (NORMODYNE) 200 MG tablet Take 200 mg by mouth 2 (two) times daily.   montelukast (SINGULAIR) 10 MG tablet TAKE 1 TABLET BY MOUTH EVERYDAY AT BEDTIME   pantoprazole (PROTONIX) 40 MG tablet TAKE 1 TABLET BY MOUTH EVERY DAY   tiZANidine (ZANAFLEX) 2 MG tablet Take 2 mg by mouth every 8 (eight) hours as needed.   traZODone (DESYREL) 100 MG tablet Take 200 mg by mouth at bedtime.   ZEPBOUND 10 MG/0.5ML Pen SMARTSIG:10 Milligram(s) SUB-Q Once a Week       05/15/2023   11:10 AM 05/05/2022    4:26 PM 07/18/2021    4:54 PM 07/18/2021    2:00 PM  GAD 7 : Generalized Anxiety Score  Nervous, Anxious, on Edge 1 0    Control/stop worrying 1 0    Worry too much - different things 1 0    Trouble relaxing 1 0    Restless 0 0    Easily annoyed or irritable 1 0    Afraid - awful might happen 0 0    Total GAD 7 Score 5 0    Anxiety Difficulty  Not difficult at all       Information is confidential and restricted. Go to Review Flowsheets to unlock data.       05/15/2023   11:09 AM 05/05/2022    4:26 PM 07/18/2021    4:53 PM  Depression screen PHQ 2/9  Decreased Interest 0 0   Down, Depressed, Hopeless 0 0   PHQ - 2 Score 0 0   Altered sleeping 1 0   Tired, decreased energy 1 0   Change in appetite 1 0   Feeling bad or failure about yourself  0 0   Trouble  concentrating 1 0   Moving slowly or fidgety/restless 0 0   Suicidal thoughts 0 0   PHQ-9 Score 4 0   Difficult doing work/chores  Not difficult at all      Information is confidential and restricted. Go to Review Flowsheets to unlock data.    BP Readings from Last 3 Encounters:  05/15/23 118/80  05/10/23 121/72  03/15/23 (!) 140/93    Physical Exam Vitals and nursing note reviewed. Exam conducted with a chaperone present.  Constitutional:      General: She is not in acute distress.    Appearance: She is not diaphoretic.  HENT:  Head: Normocephalic and atraumatic.     Right Ear: External ear normal.     Left Ear: External ear normal.     Nose: Nose normal.     Mouth/Throat:     Mouth: Mucous membranes are moist.  Eyes:     General:        Right eye: No discharge.        Left eye: No discharge.     Conjunctiva/sclera: Conjunctivae normal.     Pupils: Pupils are equal, round, and reactive to light.  Neck:     Thyroid: No thyromegaly.     Vascular: No JVD.  Cardiovascular:     Rate and Rhythm: Normal rate and regular rhythm.     Heart sounds: Normal heart sounds. No murmur heard.    No friction rub. No gallop.  Pulmonary:     Effort: Pulmonary effort is normal.     Breath sounds: Normal breath sounds. No wheezing, rhonchi or rales.  Abdominal:     General: Bowel sounds are normal.     Palpations: Abdomen is soft. There is no mass.     Tenderness: There is no abdominal tenderness. There is no guarding or rebound.  Musculoskeletal:        General: Normal range of motion.     Cervical back: Normal range of motion and neck supple.  Lymphadenopathy:     Cervical: No cervical adenopathy.  Skin:    General: Skin is warm and dry.     Coloration: Skin is not pale.  Neurological:     Mental Status: She is alert.     Deep Tendon Reflexes: Reflexes are normal and symmetric.     Wt Readings from Last 3 Encounters:  05/15/23 172 lb 6.4 oz (78.2 kg)  05/10/23 172 lb 1  oz (78 kg)  03/15/23 178 lb (80.7 kg)    BP 118/80   Pulse 62   Temp 98.3 F (36.8 C) (Oral)   Resp 16   Ht 5\' 4"  (1.626 m)   Wt 172 lb 6.4 oz (78.2 kg)   LMP 05/02/2023 (Approximate)   SpO2 99%   BMI 29.59 kg/m   Assessment and Plan:  1. Acute maxillary sinusitis, recurrence not specified New onset.  Persistent.  Stable but ongoing.  COVID and influenza is unremarkable.  Patient symptomatically and on evaluation is consistent with maxillary sinusitis.  Will treat with Augmentin 875 mg twice a day. - POCT Influenza A/B - POCT COVID BINAX NOW CARD - amoxicillin-clavulanate (AUGMENTIN) 875-125 MG tablet; Take 1 tablet by mouth 2 (two) times daily.  Dispense: 20 tablet; Refill: 0  2. Acute cough Patient has been encouraged to use a mucolytic agent such as Mucinex and suppress the cough will go with Tessalon Perles 100 mg as needed. - benzonatate (TESSALON PERLES) 100 MG capsule; Take 1 capsule (100 mg total) by mouth 3 (three) times daily as needed for cough.  Dispense: 20 capsule; Refill: 0    Elizabeth Sauer, MD

## 2023-05-24 ENCOUNTER — Other Ambulatory Visit: Payer: Self-pay | Admitting: Family Medicine

## 2023-05-24 DIAGNOSIS — J4521 Mild intermittent asthma with (acute) exacerbation: Secondary | ICD-10-CM

## 2023-05-24 DIAGNOSIS — I10 Essential (primary) hypertension: Secondary | ICD-10-CM

## 2023-07-11 ENCOUNTER — Ambulatory Visit: Payer: BC Managed Care – PPO | Admitting: Physician Assistant

## 2023-07-11 ENCOUNTER — Encounter: Payer: Self-pay | Admitting: Physician Assistant

## 2023-07-11 VITALS — BP 102/76 | HR 73 | Temp 98.0°F | Ht 64.0 in | Wt 159.0 lb

## 2023-07-11 DIAGNOSIS — J069 Acute upper respiratory infection, unspecified: Secondary | ICD-10-CM | POA: Diagnosis not present

## 2023-07-11 LAB — POCT INFLUENZA A/B
Influenza A, POC: NEGATIVE
Influenza B, POC: NEGATIVE

## 2023-07-11 LAB — POC COVID19 BINAXNOW: SARS Coronavirus 2 Ag: NEGATIVE

## 2023-07-11 MED ORDER — BENZONATATE 200 MG PO CAPS
200.0000 mg | ORAL_CAPSULE | Freq: Three times a day (TID) | ORAL | 0 refills | Status: AC | PRN
Start: 2023-07-11 — End: ?

## 2023-07-11 NOTE — Patient Instructions (Signed)
 -  It was a pleasure to see you today! Please review your visit summary for helpful information -I would encourage you to follow your care via MyChart where you can access lab results, notes, messages, and more -If you feel that we did a nice job today, please complete your after-visit survey and leave [place] a Google review! Your CMA today was [place] and your provider was Alvester Morin, PA-C, DMSc

## 2023-07-11 NOTE — Progress Notes (Signed)
 Date:  07/11/2023   Name:  Brenda [name]   DOB:  06-02-1981   MRN:  [id number]   Chief Complaint: Sinus Problem (Child had sinus infection and ear infection )  Sinus Problem This is a new problem. The current episode started yesterday. The problem has been gradually worsening since onset. There has been no fever. Her pain is at a severity of 4/10. The pain is mild. Associated symptoms include chills, congestion, coughing, ear pain, headaches, sinus pressure and sneezing. Past treatments include acetaminophen . The treatment provided mild relief.   Brenda [name]  is a pleasant 43 year old female new to me today for evaluation of acute URI symptoms as above for the past 24 hours.  She has a 87-year-old child with similar symptoms, currently being treated with antibiotics.   Medication list has been reviewed and updated.  Current Meds  Medication Sig   ALPRAZolam  (XANAX ) 1 MG tablet Take 1 mg by mouth 3 (three) times daily as needed for anxiety.   amphetamine -dextroamphetamine  (ADDERALL ) 20 MG tablet Take 20 mg by mouth 2 (two) times daily.   benzonatate  (TESSALON ) 200 MG capsule Take 1 capsule (200 mg total) by mouth 3 (three) times daily as needed for cough.   CAPLYTA 42 MG capsule Take 42 mg by mouth at bedtime.   hydrochlorothiazide  (HYDRODIURIL ) 12.5 MG tablet TAKE 1 TABLET BY MOUTH EVERY DAY   labetalol  (NORMODYNE ) 200 MG tablet Take 200 mg by mouth 2 (two) times daily.   montelukast  (SINGULAIR ) 10 MG tablet TAKE 1 TABLET BY MOUTH EVERYDAY AT BEDTIME   pantoprazole  (PROTONIX ) 40 MG tablet TAKE 1 TABLET BY MOUTH EVERY DAY   tirzepatide (ZEPBOUND) 12.5 MG/0.5ML Pen    tiZANidine (ZANAFLEX) 2 MG tablet Take 2 mg by mouth every 8 (eight) hours as needed.   traZODone  (DESYREL ) 100 MG tablet Take 200 mg by mouth at bedtime.   [DISCONTINUED] benzonatate  (TESSALON  PERLES) 100 MG capsule Take 1 capsule (100 mg total) by mouth 3 (three) times daily as needed for cough.   [DISCONTINUED]  sucralfate  (CARAFATE ) 1 g tablet Take 1 tablet by mouth 4 (four) times daily.     Review of Systems  Constitutional:  Positive for chills.  HENT:  Positive for congestion, ear pain, sinus pressure and sneezing.   Respiratory:  Positive for cough.   Neurological:  Positive for headaches.    Patient Active Problem List   Diagnosis Date Noted   Fitting and adjustment of gastrointestinal appliance and device    Anxiety 07/31/2021   Hypokalemia 07/31/2021   Choledocholithiasis with obstruction 07/31/2021   Bipolar disorder in full remission (HCC) 07/18/2021   Panic attacks 07/18/2021   History of ADHD 07/18/2021   Significant use of alcohol 07/18/2021   Postpartum care following vaginal delivery 03/17/2021   Chronic benign essential hypertension, antepartum 03/15/2021   Pre-existing essential hypertension during pregnancy, antepartum 08/05/2020   Gastroesophageal reflux disease 07/10/2019   Essential hypertension 07/10/2019   Performance anxiety 07/10/2019   Epigastric abdominal pain 03/10/2018   Rectal polyp    Heartburn    Nausea    Adult ADHD 12/07/2014    Allergies  Allergen Reactions   Ace Inhibitors Swelling    Lips/ face    Immunization History  Administered Date(s) Administered   Influenza,inj,Quad PF,6+ Mos 03/20/2018, 07/10/2019, 08/26/2020   PFIZER(Purple Top)SARS-COV-2 Vaccination 08/21/2019, 09/17/2019, 03/23/2020   Tdap 02/15/2021    Past Surgical History:  Procedure Laterality Date   BREAST REDUCTION SURGERY     CHOLECYSTECTOMY  COLONOSCOPY WITH PROPOFOL  N/A 11/09/2016   Procedure: COLONOSCOPY WITH PROPOFOL ;  Surgeon: Marnee Sink, MD;  Location: [facility];  Service: Endoscopy;  Laterality: N/A;   ERCP N/A 08/01/2021   Procedure: ENDOSCOPIC RETROGRADE CHOLANGIOPANCREATOGRAPHY (ERCP);  Surgeon: Marnee Sink, MD;  Location: [facility] ENDOSCOPY;  Service: Endoscopy;  Laterality: N/A;   ERCP N/A 09/13/2021   Procedure: ENDOSCOPIC RETROGRADE  CHOLANGIOPANCREATOGRAPHY (ERCP);  Surgeon: Marnee Sink, MD;  Location: [facility] ENDOSCOPY;  Service: Endoscopy;  Laterality: N/A;  Stent removal   ESOPHAGOGASTRODUODENOSCOPY (EGD) WITH PROPOFOL  N/A 11/09/2016   Procedure: ESOPHAGOGASTRODUODENOSCOPY (EGD) WITH PROPOFOL ;  Surgeon: Marnee Sink, MD;  Location: [facility];  Service: Endoscopy;  Laterality: N/A;   TONSILLECTOMY      Social History   Tobacco Use   Smoking status: Never   Smokeless tobacco: Never  Vaping Use   Vaping status: Never Used  Substance Use Topics   Alcohol use: Not Currently    Alcohol/week: 10.0 standard drinks of alcohol    Types: 5 Glasses of wine, 5 Cans of beer per week    Comment: not anymore   Drug use: No    Family History  Problem Relation Age of Onset   Hypertension Mother    Hypertension Father    Anxiety disorder Sister    Bipolar disorder Sister    Anxiety disorder Sister    Heart disease Maternal Grandfather    Heart disease Maternal Grandmother    Diabetes Paternal Grandfather         07/11/2023   11:14 AM 05/15/2023   11:10 AM 05/05/2022    4:26 PM 07/18/2021    4:54 PM  GAD 7 : Generalized Anxiety Score  Nervous, Anxious, on Edge 3 1 0   Control/stop worrying 2 1 0   Worry too much - different things 2 1 0   Trouble relaxing 2 1 0   Restless 1 0 0   Easily annoyed or irritable 2 1 0   Afraid - awful might happen 2 0 0   Total GAD 7 Score 14 5 0   Anxiety Difficulty Somewhat difficult  Not difficult at all      Information is confidential and restricted. Go to Review Flowsheets to unlock data.       07/11/2023   11:14 AM 05/15/2023   11:09 AM 05/05/2022    4:26 PM  Depression screen PHQ 2/9  Decreased Interest 0 0 0  Down, Depressed, Hopeless 0 0 0  PHQ - 2 Score 0 0 0  Altered sleeping 2 1 0  Tired, decreased energy 1 1 0  Change in appetite 1 1 0  Feeling bad or failure about yourself  0 0 0  Trouble concentrating 2 1 0  Moving slowly or fidgety/restless 0 0  0  Suicidal thoughts 0 0 0  PHQ-9 Score 6 4 0  Difficult doing work/chores   Not difficult at all    BP Readings from Last 3 Encounters:  07/11/23 102/76  05/15/23 118/80  05/10/23 121/72    Wt Readings from Last 3 Encounters:  07/11/23 159 lb (72.1 kg)  05/15/23 172 lb 6.4 oz (78.2 kg)  05/10/23 172 lb 1 oz (78 kg)    BP 102/76   Pulse 73   Temp 98 F (36.7 C)   Ht 5\' 4"  (1.626 m)   Wt 159 lb (72.1 kg)   SpO2 98%   BMI 27.29 kg/m   Physical Exam Vitals and nursing note reviewed.  Constitutional:  General: She is not in acute distress.    Appearance: Normal appearance.  HENT:     Right Ear: There is impacted cerumen.     Left Ear: There is impacted cerumen.     Ears:     Comments: TM could not be visualized due to narrow EAC with soft cerumen impaction obstructing view.    Nose: Nose normal.     Mouth/Throat:     Mouth: Mucous membranes are moist.     Pharynx: No oropharyngeal exudate or posterior oropharyngeal erythema.  Eyes:     Conjunctiva/sclera: Conjunctivae normal.     Pupils: Pupils are equal, round, and reactive to light.  Cardiovascular:     Rate and Rhythm: Normal rate and regular rhythm.     Heart sounds: No murmur heard.    No friction rub. No gallop.  Pulmonary:     Effort: Pulmonary effort is normal.     Breath sounds: Normal breath sounds. No wheezing, rhonchi or rales.  Lymphadenopathy:     Cervical: No cervical adenopathy.     Recent Labs     Component Value Date/Time   NA 138 08/03/2021 0402   NA 138 08/05/2020 1202   K 3.9 08/03/2021 0402   CL 109 08/03/2021 0402   CO2 25 08/03/2021 0402   GLUCOSE 127 (H) 08/03/2021 0402   BUN 6 08/03/2021 0402   BUN 12 08/05/2020 1202   CREATININE 0.68 08/03/2021 0402   CALCIUM 8.7 (L) 08/03/2021 0402   PROT 6.4 (L) 08/03/2021 0402   PROT 7.6 08/05/2020 1202   ALBUMIN 3.2 (L) 08/03/2021 0402   ALBUMIN 4.6 08/05/2020 1202   AST 37 08/03/2021 0402   ALT 112 (H) 08/03/2021 0402    ALKPHOS 85 08/03/2021 0402   BILITOT 0.3 08/03/2021 0402   BILITOT 0.3 08/05/2020 1202   GFRNONAA >60 08/03/2021 0402   GFRAA 127 08/05/2020 1202    Lab Results  Component Value Date   WBC 14.7 (H) 08/03/2021   HGB 12.0 08/03/2021   HCT 38.1 08/03/2021   MCV 84.7 08/03/2021   PLT 310 08/03/2021   No results found for: "HGBA1C" No results found for: "CHOL", "HDL", "LDLCALC", "LDLDIRECT", "TRIG", "CHOLHDL" Lab Results  Component Value Date   TSH 1.640 08/05/2020     Assessment and Plan:  1. Acute URI (Primary) Likely viral etiology.  COVID/Flu negative today.  Discussed self-limited nature of viral illnesses and advised conservative measures including rest, fluids, honey, and OTC cough/cold medications. Reviewed anecdotal evidence for zinc lozenges to reduce viral replication when used at the onset of symptoms.   Contact precautions advised to limit spread. Encouraged mask wearing and good hand hygiene especially before meals. Call if acutely worsening symptoms or if no improvement after Day 7 of illness  Prescribing benzonatate  today, which patient says works very well for her historically  - POCT Influenza A/B - POC COVID-19 - benzonatate  (TESSALON ) 200 MG capsule; Take 1 capsule (200 mg total) by mouth 3 (three) times daily as needed for cough.  Dispense: 30 capsule; Refill: 0   No follow-ups on file.    Cody Das, PA-C, DMSc, Nutritionist [facility] [phone]

## 2023-09-01 ENCOUNTER — Other Ambulatory Visit: Payer: Self-pay | Admitting: Family Medicine

## 2023-09-01 DIAGNOSIS — I10 Essential (primary) hypertension: Secondary | ICD-10-CM

## 2023-10-30 ENCOUNTER — Other Ambulatory Visit: Payer: Self-pay | Admitting: Family Medicine

## 2023-10-30 DIAGNOSIS — I10 Essential (primary) hypertension: Secondary | ICD-10-CM
